# Patient Record
Sex: Female | Born: 1955 | Race: Black or African American | Hispanic: No | Marital: Married | Smoking: Never smoker
Health system: Southern US, Community
[De-identification: ages and names within clinical notes are randomized; demographics above are authoritative.]

## PROBLEM LIST (undated history)

## (undated) DIAGNOSIS — R51 Headache: Secondary | ICD-10-CM

## (undated) DIAGNOSIS — R011 Cardiac murmur, unspecified: Secondary | ICD-10-CM

## (undated) DIAGNOSIS — M549 Dorsalgia, unspecified: Secondary | ICD-10-CM

## (undated) DIAGNOSIS — M62838 Other muscle spasm: Secondary | ICD-10-CM

## (undated) DIAGNOSIS — R519 Headache, unspecified: Secondary | ICD-10-CM

## (undated) DIAGNOSIS — G8929 Other chronic pain: Secondary | ICD-10-CM

## (undated) HISTORY — PX: ABDOMINAL HYSTERECTOMY: SHX81

## (undated) HISTORY — DX: Dorsalgia, unspecified: M54.9

## (undated) HISTORY — DX: Other chronic pain: G89.29

## (undated) HISTORY — DX: Other muscle spasm: M62.838

## (undated) HISTORY — PX: BREAST CYST ASPIRATION: SHX578

## (undated) HISTORY — DX: Headache: R51

## (undated) HISTORY — DX: Headache, unspecified: R51.9

---

## 1998-07-27 ENCOUNTER — Other Ambulatory Visit: Admission: RE | Admit: 1998-07-27 | Discharge: 1998-07-27 | Payer: Self-pay | Admitting: Obstetrics and Gynecology

## 1998-12-09 ENCOUNTER — Encounter: Payer: Self-pay | Admitting: Internal Medicine

## 1998-12-09 ENCOUNTER — Ambulatory Visit (HOSPITAL_COMMUNITY): Admission: RE | Admit: 1998-12-09 | Discharge: 1998-12-09 | Payer: Self-pay | Admitting: Internal Medicine

## 1999-06-24 ENCOUNTER — Emergency Department (HOSPITAL_COMMUNITY): Admission: EM | Admit: 1999-06-24 | Discharge: 1999-06-24 | Payer: Self-pay | Admitting: Emergency Medicine

## 1999-06-25 ENCOUNTER — Encounter: Payer: Self-pay | Admitting: Emergency Medicine

## 1999-08-23 ENCOUNTER — Ambulatory Visit (HOSPITAL_COMMUNITY): Admission: RE | Admit: 1999-08-23 | Discharge: 1999-08-23 | Payer: Self-pay | Admitting: Obstetrics and Gynecology

## 1999-08-23 ENCOUNTER — Encounter: Payer: Self-pay | Admitting: Obstetrics and Gynecology

## 1999-08-30 ENCOUNTER — Other Ambulatory Visit: Admission: RE | Admit: 1999-08-30 | Discharge: 1999-08-30 | Payer: Self-pay | Admitting: Obstetrics and Gynecology

## 2000-09-11 ENCOUNTER — Other Ambulatory Visit: Admission: RE | Admit: 2000-09-11 | Discharge: 2000-09-11 | Payer: Self-pay | Admitting: Obstetrics and Gynecology

## 2000-10-21 ENCOUNTER — Emergency Department (HOSPITAL_COMMUNITY): Admission: EM | Admit: 2000-10-21 | Discharge: 2000-10-21 | Payer: Self-pay | Admitting: Emergency Medicine

## 2000-10-23 ENCOUNTER — Ambulatory Visit (HOSPITAL_COMMUNITY): Admission: RE | Admit: 2000-10-23 | Discharge: 2000-10-23 | Payer: Self-pay | Admitting: Obstetrics and Gynecology

## 2000-10-23 ENCOUNTER — Encounter: Payer: Self-pay | Admitting: Obstetrics and Gynecology

## 2000-10-25 ENCOUNTER — Encounter: Payer: Self-pay | Admitting: Obstetrics and Gynecology

## 2000-10-25 ENCOUNTER — Encounter: Admission: RE | Admit: 2000-10-25 | Discharge: 2000-10-25 | Payer: Self-pay | Admitting: Obstetrics and Gynecology

## 2001-01-22 ENCOUNTER — Encounter (INDEPENDENT_AMBULATORY_CARE_PROVIDER_SITE_OTHER): Payer: Self-pay | Admitting: *Deleted

## 2001-01-22 ENCOUNTER — Encounter: Payer: Self-pay | Admitting: Obstetrics and Gynecology

## 2001-01-22 ENCOUNTER — Other Ambulatory Visit: Admission: RE | Admit: 2001-01-22 | Discharge: 2001-01-22 | Payer: Self-pay | Admitting: Obstetrics and Gynecology

## 2001-01-22 ENCOUNTER — Encounter: Admission: RE | Admit: 2001-01-22 | Discharge: 2001-01-22 | Payer: Self-pay | Admitting: Obstetrics and Gynecology

## 2001-10-01 ENCOUNTER — Other Ambulatory Visit: Admission: RE | Admit: 2001-10-01 | Discharge: 2001-10-01 | Payer: Self-pay | Admitting: Obstetrics and Gynecology

## 2001-10-01 ENCOUNTER — Encounter: Payer: Self-pay | Admitting: Obstetrics and Gynecology

## 2001-10-01 ENCOUNTER — Encounter: Admission: RE | Admit: 2001-10-01 | Discharge: 2001-10-01 | Payer: Self-pay | Admitting: Obstetrics and Gynecology

## 2003-01-06 ENCOUNTER — Ambulatory Visit (HOSPITAL_COMMUNITY): Admission: RE | Admit: 2003-01-06 | Discharge: 2003-01-06 | Payer: Self-pay | Admitting: Family Medicine

## 2004-11-29 ENCOUNTER — Encounter: Admission: RE | Admit: 2004-11-29 | Discharge: 2004-11-29 | Payer: Self-pay | Admitting: Obstetrics and Gynecology

## 2005-10-19 ENCOUNTER — Ambulatory Visit (HOSPITAL_COMMUNITY): Admission: RE | Admit: 2005-10-19 | Discharge: 2005-10-19 | Payer: Self-pay | Admitting: Family Medicine

## 2007-01-31 ENCOUNTER — Ambulatory Visit (HOSPITAL_COMMUNITY): Admission: RE | Admit: 2007-01-31 | Discharge: 2007-01-31 | Payer: Self-pay | Admitting: Family Medicine

## 2008-02-12 ENCOUNTER — Ambulatory Visit (HOSPITAL_COMMUNITY): Admission: RE | Admit: 2008-02-12 | Discharge: 2008-02-12 | Payer: Self-pay | Admitting: Family Medicine

## 2009-01-28 ENCOUNTER — Ambulatory Visit (HOSPITAL_COMMUNITY): Admission: RE | Admit: 2009-01-28 | Discharge: 2009-01-28 | Payer: Self-pay | Admitting: Family Medicine

## 2010-01-06 ENCOUNTER — Emergency Department (HOSPITAL_COMMUNITY): Admission: EM | Admit: 2010-01-06 | Discharge: 2010-01-06 | Payer: Self-pay | Admitting: Emergency Medicine

## 2010-02-12 ENCOUNTER — Ambulatory Visit (HOSPITAL_COMMUNITY): Admission: RE | Admit: 2010-02-12 | Discharge: 2010-02-12 | Payer: Self-pay | Admitting: Family Medicine

## 2010-11-14 ENCOUNTER — Encounter: Payer: Self-pay | Admitting: Family Medicine

## 2011-05-12 ENCOUNTER — Emergency Department (HOSPITAL_COMMUNITY): Payer: 59

## 2011-05-12 ENCOUNTER — Emergency Department (HOSPITAL_COMMUNITY)
Admission: EM | Admit: 2011-05-12 | Discharge: 2011-05-12 | Disposition: A | Discharge status: Home / Self Care | Payer: 59 | Attending: Emergency Medicine | Admitting: Emergency Medicine

## 2011-05-12 DIAGNOSIS — R079 Chest pain, unspecified: Secondary | ICD-10-CM | POA: Insufficient documentation

## 2011-05-12 LAB — DIFFERENTIAL
Band Neutrophils: 0 % (ref 0–10)
Basophils Absolute: 0 10*3/uL (ref 0.0–0.1)
Basophils Relative: 0 % (ref 0–1)
Blasts: 0 %
Eosinophils Absolute: 0.1 10*3/uL (ref 0.0–0.7)
Eosinophils Relative: 1 % (ref 0–5)
Lymphocytes Relative: 38 % (ref 12–46)
Lymphs Abs: 2.4 10*3/uL (ref 0.7–4.0)
Metamyelocytes Relative: 0 %
Monocytes Absolute: 0.5 10*3/uL (ref 0.1–1.0)
Monocytes Relative: 8 % (ref 3–12)
Myelocytes: 0 %
Neutro Abs: 3.4 10*3/uL (ref 1.7–7.7)
Neutrophils Relative %: 53 % (ref 43–77)
Promyelocytes Absolute: 0 %
nRBC: 0 /100 WBC

## 2011-05-12 LAB — COMPREHENSIVE METABOLIC PANEL
ALT: 16 U/L (ref 0–35)
AST: 16 U/L (ref 0–37)
Albumin: 4.1 g/dL (ref 3.5–5.2)
Alkaline Phosphatase: 66 U/L (ref 39–117)
BUN: 23 mg/dL (ref 6–23)
CO2: 30 mEq/L (ref 19–32)
Calcium: 10 mg/dL (ref 8.4–10.5)
Chloride: 104 mEq/L (ref 96–112)
Creatinine, Ser: 0.77 mg/dL (ref 0.50–1.10)
GFR calc Af Amer: >60 mL/min (ref >60)
GFR calc non Af Amer: >60 mL/min (ref >60)
Glucose, Bld: 86 mg/dL (ref 70–99)
Potassium: 4.3 mEq/L (ref 3.5–5.1)
Sodium: 142 mEq/L (ref 135–145)
Total Bilirubin: 0.2 mg/dL — ABNORMAL LOW (ref 0.3–1.2)
Total Protein: 7.2 g/dL (ref 6.0–8.3)

## 2011-05-12 LAB — CBC
HCT: 41.3 % (ref 36.0–46.0)
Hemoglobin: 14.2 g/dL (ref 12.0–15.0)
MCH: 30.3 pg (ref 26.0–34.0)
MCHC: 34.4 g/dL (ref 30.0–36.0)
MCV: 88.2 fL (ref 78.0–100.0)
Platelets: 185 10*3/uL (ref 150–400)
RBC: 4.68 MIL/uL (ref 3.87–5.11)
RDW: 13.2 % (ref 11.5–15.5)
WBC: 6.4 10*3/uL (ref 4.0–10.5)

## 2011-05-12 LAB — TROPONIN I: Troponin I: <0.3 ng/mL (ref <0.30)

## 2011-05-12 LAB — CK TOTAL AND CKMB (NOT AT ARMC)
CK, MB: 2 ng/mL (ref 0.3–4.0)
Relative Index: 1.6 (ref 0.0–2.5)
Total CK: 124 U/L (ref 7–177)

## 2011-10-06 ENCOUNTER — Other Ambulatory Visit (HOSPITAL_COMMUNITY)
Admission: RE | Admit: 2011-10-06 | Discharge: 2011-10-06 | Disposition: A | Discharge status: Home / Self Care | Payer: 59 | Source: Ambulatory Visit | Attending: Family Medicine | Admitting: Family Medicine

## 2011-10-06 DIAGNOSIS — R87810 Cervical high risk human papillomavirus (HPV) DNA test positive: Secondary | ICD-10-CM | POA: Insufficient documentation

## 2011-10-06 DIAGNOSIS — Z01419 Encounter for gynecological examination (general) (routine) without abnormal findings: Secondary | ICD-10-CM | POA: Insufficient documentation

## 2012-02-18 ENCOUNTER — Emergency Department (HOSPITAL_COMMUNITY)
Admission: EM | Admit: 2012-02-18 | Discharge: 2012-02-18 | Disposition: A | Discharge status: Home / Self Care | Payer: 59 | Attending: Emergency Medicine | Admitting: Emergency Medicine

## 2012-02-18 ENCOUNTER — Encounter (HOSPITAL_COMMUNITY): Payer: Self-pay | Admitting: Physical Medicine and Rehabilitation

## 2012-02-18 ENCOUNTER — Emergency Department (HOSPITAL_COMMUNITY): Payer: 59

## 2012-02-18 DIAGNOSIS — R079 Chest pain, unspecified: Secondary | ICD-10-CM | POA: Insufficient documentation

## 2012-02-18 DIAGNOSIS — R011 Cardiac murmur, unspecified: Secondary | ICD-10-CM | POA: Insufficient documentation

## 2012-02-18 DIAGNOSIS — R0602 Shortness of breath: Secondary | ICD-10-CM | POA: Insufficient documentation

## 2012-02-18 HISTORY — DX: Cardiac murmur, unspecified: R01.1

## 2012-02-18 LAB — CBC
HCT: 38.3 % (ref 36.0–46.0)
Hemoglobin: 13.2 g/dL (ref 12.0–15.0)
MCH: 30.6 pg (ref 26.0–34.0)
MCHC: 34.5 g/dL (ref 30.0–36.0)
MCV: 88.7 fL (ref 78.0–100.0)
Platelets: 184 10*3/uL (ref 150–400)
RBC: 4.32 MIL/uL (ref 3.87–5.11)
RDW: 13.5 % (ref 11.5–15.5)
WBC: 6.8 10*3/uL (ref 4.0–10.5)

## 2012-02-18 LAB — BASIC METABOLIC PANEL
BUN: 18 mg/dL (ref 6–23)
CO2: 28 mEq/L (ref 19–32)
Calcium: 9.8 mg/dL (ref 8.4–10.5)
Chloride: 103 mEq/L (ref 96–112)
Creatinine, Ser: 0.84 mg/dL (ref 0.50–1.10)
GFR calc Af Amer: 89 mL/min — ABNORMAL LOW (ref >90)
GFR calc non Af Amer: 77 mL/min — ABNORMAL LOW (ref >90)
Glucose, Bld: 96 mg/dL (ref 70–99)
Potassium: 4.3 mEq/L (ref 3.5–5.1)
Sodium: 139 mEq/L (ref 135–145)

## 2012-02-18 LAB — POCT I-STAT TROPONIN I
Troponin i, poc: 0 ng/mL (ref 0.00–0.08)
Troponin i, poc: 0 ng/mL (ref 0.00–0.08)

## 2012-02-18 MED ORDER — GI COCKTAIL ~~LOC~~
30.0000 mL | Freq: Once | ORAL | Status: AC
  Administered 2012-02-18: 30 mL via ORAL
  Filled 2012-02-18: qty 30

## 2012-02-18 MED ORDER — ASPIRIN 81 MG PO CHEW
324.0000 mg | CHEWABLE_TABLET | Freq: Once | ORAL | Status: AC
  Administered 2012-02-18: 243 mg via ORAL
  Filled 2012-02-18: qty 4

## 2012-02-18 NOTE — ED Notes (Signed)
Patient transported to X-ray 

## 2012-02-18 NOTE — ED Provider Notes (Signed)
History     CSN: [id number]  Arrival date & time 02/18/12  1657   First MD Initiated Contact with Patient 02/18/12 1807      Chief Complaint  Patient presents with  . Chest Pain  . Shortness of Breath    (Consider location/radiation/quality/duration/timing/severity/associated sxs/prior treatment) Patient is a 56 y.o. female presenting with chest pain. The history is provided by the patient and a significant other.  Chest Pain The chest pain began 6 - 12 hours ago. Chest pain occurs frequently. The chest pain is resolved (no pain currently, but numerous episodes while in ED). Associated with: no aggravating factors. The pain is currently at 0/10. The quality of the pain is described as brief, sharp and dull. The pain does not radiate. Exacerbated by: no aggravating factors, also no alleviating factors. Primary symptoms include shortness of breath and nausea. Pertinent negatives for primary symptoms include no fever, no fatigue, no syncope, no cough, no palpitations, no abdominal pain, no vomiting and no dizziness.  Pertinent negatives for associated symptoms include no lower extremity edema and no orthopnea. She tried nothing for the symptoms. Risk factors include no known risk factors.  Pertinent negatives for past medical history include no CAD, no diabetes, no DVT, no hypertension and no MI.  Pertinent negatives for family medical history include: no early MI in family.     Past Medical History  Diagnosis Date  . Murmur     No past surgical history on file.  No family history on file.  History  Substance Use Topics  . Smoking status: Never Smoker   . Smokeless tobacco: Not on file  . Alcohol Use: No    OB History    Grav Para Term Preterm Abortions TAB SAB Ect Mult Living                  Review of Systems  Constitutional: Negative for fever and fatigue.  HENT: Negative for neck pain.   Respiratory: Positive for shortness of breath. Negative for cough.     Cardiovascular: Positive for chest pain. Negative for palpitations, orthopnea and syncope.  Gastrointestinal: Positive for nausea. Negative for vomiting and abdominal pain.  Neurological: Negative for dizziness.  All other systems reviewed and are negative.    Allergies  Review of patient's allergies indicates no known allergies.  Home Medications   Current Outpatient Rx  Name Route Sig Dispense Refill  . ASPIRIN EC 81 MG PO TBEC Oral Take 81 mg by mouth daily.    Marland Kitchen VITAMIN D-3 5000 UNITS PO TABS Oral Take 2 tablets by mouth daily.    Marland Kitchen CO-ENZYME Q-10 50 MG PO CAPS Oral Take 50 mg by mouth daily.    Marland Kitchen ECHINACEA PO Oral Take 1 tablet by mouth daily.    . OMEGA-3 FATTY ACIDS 1000 MG PO CAPS Oral Take 2 g by mouth daily.    . ADULT MULTIVITAMIN W/MINERALS CH Oral Take 1 tablet by mouth daily.      BP 123/77  Pulse 69  Temp(Src) 98.4 F (36.9 C) (Oral)  Resp 18  SpO2 99%  Physical Exam  Nursing note and vitals reviewed. Constitutional: She is oriented to person, place, and time. She appears well-developed and well-nourished.  HENT:  Head: Normocephalic and atraumatic.  Eyes: EOM are normal.  Neck: Normal range of motion.  Cardiovascular: Normal rate, regular rhythm, normal heart sounds and intact distal pulses.   Pulmonary/Chest: Effort normal and breath sounds normal. No respiratory distress.  Abdominal: Soft.  There is no tenderness.  Musculoskeletal: Normal range of motion. She exhibits no edema and no tenderness.  Neurological: She is alert and oriented to person, place, and time.  Skin: Skin is warm and dry.  Psychiatric: She has a normal mood and affect.    ED Course  Procedures (including critical care time)  Date: 02/18/2012  Rate: 70  Rhythm: normal sinus rhythm  QRS Axis: normal  Intervals: normal  ST/T Wave abnormalities: normal  Conduction Disutrbances:none  Narrative Interpretation:   Old EKG Reviewed: unchanged   Date: 02/18/2012, 2136  Rate:64   Rhythm: normal sinus rhythm  QRS Axis: normal  Intervals: normal  ST/T Wave abnormalities: normal  Conduction Disutrbances:none  Narrative Interpretation:   Old EKG Reviewed: unchanged    Results for orders placed during the hospital encounter of 02/18/12  CBC      Component Value Range   WBC 6.8  4.0 - 10.5 (K/uL)   RBC 4.32  3.87 - 5.11 (MIL/uL)   Hemoglobin 13.2  12.0 - 15.0 (g/dL)   HCT 16.1  09.6 - 04.5 (%)   MCV 88.7  78.0 - 100.0 (fL)   MCH 30.6  26.0 - 34.0 (pg)   MCHC 34.5  30.0 - 36.0 (g/dL)   RDW 40.9  81.1 - 91.4 (%)   Platelets 184  150 - 400 (K/uL)  BASIC METABOLIC PANEL      Component Value Range   Sodium 139  135 - 145 (mEq/L)   Potassium 4.3  3.5 - 5.1 (mEq/L)   Chloride 103  96 - 112 (mEq/L)   CO2 28  19 - 32 (mEq/L)   Glucose, Bld 96  70 - 99 (mg/dL)   BUN 18  6 - 23 (mg/dL)   Creatinine, Ser 7.82  0.50 - 1.10 (mg/dL)   Calcium 9.8  8.4 - 95.6 (mg/dL)   GFR calc non Af Amer 77 (*) >90 (mL/min)   GFR calc Af Amer 89 (*) >90 (mL/min)  POCT I-STAT TROPONIN I      Component Value Range   Troponin i, poc 0.00  0.00 - 0.08 (ng/mL)   Comment 3           POCT I-STAT TROPONIN I      Component Value Range   Troponin i, poc 0.00  0.00 - 0.08 (ng/mL)   Comment 3             Labs Reviewed  BASIC METABOLIC PANEL - Abnormal; Notable for the following:    GFR calc non Af Amer 77 (*)    GFR calc Af Amer 89 (*)    All other components within normal limits  CBC  POCT I-STAT TROPONIN I  POCT I-STAT TROPONIN I  .Dg Chest 2 View  02/18/2012  *RADIOLOGY REPORT*  Clinical Data: Chest pain since the morning  CHEST - 2 VIEW  Comparison: 05/12/2011  Findings: Normal heart size.  Clear lungs.  No pneumothorax.  No pleural effusion.  Intact thoracic spine.  IMPRESSION: No active cardiopulmonary disease.  Original Report Authenticated By: Donavan Burnet, M.D.     1. Chest pain       MDM  Patient is a female in her 73s who presents with atypical chest pain. She  states she woke up this morning with an intermittent dull and sharp (alternating) chest pain. It lasts several seconds and then goes away. She has multiple episodes every hour. Do not occur specifically with exertion. She states she's been resting all day  and they have still been happening. She does have intermittent shortness of breath with the episodes. She denies any pleuritic component to pain. Denies any lower extremity swelling or pain. She does report several episodes of burping and feeling of indigestion. My evaluation here the patient has a normal exam. She has no cardiac risk factors, though is TIMI 1 based off aspirin use as directed by her PCP.  Initial Trop normal. EKG benign. Will check 2h delta trop.    Repeat troponin also negative. Repeat EKG remained benign. Feel comfortable ruling out infarction. Have low suspicion for acute coronary syndrome/unstable angina based off history. Also low suspicion for pulmonary embolus, aortic dissection, esophageal rupture, pneumothorax, pneumonia. Recommend close followup with PCP and cardiology for consideration of outpatient stress.      Donnamarie Poag, MD 02/18/12 [phone]

## 2012-02-18 NOTE — ED Notes (Signed)
Patient is AOx4 and comfortable with her discharge instructions. 

## 2012-02-18 NOTE — ED Provider Notes (Signed)
I saw and evaluated the patient, reviewed the resident's note and I agree with the findings and plan.  I personally evaluated the ECG and agree with the interpretation of the resident  Atypical chest pain.  My suspicion for ACS is very low.  EKG is within normal limits.  Cardiology and PCP followup.  She understands to return to the ER for new or worsening symptoms  Lyanne Co, MD 02/18/12 [phone]

## 2012-02-18 NOTE — ED Notes (Signed)
Pt presents to department for evaluation of midsternal chest pain radiating to bilateral arms. Onset this morning after waking up. Also states SOB. 3/10 intermittent pain at the time. Respirations unlabored. Pt conscious alert and oriented x4. Nothing makes pain worse.

## 2012-08-23 ENCOUNTER — Ambulatory Visit (HOSPITAL_COMMUNITY)
Admission: RE | Admit: 2012-08-23 | Discharge: 2012-08-23 | Disposition: A | Discharge status: Home / Self Care | Payer: 59 | Source: Ambulatory Visit | Attending: *Deleted | Admitting: *Deleted

## 2012-08-23 ENCOUNTER — Other Ambulatory Visit (HOSPITAL_COMMUNITY): Payer: Self-pay | Admitting: *Deleted

## 2012-08-23 ENCOUNTER — Ambulatory Visit (HOSPITAL_COMMUNITY)
Admission: AD | Admit: 2012-08-23 | Discharge: 2012-08-23 | Disposition: A | Discharge status: Home / Self Care | Payer: 59 | Source: Ambulatory Visit | Attending: *Deleted | Admitting: *Deleted

## 2012-08-23 DIAGNOSIS — M25559 Pain in unspecified hip: Secondary | ICD-10-CM

## 2012-08-23 DIAGNOSIS — M545 Low back pain, unspecified: Secondary | ICD-10-CM

## 2014-05-12 ENCOUNTER — Ambulatory Visit: Payer: BC Managed Care – PPO

## 2014-05-28 ENCOUNTER — Encounter: Payer: Self-pay | Admitting: Family Medicine

## 2015-11-26 ENCOUNTER — Other Ambulatory Visit (HOSPITAL_COMMUNITY): Payer: Self-pay | Admitting: *Deleted

## 2015-11-26 DIAGNOSIS — Z1231 Encounter for screening mammogram for malignant neoplasm of breast: Secondary | ICD-10-CM

## 2015-12-07 ENCOUNTER — Ambulatory Visit (HOSPITAL_COMMUNITY): Payer: Self-pay

## 2015-12-09 ENCOUNTER — Ambulatory Visit (HOSPITAL_COMMUNITY)
Admission: RE | Admit: 2015-12-09 | Discharge: 2015-12-09 | Disposition: A | Discharge status: Home / Self Care | Payer: PRIVATE HEALTH INSURANCE | Source: Ambulatory Visit | Attending: *Deleted | Admitting: *Deleted

## 2015-12-09 DIAGNOSIS — Z1231 Encounter for screening mammogram for malignant neoplasm of breast: Secondary | ICD-10-CM

## 2015-12-17 ENCOUNTER — Ambulatory Visit (HOSPITAL_COMMUNITY): Payer: Self-pay

## 2015-12-21 ENCOUNTER — Ambulatory Visit (HOSPITAL_COMMUNITY): Payer: Self-pay

## 2016-09-13 ENCOUNTER — Telehealth: Payer: Self-pay | Admitting: Family Medicine

## 2016-09-13 ENCOUNTER — Encounter: Payer: Self-pay | Admitting: Family Medicine

## 2016-09-13 ENCOUNTER — Ambulatory Visit: Payer: Self-pay | Attending: Family Medicine | Admitting: Family Medicine

## 2016-09-13 VITALS — BP 120/77 | HR 74 | Temp 98.1°F | Ht 66.0 in | Wt 214.6 lb

## 2016-09-13 DIAGNOSIS — M25519 Pain in unspecified shoulder: Secondary | ICD-10-CM | POA: Insufficient documentation

## 2016-09-13 DIAGNOSIS — Z1389 Encounter for screening for other disorder: Secondary | ICD-10-CM | POA: Insufficient documentation

## 2016-09-13 DIAGNOSIS — Z7982 Long term (current) use of aspirin: Secondary | ICD-10-CM | POA: Insufficient documentation

## 2016-09-13 DIAGNOSIS — Z13228 Encounter for screening for other metabolic disorders: Secondary | ICD-10-CM

## 2016-09-13 DIAGNOSIS — M25571 Pain in right ankle and joints of right foot: Secondary | ICD-10-CM | POA: Insufficient documentation

## 2016-09-13 DIAGNOSIS — M25511 Pain in right shoulder: Secondary | ICD-10-CM | POA: Insufficient documentation

## 2016-09-13 DIAGNOSIS — H9201 Otalgia, right ear: Secondary | ICD-10-CM | POA: Insufficient documentation

## 2016-09-13 DIAGNOSIS — M25579 Pain in unspecified ankle and joints of unspecified foot: Secondary | ICD-10-CM | POA: Insufficient documentation

## 2016-09-13 LAB — COMPLETE METABOLIC PANEL WITH GFR
ALT: 15 U/L (ref 6–29)
AST: 16 U/L (ref 10–35)
Albumin: 4.1 g/dL (ref 3.6–5.1)
Alkaline Phosphatase: 61 U/L (ref 33–130)
BUN: 11 mg/dL (ref 7–25)
CO2: 27 mmol/L (ref 20–31)
Calcium: 9.2 mg/dL (ref 8.6–10.4)
Chloride: 105 mmol/L (ref 98–110)
Creat: 0.82 mg/dL (ref 0.50–0.99)
GFR, Est African American: >89 mL/min (ref >=60)
GFR, Est Non African American: 78 mL/min (ref >=60)
Glucose, Bld: 88 mg/dL (ref 65–99)
Potassium: 3.9 mmol/L (ref 3.5–5.3)
Sodium: 140 mmol/L (ref 135–146)
Total Bilirubin: 0.5 mg/dL (ref 0.2–1.2)
Total Protein: 6.4 g/dL (ref 6.1–8.1)

## 2016-09-13 LAB — LIPID PANEL
Cholesterol: 197 mg/dL (ref <200)
HDL: 56 mg/dL (ref >50)
LDL Cholesterol: 129 mg/dL — ABNORMAL HIGH (ref <100)
Total CHOL/HDL Ratio: 3.5 Ratio (ref <5.0)
Triglycerides: 62 mg/dL (ref <150)
VLDL: 12 mg/dL (ref <30)

## 2016-09-13 LAB — CBC WITH DIFFERENTIAL/PLATELET
Basophils Absolute: 0 cells/uL (ref 0–200)
Basophils Relative: 0 %
Eosinophils Absolute: 56 cells/uL (ref 15–500)
Eosinophils Relative: 1 %
HCT: 40.4 % (ref 35.0–45.0)
Hemoglobin: 13.7 g/dL (ref 11.7–15.5)
Lymphocytes Relative: 35 %
Lymphs Abs: 1960 cells/uL (ref 850–3900)
MCH: 30 pg (ref 27.0–33.0)
MCHC: 33.9 g/dL (ref 32.0–36.0)
MCV: 88.6 fL (ref 80.0–100.0)
MPV: 10.6 fL (ref 7.5–12.5)
Monocytes Absolute: 448 cells/uL (ref 200–950)
Monocytes Relative: 8 %
Neutro Abs: 3136 cells/uL (ref 1500–7800)
Neutrophils Relative %: 56 %
Platelets: 199 10*3/uL (ref 140–400)
RBC: 4.56 MIL/uL (ref 3.80–5.10)
RDW: 14.3 % (ref 11.0–15.0)
WBC: 5.6 10*3/uL (ref 3.8–10.8)

## 2016-09-13 MED ORDER — CIPROFLOXACIN-DEXAMETHASONE 0.3-0.1 % OT SUSP: 4.0000 [drp] | Freq: Two times a day (BID) | OTIC | 0 refills | Status: DC

## 2016-09-13 MED ORDER — MELOXICAM 7.5 MG PO TABS: 7.5000 mg | ORAL_TABLET | Freq: Every day | ORAL | 1 refills | Status: DC

## 2016-09-13 MED ORDER — ANTIPYRINE-BENZOCAINE 5.4-1.4 % OT SOLN: 3.0000 [drp] | OTIC | 0 refills | Status: DC | PRN

## 2016-09-13 MED FILL — CIPRODEX OTIC SUSPENSION: 0.3-0.1 | 10 days supply | Qty: 8 | Fill #0

## 2016-09-13 MED FILL — ?MELOXICAM 7.5 MG TABLET: 7.5 | 30 days supply | Qty: 30 | Fill #0

## 2016-09-13 NOTE — Telephone Encounter (Signed)
Patient came in stated pharmacy did not give fill Rx   ciprofloxacin-dexamethasone (CIPRODEX) otic suspension   Patient was informed sent to [facility]  Per patient wanted RX sent to [facility]   Please follow up with patient

## 2016-09-13 NOTE — Progress Notes (Signed)
Subjective:  Patient ID: Megan Allen, female    DOB: 1956-04-26  Age: 60 y.o. MRN: [id number]  CC: Shoulder Pain and Ear Pain   HPI Megan Allen presents To establish care. She complains of right shoulder pain and right ankle pain emphysema and she took a fall 2 years ago and fell on her right side. Right ankle pain shoots from the ankle to the leg and she describes both pain as throbbing. Uses Tylenol OTC with minimal relief in symptoms.  Also complains of right ear "bothering her". She has also noticed minimal hearing loss. Denies fever, discharge from the ear. Endorses some sinus fullness.  Past Medical History:  Diagnosis Date  . Murmur     Past Surgical History:  Procedure Laterality Date  . ABDOMINAL HYSTERECTOMY     partial    No Known Allergies   Outpatient Medications Prior to Visit  Medication Sig Dispense Refill  . aspirin EC 81 MG tablet Take 81 mg by mouth daily.    . orphenadrine (NORFLEX) 100 MG tablet Take 100 mg by mouth as needed for muscle spasms.     No facility-administered medications prior to visit.     ROS Review of Systems  Constitutional: Negative for activity change, appetite change and fatigue.  HENT: Positive for ear pain. Negative for congestion, sinus pressure and sore throat.   Eyes: Negative for visual disturbance.  Respiratory: Negative for cough, chest tightness, shortness of breath and wheezing.   Cardiovascular: Negative for chest pain and palpitations.  Gastrointestinal: Negative for abdominal distention, abdominal pain and constipation.  Endocrine: Negative for polydipsia.  Genitourinary: Negative for dysuria and frequency.  Musculoskeletal: Negative for arthralgias and back pain.       R shoulder and R ankle pain  Skin: Negative for rash.  Neurological: Negative for tremors, light-headedness and numbness.  Hematological: Does not bruise/bleed easily.  Psychiatric/Behavioral: Negative for agitation and behavioral problems.      Objective:  BP 120/77 (BP Location: Right Arm, Patient Position: Sitting, Cuff Size: Normal)   Pulse 74   Temp 98.1 F (36.7 C) (Oral)   Ht 5\' 6"  (1.676 m)   Wt 214 lb 9.6 oz (97.3 kg)   SpO2 98%   BMI 34.64 kg/m   BP/Weight 09/13/2016 0000000  Systolic BP 123456 123456  Diastolic BP 77 72  Wt. (Lbs) 214.6 -  BMI 34.64 -      Physical Exam  Constitutional: She is oriented to person, place, and time. She appears well-developed and well-nourished.  HENT:  Left Ear: External ear normal.  Minimal sclerosis of the right eardrum  Cardiovascular: Normal rate, normal heart sounds and intact distal pulses.   No murmur heard. Pulmonary/Chest: Effort normal and breath sounds normal. She has no wheezes. She has no rales. She exhibits no tenderness.  Abdominal: Soft. Bowel sounds are normal. She exhibits no distension and no mass. There is no tenderness.  Musculoskeletal: Normal range of motion.  Full range of motion in right shoulder and right ankle joints as well as other joints. Minimal tenderness ellicited on range of motion of the shoulder; no tenderness on range of motion of the ankle  Neurological: She is alert and oriented to person, place, and time.     Assessment & Plan:   1. Screening for metabolic disorder - COMPLETE METABOLIC PANEL WITH GFR - Lipid panel - CBC with Differential/Platelet - VITAMIN D 25 Hydroxy (Vit-D Deficiency, Fractures)  2. Pain in joint of right shoulder Cannot exclude underlying osteoarthritis  even though she dates this back to a fall. - meloxicam (MOBIC) 7.5 MG tablet; Take 1 tablet (7.5 mg total) by mouth daily.  Dispense: 30 tablet; Refill: 1  3. Pain in joint involving right ankle and foot Cannot exclude underlying osteoarthritis - meloxicam (MOBIC) 7.5 MG tablet; Take 1 tablet (7.5 mg total) by mouth daily.  Dispense: 30 tablet; Refill: 1  4. Right ear pain Advised to use OTC antihistamines as well No evidence of infection -  antipyrine-benzocaine (AURALGAN) otic solution; Place 3-4 drops into the right ear every 2 (two) hours as needed for ear pain.  Dispense: 10 mL; Refill: 0   Meds ordered this encounter  Medications  . meloxicam (MOBIC) 7.5 MG tablet    Sig: Take 1 tablet (7.5 mg total) by mouth daily.    Dispense:  30 tablet    Refill:  1  . antipyrine-benzocaine (AURALGAN) otic solution    Sig: Place 3-4 drops into the right ear every 2 (two) hours as needed for ear pain.    Dispense:  10 mL    Refill:  0    Follow-up: Return in about 6 weeks (around 10/25/2016) for Follow-up on shoulder and ankle pain.   Arnoldo Morale MD

## 2016-09-13 NOTE — Addendum Note (Signed)
Addended by: Arnoldo Morale on: 09/13/2016 12:41 PM   Modules accepted: Orders

## 2016-09-13 NOTE — Progress Notes (Signed)
Complains of nagging shoulder pain that radiates to back as well as pain in ear Taking multivitamin, calcium, and vitamin D

## 2016-09-13 NOTE — Telephone Encounter (Signed)
Sent otic prescription to [facility] pharmacy.

## 2016-09-14 LAB — VITAMIN D 25 HYDROXY (VIT D DEFICIENCY, FRACTURES): Vit D, 25-Hydroxy: 29 ng/mL — ABNORMAL LOW (ref 30–100)

## 2016-09-19 ENCOUNTER — Telehealth: Payer: Self-pay

## 2016-09-19 NOTE — Telephone Encounter (Signed)
-----   Message from Arnoldo Morale, MD sent at 09/14/2016  3:52 PM EST ----- Normal cholesterol, slightly decreased vitamin D level. Advised to take OTC vitamin D capsules

## 2016-09-19 NOTE — Telephone Encounter (Signed)
Writer called patient per Dr. Jarold Song regarding her labs.  Patient stated the need to start on Vit. D

## 2016-09-28 ENCOUNTER — Telehealth: Payer: Self-pay | Admitting: Family Medicine

## 2016-09-28 NOTE — Telephone Encounter (Signed)
Patient called the office to speak with PCP regarding her medication. Please follow up.   Thank you.

## 2016-09-30 NOTE — Telephone Encounter (Signed)
Writer returned patient's call to try and get more infromation about what medications she was referring to.  Writer encouraged patient to call back.

## 2016-10-04 ENCOUNTER — Telehealth: Payer: Self-pay | Admitting: Family Medicine

## 2016-10-04 NOTE — Telephone Encounter (Signed)
Msg routed to Dr. Jarold Song.

## 2016-10-04 NOTE — Telephone Encounter (Signed)
Patient called the office to speak with PCP regarding the medication reaction that she is currently experiencing when taking meloxicam (MOBIC) 7.5 MG tablet.  Pt stated that she is experiencing a burning sensation in her stomach as well as new back pain. Please follow up.   Thank you.

## 2016-10-05 NOTE — Telephone Encounter (Signed)
She could discontinue the meloxicam if she has abdominal symptoms. She will need to be evaluated for her back pain.

## 2016-10-11 ENCOUNTER — Telehealth: Payer: Self-pay

## 2016-10-11 NOTE — Telephone Encounter (Signed)
Writer called patient and LVM for patient to call and schedule an appt with MD to evaluate the pain in her back if it has continued.

## 2016-10-13 ENCOUNTER — Ambulatory Visit: Payer: Self-pay | Attending: Family Medicine | Admitting: Family Medicine

## 2016-10-13 ENCOUNTER — Encounter: Payer: Self-pay | Admitting: Family Medicine

## 2016-10-13 VITALS — BP 106/70 | HR 73 | Temp 97.6°F | Ht 66.5 in | Wt 215.6 lb

## 2016-10-13 DIAGNOSIS — Z79899 Other long term (current) drug therapy: Secondary | ICD-10-CM | POA: Insufficient documentation

## 2016-10-13 DIAGNOSIS — H6121 Impacted cerumen, right ear: Secondary | ICD-10-CM

## 2016-10-13 DIAGNOSIS — G8929 Other chronic pain: Secondary | ICD-10-CM

## 2016-10-13 DIAGNOSIS — H9201 Otalgia, right ear: Secondary | ICD-10-CM | POA: Insufficient documentation

## 2016-10-13 DIAGNOSIS — M545 Low back pain, unspecified: Secondary | ICD-10-CM

## 2016-10-13 DIAGNOSIS — M25511 Pain in right shoulder: Secondary | ICD-10-CM | POA: Insufficient documentation

## 2016-10-13 DIAGNOSIS — M25571 Pain in right ankle and joints of right foot: Secondary | ICD-10-CM | POA: Insufficient documentation

## 2016-10-13 DIAGNOSIS — Z7982 Long term (current) use of aspirin: Secondary | ICD-10-CM | POA: Insufficient documentation

## 2016-10-13 DIAGNOSIS — M549 Dorsalgia, unspecified: Secondary | ICD-10-CM | POA: Insufficient documentation

## 2016-10-13 MED ORDER — TRAMADOL HCL 50 MG PO TABS: 50.0000 mg | ORAL_TABLET | Freq: Two times a day (BID) | ORAL | 1 refills | Status: DC | PRN

## 2016-10-13 MED ORDER — METHOCARBAMOL 750 MG PO TABS: 750.0000 mg | ORAL_TABLET | Freq: Two times a day (BID) | ORAL | 1 refills | Status: DC | PRN

## 2016-10-13 MED FILL — traMADol HCL 50 MG TABS: 50 | 30 days supply | Qty: 60 | Fill #0

## 2016-10-13 MED FILL — METHOCARBAMOL 750 MG TABLET: 750 | 30 days supply | Qty: 60 | Fill #0

## 2016-10-13 NOTE — Progress Notes (Signed)
Subjective:  Patient ID: Megan Allen, female    DOB: February 02, 1956  Age: 60 y.o. MRN: [id number]  CC: Back Pain; Shoulder Pain (right side); Ankle Pain; and Otalgia (check- right side)   HPI Megan Allen presents for follow-up of right shoulder and right ankle pain for which I have placed her on meloxicam which she complains caused epigastric pain and so she stopped it. She complains of low back pain which does not radiate and denies heavy lifting. Pain is rated at a 6-7/10. Pain is worse in her right ankle especially when she performs the flexion and extension motion associated with driving.  She was also treated for right otalgia at her last visit and reports improvement.  Past Medical History:  Diagnosis Date  . Murmur     Past Surgical History:  Procedure Laterality Date  . ABDOMINAL HYSTERECTOMY     partial      Outpatient Medications Prior to Visit  Medication Sig Dispense Refill  . aspirin EC 81 MG tablet Take 81 mg by mouth daily.    . ciprofloxacin-dexamethasone (CIPRODEX) otic suspension Place 4 drops into the right ear 2 (two) times daily. 7.5 mL 0  . meloxicam (MOBIC) 7.5 MG tablet Take 1 tablet (7.5 mg total) by mouth daily. (Patient not taking: Reported on 10/13/2016) 30 tablet 1  . orphenadrine (NORFLEX) 100 MG tablet Take 100 mg by mouth as needed for muscle spasms.     No facility-administered medications prior to visit.     ROS Review of Systems  Constitutional: Negative for activity change and appetite change.  HENT: Negative for sinus pressure and sore throat.   Respiratory: Negative for chest tightness, shortness of breath and wheezing.   Cardiovascular: Negative for chest pain and palpitations.  Gastrointestinal: Negative for abdominal distention, abdominal pain and constipation.  Genitourinary: Negative.   Musculoskeletal:       See hpi  Psychiatric/Behavioral: Negative for behavioral problems and dysphoric mood.    Objective:  BP 106/70 (BP  Location: Right Arm, Patient Position: Sitting, Cuff Size: Large)   Pulse 73   Temp 97.6 F (36.4 C) (Oral)   Ht 5' 6.5" (1.689 m)   Wt 215 lb 9.6 oz (97.8 kg)   SpO2 99%   BMI 34.28 kg/m   BP/Weight 10/13/2016 09/13/2016 0000000  Systolic BP [id number] 123456 123456  Diastolic BP 70 77 72  Wt. (Lbs) 215.6 214.6 -  BMI 34.28 34.64 -      Physical Exam  Constitutional: She is oriented to person, place, and time. She appears well-developed and well-nourished.  Cardiovascular: Normal rate, normal heart sounds and intact distal pulses.   No murmur heard. Pulmonary/Chest: Effort normal and breath sounds normal. She has no wheezes. She has no rales. She exhibits no tenderness.  Abdominal: Soft. Bowel sounds are normal. She exhibits no distension and no mass. There is no tenderness.  Musculoskeletal: Normal range of motion. She exhibits tenderness (Slight tenderness on range of motion of right shoulder; left shoulder is normal).  Right ankle is normal on range of motion, no tenderness elicited. Left ankle is normal  Neurological: She is alert and oriented to person, place, and time.     Assessment & Plan:   1. Pain in joint of right shoulder Advised to apply ice Continue range of motion exercises at home  2. Pain in joint involving right ankle and foot Unable to tolerate Mobic due to ?gastritis - traMADol (ULTRAM) 50 MG tablet; Take 1 tablet (50 mg total) by  mouth every 12 (twelve) hours as needed.  Dispense: 60 tablet; Refill: 1  3. Cerumen debris on tympanic membrane of right ear Patient advised to use OTC ceruminolytics  4. Chronic midline low back pain without sciatica Advised of sedating side effects of muscle relaxants - methocarbamol (ROBAXIN-750) 750 MG tablet; Take 1 tablet (750 mg total) by mouth 2 (two) times daily as needed for muscle spasms.  Dispense: 60 tablet; Refill: 1   Meds ordered this encounter  Medications  . traMADol (ULTRAM) 50 MG tablet    Sig: Take 1  tablet (50 mg total) by mouth every 12 (twelve) hours as needed.    Dispense:  60 tablet    Refill:  1  . methocarbamol (ROBAXIN-750) 750 MG tablet    Sig: Take 1 tablet (750 mg total) by mouth 2 (two) times daily as needed for muscle spasms.    Dispense:  60 tablet    Refill:  1    Follow-up: Return in about 3 months (around 01/11/2017) for Follow-up on joint pains.   Arnoldo Morale MD

## 2016-10-13 NOTE — Progress Notes (Signed)
meloxicam "gives me pain in my side and makes my stomach burn"

## 2016-12-14 ENCOUNTER — Ambulatory Visit: Payer: BLUE CROSS/BLUE SHIELD | Attending: Family Medicine | Admitting: Family Medicine

## 2016-12-14 ENCOUNTER — Ambulatory Visit (HOSPITAL_COMMUNITY)
Admission: RE | Admit: 2016-12-14 | Discharge: 2016-12-14 | Disposition: A | Discharge status: Home / Self Care | Payer: BLUE CROSS/BLUE SHIELD | Source: Ambulatory Visit | Attending: Family Medicine | Admitting: Family Medicine

## 2016-12-14 VITALS — BP 110/74 | HR 76 | Temp 98.3°F | Ht 66.5 in | Wt 211.2 lb

## 2016-12-14 DIAGNOSIS — M5137 Other intervertebral disc degeneration, lumbosacral region: Secondary | ICD-10-CM | POA: Diagnosis not present

## 2016-12-14 DIAGNOSIS — Z7982 Long term (current) use of aspirin: Secondary | ICD-10-CM | POA: Diagnosis not present

## 2016-12-14 DIAGNOSIS — G8929 Other chronic pain: Secondary | ICD-10-CM

## 2016-12-14 DIAGNOSIS — H6123 Impacted cerumen, bilateral: Secondary | ICD-10-CM | POA: Diagnosis not present

## 2016-12-14 DIAGNOSIS — R200 Anesthesia of skin: Secondary | ICD-10-CM | POA: Insufficient documentation

## 2016-12-14 DIAGNOSIS — M545 Low back pain, unspecified: Secondary | ICD-10-CM

## 2016-12-14 DIAGNOSIS — Z79899 Other long term (current) drug therapy: Secondary | ICD-10-CM | POA: Diagnosis not present

## 2016-12-14 MED ORDER — CARBAMIDE PEROXIDE 6.5 % OT SOLN: 5.0000 [drp] | Freq: Two times a day (BID) | OTIC | 0 refills | Status: DC

## 2016-12-14 MED ORDER — GABAPENTIN 300 MG PO CAPS: 300.0000 mg | ORAL_CAPSULE | Freq: Two times a day (BID) | ORAL | 1 refills | Status: DC

## 2016-12-14 MED ORDER — METHOCARBAMOL 750 MG PO TABS: 750.0000 mg | ORAL_TABLET | Freq: Two times a day (BID) | ORAL | 1 refills | Status: DC | PRN

## 2016-12-14 MED ORDER — TRAMADOL HCL 50 MG PO TABS: 50.0000 mg | ORAL_TABLET | Freq: Two times a day (BID) | ORAL | 1 refills | Status: DC | PRN

## 2016-12-14 MED FILL — METHOCARBAMOL 750 MG TABLET: 750 | 30 days supply | Qty: 60 | Fill #0

## 2016-12-14 MED FILL — traMADol HCL 50 MG TABS: 50 | 30 days supply | Qty: 60 | Fill #0

## 2016-12-14 MED FILL — EARWAX TREATMENT 6.5% DROPS: 6.5 | 15 days supply | Qty: 15 | Fill #0

## 2016-12-14 MED FILL — GABAPENTIN 300 MG CAPSULE: 300 | 30 days supply | Qty: 60 | Fill #0

## 2016-12-14 NOTE — Progress Notes (Signed)
   Subjective:    Patient ID: Megan Allen, female    DOB: 1956/07/13, 61 y.o.   MRN: [id number]  HPI Patient with a history of low back pain rated as 6-7/10 previously on meloxicam but unable to tolerate it due to GI side effects and has been on methocarbamol as well as tramadol. She comes in today for follow-up on back pain and reports symptoms are not controlled. She states symptoms did back to 2 years ago when she took a fall and hit her right side. Pain is exacerbated recently when she had to bend to pull up her sock. Denies radiation of pain but does occasionally have numbness in her right and leg; denies loss of sphincteric function.   Past Medical History:  Diagnosis Date  . Murmur     Past Surgical History:  Procedure Laterality Date  . ABDOMINAL HYSTERECTOMY     partial     No Known Allergies  Current Outpatient Prescriptions on File Prior to Visit  Medication Sig Dispense Refill  . aspirin EC 81 MG tablet Take 81 mg by mouth daily.    . ciprofloxacin-dexamethasone (CIPRODEX) otic suspension Place 4 drops into the right ear 2 (two) times daily. 7.5 mL 0  . meloxicam (MOBIC) 7.5 MG tablet Take 1 tablet (7.5 mg total) by mouth daily. (Patient not taking: Reported on 10/13/2016) 30 tablet 1  . methocarbamol (ROBAXIN-750) 750 MG tablet Take 1 tablet (750 mg total) by mouth 2 (two) times daily as needed for muscle spasms. 60 tablet 1  . orphenadrine (NORFLEX) 100 MG tablet Take 100 mg by mouth as needed for muscle spasms.    . traMADol (ULTRAM) 50 MG tablet Take 1 tablet (50 mg total) by mouth every 12 (twelve) hours as needed. 60 tablet 1   No current facility-administered medications on file prior to visit.       Review of Systems Constitutional: Negative for activity change and appetite change.  HENT: Negative for sinus pressure and sore throat.   Respiratory: Negative for chest tightness, shortness of breath and wheezing.   Cardiovascular: Negative for chest pain  and palpitations.  Gastrointestinal: Negative for abdominal distention, abdominal pain and constipation.  Genitourinary: Negative.   Musculoskeletal:       See hpi  Neurologic: Positive for intermittent numbness and right leg Psychiatric/Behavioral: Negative for behavioral problems and dysphoric mood.     Objective:   Physical Exam Constitutional: She is oriented to person, place, and time. She appears well-developed and well-nourished.  HEENT: Bilateral cerumen impaction Cardiovascular: Normal rate, normal heart sounds and intact distal pulses.   murmur heard. Pulmonary/Chest: Effort normal and breath sounds normal. She has no wheezes. She has no rales. She exhibits no tenderness.  Abdominal: Soft. Bowel sounds are normal. She exhibits no distension and no mass. There is no tenderness.  Musculoskeletal: Normal range of motion. She exhibits tenderness on palpation of the midline of lumbar spine; negative straight leg raise bilaterally  Neurological: She is alert and oriented to person, place, and time.        Assessment & Plan:  Chronic low back pain No red flags, no radiculopathy Gabapentin added to her regimen We'll send off for a lumbar spine x-ray to further evaluate Return to the clinic if symptoms worse Continue using heating pad

## 2016-12-15 ENCOUNTER — Encounter: Payer: Self-pay | Admitting: Family Medicine

## 2016-12-16 ENCOUNTER — Telehealth: Payer: Self-pay | Admitting: *Deleted

## 2016-12-16 NOTE — Telephone Encounter (Signed)
MA informed patient of x-ray showing some degenerative disc disease being secondary to the arthritis in L5-S1. Medical Assistant left message on patient's home and cell voicemail. Voicemail states to give a call back to [place] with [facility] at [phone].

## 2016-12-16 NOTE — Telephone Encounter (Signed)
-----   Message from Arnoldo Morale, MD sent at 12/14/2016 12:45 PM EST ----- X-ray revealed degenerative disc disease at L5-S1 which could be secondary to arthritis. This could explain her pain.

## 2016-12-16 NOTE — Telephone Encounter (Signed)
error 

## 2017-01-03 ENCOUNTER — Telehealth: Payer: Self-pay | Admitting: Family Medicine

## 2017-01-03 NOTE — Telephone Encounter (Signed)
Pt calling to get results of X-rays that she was told to get. Please f/u with pt.

## 2017-01-03 NOTE — Telephone Encounter (Signed)
On 12/16/16, the medical assistant gave her call with x-ray report which revealed degenerative changes (arthritis) in L5, S1 which could explain her low back pain.

## 2017-01-04 ENCOUNTER — Telehealth: Payer: Self-pay | Admitting: Family Medicine

## 2017-01-04 NOTE — Telephone Encounter (Signed)
Writer called patient back with results.  Patient denies getting a VM regarding results.  Patient is requesting a copy of xray report.  Writer copied and mailed out today.

## 2017-01-04 NOTE — Telephone Encounter (Signed)
Pt requesting an x-ray on her R shoulder and R leg due to still experiencing pain

## 2017-01-05 NOTE — Telephone Encounter (Signed)
What joint in the right leg would she needed x-rayed? The knee or the ankle?

## 2017-01-11 ENCOUNTER — Ambulatory Visit: Payer: BLUE CROSS/BLUE SHIELD | Admitting: Family Medicine

## 2017-01-19 ENCOUNTER — Encounter: Payer: Self-pay | Admitting: Family Medicine

## 2017-01-19 ENCOUNTER — Ambulatory Visit (HOSPITAL_COMMUNITY)
Admission: RE | Admit: 2017-01-19 | Discharge: 2017-01-19 | Disposition: A | Discharge status: Home / Self Care | Payer: BLUE CROSS/BLUE SHIELD | Source: Ambulatory Visit | Attending: Family Medicine | Admitting: Family Medicine

## 2017-01-19 ENCOUNTER — Ambulatory Visit: Payer: BLUE CROSS/BLUE SHIELD | Attending: Family Medicine | Admitting: Family Medicine

## 2017-01-19 VITALS — BP 102/70 | HR 87 | Temp 98.3°F | Ht 66.5 in | Wt 212.4 lb

## 2017-01-19 DIAGNOSIS — M79604 Pain in right leg: Secondary | ICD-10-CM

## 2017-01-19 DIAGNOSIS — M79661 Pain in right lower leg: Secondary | ICD-10-CM | POA: Diagnosis not present

## 2017-01-19 DIAGNOSIS — Z79899 Other long term (current) drug therapy: Secondary | ICD-10-CM | POA: Insufficient documentation

## 2017-01-19 DIAGNOSIS — Z7982 Long term (current) use of aspirin: Secondary | ICD-10-CM | POA: Diagnosis not present

## 2017-01-19 DIAGNOSIS — M25511 Pain in right shoulder: Secondary | ICD-10-CM | POA: Insufficient documentation

## 2017-01-19 DIAGNOSIS — B9789 Other viral agents as the cause of diseases classified elsewhere: Secondary | ICD-10-CM | POA: Diagnosis not present

## 2017-01-19 DIAGNOSIS — M19011 Primary osteoarthritis, right shoulder: Secondary | ICD-10-CM | POA: Insufficient documentation

## 2017-01-19 DIAGNOSIS — J069 Acute upper respiratory infection, unspecified: Secondary | ICD-10-CM | POA: Diagnosis not present

## 2017-01-19 MED ORDER — CETIRIZINE HCL 10 MG PO TABS: 10.0000 mg | ORAL_TABLET | Freq: Every day | ORAL | 1 refills | Status: DC

## 2017-01-19 NOTE — Telephone Encounter (Signed)
Patient was seen in clinic today.  Dr. Jarold Song ordered imaging and patient was going to [facility] to complete the imaging.

## 2017-01-19 NOTE — Progress Notes (Signed)
Subjective:  Patient ID: Megan Allen, female    DOB: 1956/09/07  Age: 61 y.o. MRN: [id number]  CC: right sided pain (shoulder, hip and entire leg)   HPI Megan Allen presents with complaints of right shoulder pain and right lower leg pain ever since she took a fall one year ago and fell on her right side. Pain is worse with range of motion and she has been taking meloxicam, tramadol and muscle relaxants with no relief in symptoms. She had previously complained of pain in her right ankle worse when she flexes and extends her ankle while driving.  At a previous visit she complained of low back pain and x-ray revealed degenerative changes and L5-S1.  She also complains of four-day history of cough which is sometimes productive of white sputum, weakness and lightheadedness; she has also had some myalgias and has been using over-the-counter cough remedies.  Denies sinus tenderness, fever, postnasal drip.  Past Medical History:  Diagnosis Date  . Murmur     Past Surgical History:  Procedure Laterality Date  . ABDOMINAL HYSTERECTOMY     partial     Outpatient Medications Prior to Visit  Medication Sig Dispense Refill  . aspirin EC 81 MG tablet Take 81 mg by mouth daily.    . carbamide peroxide (DEBROX) 6.5 % otic solution Place 5 drops into both ears 2 (two) times daily. 15 mL 0  . gabapentin (NEURONTIN) 300 MG capsule Take 1 capsule (300 mg total) by mouth 2 (two) times daily. 60 capsule 1  . meloxicam (MOBIC) 7.5 MG tablet Take 1 tablet (7.5 mg total) by mouth daily. 30 tablet 1  . traMADol (ULTRAM) 50 MG tablet Take 1 tablet (50 mg total) by mouth every 12 (twelve) hours as needed. 60 tablet 1  . methocarbamol (ROBAXIN-750) 750 MG tablet Take 1 tablet (750 mg total) by mouth 2 (two) times daily as needed for muscle spasms. 60 tablet 1  . orphenadrine (NORFLEX) 100 MG tablet Take 100 mg by mouth as needed for muscle spasms.    . ciprofloxacin-dexamethasone (CIPRODEX) otic  suspension Place 4 drops into the right ear 2 (two) times daily. 7.5 mL 0   No facility-administered medications prior to visit.     ROS Review of Systems Constitutional: Negative for activity change and appetite change.  HENT: see hpi Respiratory: Negative for chest tightness, shortness of breath and wheezing.   Cardiovascular: Negative for chest pain and palpitations.  Gastrointestinal: Negative for abdominal distention, abdominal pain and constipation.  Genitourinary: Negative.   Musculoskeletal:       See hpi  Neurologic: Positive for intermittent numbness in right leg Psychiatric/Behavioral: Negative for behavioral problems and dysphoric mood.     Objective:  BP 102/70 (BP Location: Right Arm, Patient Position: Sitting, Cuff Size: Large)   Pulse 87   Temp 98.3 F (36.8 C) (Oral)   Ht 5' 6.5" (1.689 m)   Wt 212 lb 6.4 oz (96.3 kg)   SpO2 96%   BMI 33.77 kg/m   BP/Weight 01/19/2017 12/14/2016 38/75/6433  Systolic BP 295 188 416  Diastolic BP 70 74 70  Wt. (Lbs) 212.4 211.2 215.6  BMI 33.77 33.58 34.28      Physical Exam Constitutional: She is oriented to person, place, and time. She appears well-developed and well-nourished.  HEENT: Normal tympanic membrane bilaterally, normal oropharynx, no sinus tenderness. Cardiovascular: Normal rate, normal heart sounds and intact distal pulses.   murmur heard. Pulmonary/Chest: Effort normal and breath sounds normal. She has  no wheezes. She has no rales. She exhibits no tenderness.  Abdominal: Soft. Bowel sounds are normal. She exhibits no distension and no mass. There is no tenderness.  Musculoskeletal: Normal range of motion. She exhibits tenderness on palpation of the midline of lumbar spine; negative straight leg raise bilaterally. No tenderness to palpation of right shoulder Full range of motion of the right shoulder, mild tenderness on palpation of the lateral aspect of right leg Neurological: She is alert and oriented to  person, place, and time.   Assessment & Plan:   1. Pain in joint of right shoulder Continue tramadol and Robaxin - DG Shoulder Right; Future  2. Right leg pain - DG Tibia/Fibula Right; Future  3. Viral upper respiratory tract infection Advised to use OTC antitussives as well - cetirizine (ZYRTEC) 10 MG tablet; Take 1 tablet (10 mg total) by mouth daily.  Dispense: 30 tablet; Refill: 1   Meds ordered this encounter  Medications  . cetirizine (ZYRTEC) 10 MG tablet    Sig: Take 1 tablet (10 mg total) by mouth daily.    Dispense:  30 tablet    Refill:  1    Follow-up: Return in about 3 months (around 04/21/2017) for Follow-up on chronic medical conditions.   Arnoldo Morale MD

## 2017-01-24 ENCOUNTER — Telehealth: Payer: Self-pay

## 2017-01-24 NOTE — Telephone Encounter (Signed)
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

## 2017-01-24 NOTE — Telephone Encounter (Signed)
Writer called and LVM on patient's personal phone.  Writer encouraged patient to call back with questions.

## 2017-03-09 ENCOUNTER — Telehealth: Payer: Self-pay | Admitting: Family Medicine

## 2017-03-09 NOTE — Telephone Encounter (Signed)
Pt calling to request a referral for an MRI. States that she is still experiencing pain in her legs and feet and now into her shoulders. States that during her x-ray she was told that the pain was from a nerve in her back. Please f/u with pt. Thank you.

## 2017-03-13 NOTE — Telephone Encounter (Signed)
Patient states xray confirmed concerns with a nerve and patient is requesting a MRI of the area because pain is still present and is now in the shoulders.

## 2017-03-13 NOTE — Telephone Encounter (Signed)
She is requesting MRI of what body part as she is complaining of pain in multiple locations?

## 2017-03-14 NOTE — Telephone Encounter (Signed)
Please schedule the patient an appointment of pain evaluation and need of MRI

## 2017-03-14 NOTE — Telephone Encounter (Signed)
Could you please schedule this patient for an office visit as we will need more clarity to order the MRI for the right body part. Thank you.

## 2017-03-29 ENCOUNTER — Telehealth: Payer: Self-pay | Admitting: Family Medicine

## 2017-03-29 NOTE — Telephone Encounter (Signed)
PT called to request a referral for MRI or an Orthopedic provider since she still have problem with her leg, please if the referral is auth follow with pt

## 2017-04-07 ENCOUNTER — Telehealth: Payer: Self-pay | Admitting: Family Medicine

## 2017-04-07 NOTE — Telephone Encounter (Signed)
PT called again to request a MRI for her back and orthopedic please call her, no one call her back yet, please follow up with PT

## 2017-04-07 NOTE — Telephone Encounter (Signed)
Is she requesting MRI for low or upper back pain? I will need to know the indication for orthopedic referral so that I can place it.

## 2017-04-12 DIAGNOSIS — M19011 Primary osteoarthritis, right shoulder: Secondary | ICD-10-CM | POA: Diagnosis not present

## 2017-04-12 DIAGNOSIS — M545 Low back pain: Secondary | ICD-10-CM | POA: Diagnosis not present

## 2017-04-16 DIAGNOSIS — M545 Low back pain: Secondary | ICD-10-CM | POA: Diagnosis not present

## 2017-04-19 DIAGNOSIS — M5136 Other intervertebral disc degeneration, lumbar region: Secondary | ICD-10-CM | POA: Diagnosis not present

## 2017-05-09 DIAGNOSIS — M545 Low back pain: Secondary | ICD-10-CM | POA: Diagnosis not present

## 2017-05-09 DIAGNOSIS — M5136 Other intervertebral disc degeneration, lumbar region: Secondary | ICD-10-CM | POA: Diagnosis not present

## 2017-05-09 DIAGNOSIS — M5416 Radiculopathy, lumbar region: Secondary | ICD-10-CM | POA: Diagnosis not present

## 2017-05-10 DIAGNOSIS — M545 Low back pain: Secondary | ICD-10-CM | POA: Diagnosis not present

## 2017-05-10 DIAGNOSIS — R531 Weakness: Secondary | ICD-10-CM | POA: Diagnosis not present

## 2017-05-10 DIAGNOSIS — M5126 Other intervertebral disc displacement, lumbar region: Secondary | ICD-10-CM | POA: Diagnosis not present

## 2017-05-15 DIAGNOSIS — M5126 Other intervertebral disc displacement, lumbar region: Secondary | ICD-10-CM | POA: Diagnosis not present

## 2017-05-15 DIAGNOSIS — R531 Weakness: Secondary | ICD-10-CM | POA: Diagnosis not present

## 2017-05-15 DIAGNOSIS — M545 Low back pain: Secondary | ICD-10-CM | POA: Diagnosis not present

## 2017-05-22 DIAGNOSIS — M545 Low back pain: Secondary | ICD-10-CM | POA: Diagnosis not present

## 2017-05-22 DIAGNOSIS — M5126 Other intervertebral disc displacement, lumbar region: Secondary | ICD-10-CM | POA: Diagnosis not present

## 2017-05-22 DIAGNOSIS — R531 Weakness: Secondary | ICD-10-CM | POA: Diagnosis not present

## 2017-05-31 DIAGNOSIS — M5126 Other intervertebral disc displacement, lumbar region: Secondary | ICD-10-CM | POA: Diagnosis not present

## 2017-05-31 DIAGNOSIS — M545 Low back pain: Secondary | ICD-10-CM | POA: Diagnosis not present

## 2017-05-31 DIAGNOSIS — R531 Weakness: Secondary | ICD-10-CM | POA: Diagnosis not present

## 2017-06-05 DIAGNOSIS — R531 Weakness: Secondary | ICD-10-CM | POA: Diagnosis not present

## 2017-06-05 DIAGNOSIS — M5126 Other intervertebral disc displacement, lumbar region: Secondary | ICD-10-CM | POA: Diagnosis not present

## 2017-06-05 DIAGNOSIS — M545 Low back pain: Secondary | ICD-10-CM | POA: Diagnosis not present

## 2017-06-09 NOTE — Telephone Encounter (Signed)
Patient is requesting an MRI or orthopedic referral due to persistent leg pain. Please advise.

## 2017-06-12 DIAGNOSIS — M545 Low back pain: Secondary | ICD-10-CM | POA: Diagnosis not present

## 2017-06-12 DIAGNOSIS — R531 Weakness: Secondary | ICD-10-CM | POA: Diagnosis not present

## 2017-06-12 DIAGNOSIS — M5126 Other intervertebral disc displacement, lumbar region: Secondary | ICD-10-CM | POA: Diagnosis not present

## 2017-06-12 NOTE — Telephone Encounter (Signed)
Called this patient to clarify her symptoms on what imaging she was requesting due to confusion from different staff messages but unable to leave a voicemail as her mailbox is full. Could you please schedule this patient for an appointment so she can explain her symptoms and what referral she is needing?Thank you.

## 2017-06-12 NOTE — Telephone Encounter (Signed)
Pt has been seen for leg pain.

## 2017-06-20 DIAGNOSIS — M5126 Other intervertebral disc displacement, lumbar region: Secondary | ICD-10-CM | POA: Diagnosis not present

## 2017-06-20 DIAGNOSIS — M545 Low back pain: Secondary | ICD-10-CM | POA: Diagnosis not present

## 2017-06-20 DIAGNOSIS — M5416 Radiculopathy, lumbar region: Secondary | ICD-10-CM | POA: Diagnosis not present

## 2017-11-04 DIAGNOSIS — H5319 Other subjective visual disturbances: Secondary | ICD-10-CM | POA: Diagnosis not present

## 2017-11-28 ENCOUNTER — Other Ambulatory Visit: Payer: Self-pay

## 2017-11-28 ENCOUNTER — Encounter: Payer: Self-pay | Admitting: Family Medicine

## 2017-11-28 ENCOUNTER — Ambulatory Visit: Payer: BLUE CROSS/BLUE SHIELD | Attending: Family Medicine | Admitting: Family Medicine

## 2017-11-28 VITALS — BP 109/71 | HR 75 | Temp 97.7°F | Ht 66.0 in | Wt 220.2 lb

## 2017-11-28 DIAGNOSIS — M545 Low back pain, unspecified: Secondary | ICD-10-CM

## 2017-11-28 DIAGNOSIS — G44029 Chronic cluster headache, not intractable: Secondary | ICD-10-CM | POA: Diagnosis not present

## 2017-11-28 DIAGNOSIS — G8929 Other chronic pain: Secondary | ICD-10-CM | POA: Diagnosis not present

## 2017-11-28 DIAGNOSIS — M940 Chondrocostal junction syndrome [Tietze]: Secondary | ICD-10-CM | POA: Diagnosis not present

## 2017-11-28 DIAGNOSIS — H539 Unspecified visual disturbance: Secondary | ICD-10-CM

## 2017-11-28 DIAGNOSIS — Z13228 Encounter for screening for other metabolic disorders: Secondary | ICD-10-CM

## 2017-11-28 DIAGNOSIS — Z79899 Other long term (current) drug therapy: Secondary | ICD-10-CM | POA: Insufficient documentation

## 2017-11-28 DIAGNOSIS — Z7982 Long term (current) use of aspirin: Secondary | ICD-10-CM | POA: Insufficient documentation

## 2017-11-28 MED ORDER — METHOCARBAMOL 750 MG PO TABS: 750.0000 mg | ORAL_TABLET | Freq: Two times a day (BID) | ORAL | 1 refills | Status: DC | PRN

## 2017-11-28 MED FILL — METHOCARBAMOL 750 MG TABS: 750 | 30 days supply | Qty: 60 | Fill #0

## 2017-11-28 NOTE — Progress Notes (Signed)
Patient has been seeing whit spots and flashing white lights.

## 2017-11-28 NOTE — Progress Notes (Signed)
Subjective:  Patient ID: Megan Allen, female    DOB: Dec 19, 1955  Age: 62 y.o. MRN: [id number]  CC: Eye Problem   HPI Megan Allen is a 62 year old female with chronic low back pain who presents today complaining of seeing flashes of light light across her left eye.  They were initially seen throughout the day but now  Occur majorly at night and she describes this as "a row off Christmas tree lights" with associated blurry vision. Denies eye pain. Seen by ophthalmology and as per patient no abnormalities detected but  neurology referral recommended. She endorses some headache superior to her left eye which is intermittent.  Denies nausea, vomiting or tearing of her eye.  She endorses chest pain which occurred at rest today while she was at [employer] study and this lasted for 20 minutes and resolve spontaneously.  She did not experience diaphoresis, shortness of breath.  Past Medical History:  Diagnosis Date  . Murmur     Past Surgical History:  Procedure Laterality Date  . ABDOMINAL HYSTERECTOMY     partial    No Known Allergies   Outpatient Medications Prior to Visit  Medication Sig Dispense Refill  . aspirin EC 81 MG tablet Take 81 mg by mouth daily.    . meloxicam (MOBIC) 7.5 MG tablet Take 1 tablet (7.5 mg total) by mouth daily. 30 tablet 1  . traMADol (ULTRAM) 50 MG tablet Take 1 tablet (50 mg total) by mouth every 12 (twelve) hours as needed. 60 tablet 1  . methocarbamol (ROBAXIN-750) 750 MG tablet Take 1 tablet (750 mg total) by mouth 2 (two) times daily as needed for muscle spasms. 60 tablet 1  . carbamide peroxide (DEBROX) 6.5 % otic solution Place 5 drops into both ears 2 (two) times daily. (Patient not taking: Reported on 11/28/2017) 15 mL 0  . cetirizine (ZYRTEC) 10 MG tablet Take 1 tablet (10 mg total) by mouth daily. (Patient not taking: Reported on 11/28/2017) 30 tablet 1  . gabapentin (NEURONTIN) 300 MG capsule Take 1 capsule (300 mg total) by mouth 2 (two) times  daily. (Patient not taking: Reported on 11/28/2017) 60 capsule 1  . orphenadrine (NORFLEX) 100 MG tablet Take 100 mg by mouth as needed for muscle spasms.     No facility-administered medications prior to visit.     ROS Review of Systems  Constitutional: Negative for activity change, appetite change and fatigue.  HENT: Negative for congestion, sinus pressure and sore throat.   Eyes: Positive for visual disturbance.  Respiratory: Negative for cough, chest tightness, shortness of breath and wheezing.   Cardiovascular: Negative for chest pain and palpitations.  Gastrointestinal: Negative for abdominal distention, abdominal pain and constipation.  Endocrine: Negative for polydipsia.  Genitourinary: Negative for dysuria and frequency.  Musculoskeletal: Negative for arthralgias and back pain.  Skin: Negative for rash.  Neurological: Positive for headaches. Negative for tremors, light-headedness and numbness.  Hematological: Does not bruise/bleed easily.  Psychiatric/Behavioral: Negative for agitation and behavioral problems.    Objective:  BP 109/71   Pulse 75   Temp 97.7 F (36.5 C) (Oral)   Ht 5' 6"  (1.676 m)   Wt 220 lb 3.2 oz (99.9 kg)   SpO2 98%   BMI 35.54 kg/m   BP/Weight 11/28/2017 01/19/2017 4/50/3888  Systolic BP 280 034 917  Diastolic BP 71 70 74  Wt. (Lbs) 220.2 212.4 211.2  BMI 35.54 33.77 33.58      Physical Exam  Constitutional: She is oriented to person, place,  and time. She appears well-developed and well-nourished.  Cardiovascular: Normal rate, normal heart sounds and intact distal pulses.  No murmur heard. Pulmonary/Chest: Effort normal and breath sounds normal. She has no wheezes. She has no rales. She exhibits tenderness (Reproducible TTP of left chestwall).  Abdominal: Soft. Bowel sounds are normal. She exhibits no distension and no mass. There is no tenderness.  Musculoskeletal: Normal range of motion.  Neurological: She is alert and oriented to person,  place, and time.  Skin: Skin is warm and dry.  Psychiatric: She has a normal mood and affect.     Assessment & Plan:   1. Chronic cluster headache, not intractable Declines medications at this time  Will refer to neurology as per patient request - Ambulatory referral to Neurology  2. Chronic midline low back pain without sciatica Stable - methocarbamol (ROBAXIN-750) 750 MG tablet; Take 1 tablet (750 mg total) by mouth 2 (two) times daily as needed for muscle spasms.  Dispense: 60 tablet; Refill: 1  3. Costochondritis EKG revealed normal sinus rhythm, no ST changes Advised to use NSAIDs  4. Screening for metabolic disorder - [id number]+QUIQ - Lipid panel  5. Abnormal vision Seen by ophthalmology, findings unrevealing Neurology referral recommended   Meds ordered this encounter  Medications  . methocarbamol (ROBAXIN-750) 750 MG tablet    Sig: Take 1 tablet (750 mg total) by mouth 2 (two) times daily as needed for muscle spasms.    Dispense:  60 tablet    Refill:  1    Follow-up: Return if symptoms worsen or fail to improve.   [name] MD

## 2017-11-28 NOTE — Patient Instructions (Signed)
Costochondritis Costochondritis is swelling and irritation (inflammation) of the tissue (cartilage) that connects your ribs to your breastbone (sternum). This causes pain in the front of your chest. Usually, the pain:  Starts gradually.  Is in more than one rib.  This condition usually goes away on its own over time. Follow these instructions at home:  Do not do anything that makes your pain worse.  If directed, put ice on the painful area: ? Put ice in a plastic bag. ? Place a towel between your skin and the bag. ? Leave the ice on for 20 minutes, 2-3 times a day.  If directed, put heat on the affected area as often as told by your doctor. Use the heat source that your doctor tells you to use, such as a moist heat pack or a heating pad. ? Place a towel between your skin and the heat source. ? Leave the heat on for 20-30 minutes. ? Take off the heat if your skin turns bright red. This is very important if you cannot feel pain, heat, or cold. You may have a greater risk of getting burned.  Take over-the-counter and prescription medicines only as told by your doctor.  Return to your normal activities as told by your doctor. Ask your doctor what activities are safe for you.  Keep all follow-up visits as told by your doctor. This is important. Contact a doctor if:  You have chills or a fever.  Your pain does not go away or it gets worse.  You have a cough that does not go away. Get help right away if:  You are short of breath. This information is not intended to replace advice given to you by your health care provider. Make sure you discuss any questions you have with your health care provider. Document Released: 03/28/2008 Document Revised: 04/29/2016 Document Reviewed: 02/03/2016 Elsevier Interactive Patient Education  2018 Elsevier Inc.  

## 2017-11-29 LAB — CMP14+EGFR
ALT: 18 IU/L (ref 0–32)
AST: 16 IU/L (ref 0–40)
Albumin/Globulin Ratio: 1.9 (ref 1.2–2.2)
Albumin: 4.5 g/dL (ref 3.6–4.8)
Alkaline Phosphatase: 72 IU/L (ref 39–117)
BUN/Creatinine Ratio: 17 (ref 12–28)
BUN: 13 mg/dL (ref 8–27)
Bilirubin Total: 0.4 mg/dL (ref 0.0–1.2)
CO2: 26 mmol/L (ref 20–29)
Calcium: 9.9 mg/dL (ref 8.7–10.3)
Chloride: 100 mmol/L (ref 96–106)
Creatinine, Ser: 0.75 mg/dL (ref 0.57–1.00)
GFR calc Af Amer: 99 mL/min/{1.73_m2} (ref >59)
GFR calc non Af Amer: 86 mL/min/{1.73_m2} (ref >59)
Globulin, Total: 2.4 g/dL (ref 1.5–4.5)
Glucose: 77 mg/dL (ref 65–99)
Potassium: 4 mmol/L (ref 3.5–5.2)
Sodium: 142 mmol/L (ref 134–144)
Total Protein: 6.9 g/dL (ref 6.0–8.5)

## 2017-11-29 LAB — LIPID PANEL
Chol/HDL Ratio: 3 ratio (ref 0.0–4.4)
Cholesterol, Total: 191 mg/dL (ref 100–199)
HDL: 64 mg/dL (ref >39)
LDL Calculated: 115 mg/dL — ABNORMAL HIGH (ref 0–99)
Triglycerides: 62 mg/dL (ref 0–149)
VLDL Cholesterol Cal: 12 mg/dL (ref 5–40)

## 2018-01-23 ENCOUNTER — Telehealth: Payer: Self-pay | Admitting: Family Medicine

## 2018-01-23 NOTE — Telephone Encounter (Signed)
Patient called and requested to speak with pcp, registrar asked if she could ask what its regarding patient stated she just needed to talk to her, please fu at your earliest convenience.

## 2018-01-24 NOTE — Telephone Encounter (Signed)
Could you please contact this patient to see what this is about?  Thank you

## 2018-01-24 NOTE — Telephone Encounter (Signed)
Patient was called and wanted information on how to apply for disability. Patient was informed about legal aid service at the clinic. Patient's information will be passed on to legal aid.

## 2018-01-30 ENCOUNTER — Ambulatory Visit (INDEPENDENT_AMBULATORY_CARE_PROVIDER_SITE_OTHER): Payer: BLUE CROSS/BLUE SHIELD | Admitting: Neurology

## 2018-01-30 ENCOUNTER — Other Ambulatory Visit: Payer: Self-pay | Admitting: *Deleted

## 2018-01-30 ENCOUNTER — Telehealth: Payer: Self-pay | Admitting: Neurology

## 2018-01-30 ENCOUNTER — Encounter: Payer: Self-pay | Admitting: Neurology

## 2018-01-30 ENCOUNTER — Telehealth: Payer: Self-pay | Admitting: Radiology

## 2018-01-30 VITALS — BP 109/81 | HR 76 | Ht 66.0 in | Wt 221.0 lb

## 2018-01-30 DIAGNOSIS — G8929 Other chronic pain: Secondary | ICD-10-CM | POA: Insufficient documentation

## 2018-01-30 DIAGNOSIS — R51 Headache: Secondary | ICD-10-CM

## 2018-01-30 DIAGNOSIS — R519 Headache, unspecified: Secondary | ICD-10-CM | POA: Insufficient documentation

## 2018-01-30 DIAGNOSIS — H538 Other visual disturbances: Secondary | ICD-10-CM

## 2018-01-30 MED ORDER — NORTRIPTYLINE HCL 10 MG PO CAPS: 20.0000 mg | ORAL_CAPSULE | Freq: Every day | ORAL | 11 refills | Status: DC

## 2018-01-30 MED ORDER — SUMATRIPTAN SUCCINATE 50 MG PO TABS: 50.0000 mg | ORAL_TABLET | ORAL | 6 refills | Status: DC | PRN

## 2018-01-30 MED ORDER — SUMATRIPTAN SUCCINATE 50 MG PO TABS: ORAL_TABLET | ORAL | 6 refills | Status: DC

## 2018-01-30 MED ORDER — NORTRIPTYLINE HCL 10 MG PO CAPS
20.0000 mg | ORAL_CAPSULE | Freq: Every day | ORAL | 11 refills | Status: DC
Start: 2018-01-30 — End: 2018-05-29

## 2018-01-30 MED FILL — NORTRIPTYLINE HCL 10 MG CAP: 10 | 30 days supply | Qty: 60 | Fill #0

## 2018-01-30 MED FILL — SUMATRIPTAN SUCC 50 MG TAB: 50 | 30 days supply | Qty: 12 | Fill #0

## 2018-01-30 NOTE — Progress Notes (Signed)
PATIENT: Megan Allen DOB: Apr 13, 1956  Chief Complaint  Patient presents with  . Headache    She is here with her husband, Kyung Rudd and her neice, Tye [place]. Reports daily left-sided headaches that are unrelieved by OTC NSAIDS. Headaches started at the end of December 2018.  She denies nausea or dizziness.  She has some light and noise sensitivity.  She often sees flashes of white lights in her left eye.  Marland Kitchen PCP    [name], MD     Megan Allen is a 62 year old female, seen in refer by her primary care doctor [name] for evaluation of headaches, she is accompanied by her husband and niece at today's clinical visit, initial evaluation was on January 30, 2018.  She denies a previous history of headache, she was driving long distances on an unfamiliar route, her GBS went out, and it is getting dark, she felt very stressful, then begin to see white flashing light at left peripheral visual field, lasting for a few seconds, followed by left-sided headaches, it has been persistent since that day, on almost daily basis, she had intermittent flashing light in her left visual field, especially at nighttime, she kept a low-grade left temporal frontal area pressure headaches, couple times each week, it would be exacerbated to a more severe headache, with light noise sensitivity, she tends to use retrieved into a dark quiet room, Tylenol as needed provide partial relief,  She also complains some blurry vision,  REVIEW OF SYSTEMS: Full 14 system review of systems performed and notable only for  As above  ALLERGIES: Allergies  Allergen Reactions  . Caffeine     Chest pain, heart racing    HOME MEDICATIONS: Current Outpatient Medications  Medication Sig Dispense Refill  . Acetaminophen (TYLENOL PO) Take 650 mg by mouth as needed.    . Ascorbic Acid (VITAMIN C) 1000 MG tablet Take 1,000 mg by mouth daily.    Marland Kitchen aspirin EC 81 MG tablet Take 81 mg by mouth daily.    . bisacodyl (DULCOLAX) 5  MG EC tablet Take 5 mg by mouth daily as needed for moderate constipation.    . Calcium Carb-Cholecalciferol (CALCIUM 600+D) 600-800 MG-UNIT TABS Take 1 tablet by mouth daily.    . Cholecalciferol (VITAMIN D3 PO) Take 2,000 Units by mouth daily.    . Cyanocobalamin (VITAMIN B-12 PO) Take 3,000 mcg by mouth daily.    . Diclofenac Sodium (PENNSAID) 2 % SOLN Place onto the skin daily.    . methocarbamol (ROBAXIN) 750 MG tablet Take 750 mg by mouth as needed for muscle spasms. Take two tablets daily as needed.    . Multiple Vitamin (MULTIVITAMIN) capsule Take 1 capsule by mouth daily.    Marland Kitchen UNABLE TO FIND Take 1 tablet by mouth as needed. OTC Calms for simple nervous tension and occasional sleeplessness.     No current facility-administered medications for this visit.     PAST MEDICAL HISTORY: Past Medical History:  Diagnosis Date  . Chronic back pain   . Headache   . Murmur   . Muscle spasm     PAST SURGICAL HISTORY: Past Surgical History:  Procedure Laterality Date  . ABDOMINAL HYSTERECTOMY     partial    FAMILY HISTORY: Family History  Problem Relation Age of Onset  . Hyperlipidemia Mother   . Diabetes Father   . Diabetes Brother   . Heart disease Daughter   . Stroke Maternal Grandfather     SOCIAL HISTORY:  Social  History   Socioeconomic History  . Marital status: Married    Spouse name: Not on file  . Number of children: 3  . Years of education: some college  . Highest education level: Not on file  Occupational History  . Occupation: Hair Dresser  Social Needs  . Financial resource strain: Not on file  . Food insecurity:    Worry: Not on file    Inability: Not on file  . Transportation needs:    Medical: Not on file    Non-medical: Not on file  Tobacco Use  . Smoking status: Never Smoker  . Smokeless tobacco: Never Used  Substance and Sexual Activity  . Alcohol use: No  . Drug use: No  . Sexual activity: Yes    Birth control/protection: Surgical    Lifestyle  . Physical activity:    Days per week: Not on file    Minutes per session: Not on file  . Stress: Not on file  Relationships  . Social connections:    Talks on phone: Not on file    Gets together: Not on file    Attends religious service: Not on file    Active member of club or organization: Not on file    Attends meetings of clubs or organizations: Not on file    Relationship status: Not on file  . Intimate partner violence:    Fear of current or ex partner: Not on file    Emotionally abused: Not on file    Physically abused: Not on file    Forced sexual activity: Not on file  Other Topics Concern  . Not on file  Social History Narrative   Lives at home with her husband.   Right-handed.   No caffeine per day.     PHYSICAL EXAM   Vitals:   01/30/18 0825  BP: 109/81  Pulse: 76  Weight: 221 lb (100.2 kg)  Height: 5' 6"  (1.676 m)    Not recorded      Body mass index is 35.67 kg/m.  PHYSICAL EXAMNIATION:  Gen: NAD, conversant, well nourised, obese, well groomed                     Cardiovascular: Regular rate rhythm, no peripheral edema, warm, nontender. Eyes: Conjunctivae clear without exudates or hemorrhage Neck: Supple, no carotid bruits. Pulmonary: Clear to auscultation bilaterally   NEUROLOGICAL EXAM:  MENTAL STATUS: Speech:    Speech is normal; fluent and spontaneous with normal comprehension.  Cognition:     Orientation to time, place and person     Normal recent and remote memory     Normal Attention span and concentration     Normal Language, naming, repeating,spontaneous speech     Fund of knowledge   CRANIAL NERVES: CN II: Visual fields are full to confrontation. Fundoscopic exam is normal with sharp discs and no vascular changes. Pupils are round equal and briskly reactive to light. CN III, IV, VI: extraocular movement are normal. No ptosis. CN V: Facial sensation is intact to pinprick in all 3 divisions bilaterally. Corneal  responses are intact.  CN VII: Face is symmetric with normal eye closure and smile. CN VIII: Hearing is normal to rubbing fingers CN IX, X: Palate elevates symmetrically. Phonation is normal. CN XI: Head turning and shoulder shrug are intact CN XII: Tongue is midline with normal movements and no atrophy.  MOTOR: There is no pronator drift of out-stretched arms. Muscle bulk and tone are normal. Muscle  strength is normal.  REFLEXES: Reflexes are 2+ and symmetric at the biceps, triceps, knees, and ankles. Plantar responses are flexor.  SENSORY: Intact to light touch, pinprick, positional sensation and vibratory sensation are intact in fingers and toes.  COORDINATION: Rapid alternating movements and fine finger movements are intact. There is no dysmetria on finger-to-nose and heel-knee-shin.    GAIT/STANCE: Posture is normal. Gait is steady with normal steps, base, arm swing, and turning. Heel and toe walking are normal. Tandem gait is normal.  Romberg is absent.   DIAGNOSTIC DATA (LABS, IMAGING, TESTING) - I reviewed patient records, labs, notes, testing and imaging myself where available.   ASSESSMENT AND PLAN  Megan Allen is a 62 y.o. female   New onset left-sided headaches, with left peripheral visual field flashing light  Most consistent with migraine headaches,  But need to rule out right occipital lesion, temporal arteritis,  ESR C-reactive protein,  MRI brain.   Imitrex 71m as needed, Nortriptyline 146mtitrating to 205mhs as preventive medications   YijMarcial Pacas.D. Ph.D.  [facility] [address] [id number]: [phone]: [phone]: NewCharlott RakesD

## 2018-01-30 NOTE — Telephone Encounter (Signed)
Both prescriptions sent to requested pharmacy below.  [facility] has also been called and previously sent prescriptions have been voided.

## 2018-01-30 NOTE — Telephone Encounter (Signed)
I spoke to the patient to schedule her MRI. I informed her it was going to cost her about $ 1,021.29 because she has a very high deductiable that is $6,162.01. I offer the monthly payment plan and I also offer her to just bring at least $75 when she comes to have the MRI.Marland Kitchen She stated she doesn't know what she is going to do right now so she is going to think about it and then get back to me. [employer] Auth: [id number] (exp. 01/30/18 to 02/28/18).

## 2018-01-30 NOTE — Telephone Encounter (Signed)
Pt returned Emily's call °

## 2018-01-30 NOTE — Telephone Encounter (Signed)
Patient said she gave wrong pharmacy earlier today at her visit with Dr. Krista Blue. She needs nortriptyline (PAMELOR) 10 MG capsule and SUMAtriptan (IMITREX) 50 MG tablet called to [facility].

## 2018-01-31 ENCOUNTER — Telehealth: Payer: Self-pay | Admitting: *Deleted

## 2018-01-31 LAB — SEDIMENTATION RATE: Sed Rate: 2 mm/hr (ref 0–40)

## 2018-01-31 LAB — C-REACTIVE PROTEIN: CRP: 3.2 mg/L (ref 0.0–4.9)

## 2018-01-31 LAB — TSH: TSH: 2.15 u[IU]/mL (ref 0.450–4.500)

## 2018-01-31 NOTE — Telephone Encounter (Signed)
Patient is scheduled for 02/07/18 on the [facility] mobile unit.

## 2018-01-31 NOTE — Telephone Encounter (Signed)
Spoke to patient she is aware of results

## 2018-01-31 NOTE — Telephone Encounter (Signed)
-----   Message from Marcial Pacas, MD sent at 01/31/2018  8:31 AM EDT ----- Please call patient for normal laboratory result

## 2018-02-07 ENCOUNTER — Telehealth: Payer: Self-pay | Admitting: Neurology

## 2018-02-07 ENCOUNTER — Ambulatory Visit: Payer: BLUE CROSS/BLUE SHIELD

## 2018-02-07 DIAGNOSIS — R519 Headache, unspecified: Secondary | ICD-10-CM

## 2018-02-07 DIAGNOSIS — R51 Headache: Secondary | ICD-10-CM

## 2018-02-07 NOTE — Telephone Encounter (Signed)
Please call patient, MRI of the brain showed nonspecific small vessel disease, no acute abnormality,  I will review MRIs with her at next follow-up visit.  IMPRESSION: This MRI of the brain without contrast shows the following: 1.    Few scattered T2/FLAIR hyperintense foci in the periventricular deep white matter.  This is a nonspecific finding and could be due to chronic age-related microvascular ischemic change or, less likely, to chronic demyelination 2.    The pituitary height is reduced and a normal size sella turcica consistent with a "partially empty sella".  This is usually an incidental finding but could also be seen with elevated intracranial pressures. 3.    There are no acute findings

## 2018-02-08 NOTE — Telephone Encounter (Signed)
Spoke with Malaysha and reviewed below MRI report.  She verbalized understanding of same/fim

## 2018-05-08 ENCOUNTER — Ambulatory Visit: Payer: BLUE CROSS/BLUE SHIELD | Admitting: Neurology

## 2018-05-29 ENCOUNTER — Ambulatory Visit (INDEPENDENT_AMBULATORY_CARE_PROVIDER_SITE_OTHER): Payer: BLUE CROSS/BLUE SHIELD | Admitting: Neurology

## 2018-05-29 ENCOUNTER — Encounter: Payer: Self-pay | Admitting: Neurology

## 2018-05-29 VITALS — BP 126/80 | HR 76 | Ht 66.0 in | Wt 229.5 lb

## 2018-05-29 DIAGNOSIS — IMO0002 Reserved for concepts with insufficient information to code with codable children: Secondary | ICD-10-CM | POA: Insufficient documentation

## 2018-05-29 DIAGNOSIS — G43709 Chronic migraine without aura, not intractable, without status migrainosus: Secondary | ICD-10-CM | POA: Diagnosis not present

## 2018-05-29 NOTE — Progress Notes (Signed)
PATIENT: Megan Allen DOB: 1956-06-02  Chief Complaint  Patient presents with  . Migraine    She is here with her husband, Megan Allen.  They would like to review her MRI.  She stopped the nortiptyline 62m at bedtime because she felt her headaches became worse.  She estimates having 1-2 headaches per week that she is using OTC NSAIDS to treat.  She has never tried sumatriptan.     AKendy Hastonis Megan 62year old female, seen in refer by her primary care doctor NCharlott Rakesfor evaluation of headaches, she is accompanied by her husband and niece at today's clinical visit, initial evaluation was on January 30, 2018.  She denies Megan previous history of headache, she was driving long distances on an unfamiliar route, her GBS went out, and it is getting dark, she felt very stressful, then begin to see white flashing light at left peripheral visual field, lasting for Megan few seconds, followed by left-sided headaches, it has been persistent since that day, on almost daily basis, she had intermittent flashing light in her left visual field, especially at nighttime, she kept Megan low-grade left temporal frontal area pressure headaches, couple times each week, it would be exacerbated to Megan more severe headache, with light noise sensitivity, she tends to use retrieved into Megan dark quiet room, Tylenol as needed provide partial relief,  She also complains some blurry vision,  UPDATE August 6th 2019: Laboratory evaluation showed normal ESR, crp, TSH.  We personally reviewed MRI of the brain without contrast on February 07, 2018, mild  small vessel disease there is no acute abnormality  She has tried nortriptyline, did not notice significant benefit no longer taking it  She is now taking motrin as needed, she never tried imitrex  REVIEW OF SYSTEMS: Full 14 system review of systems performed and notable only for  As above  ALLERGIES: Allergies  Allergen Reactions  . Caffeine     Chest pain, heart racing     HOME MEDICATIONS: Current Outpatient Medications  Medication Sig Dispense Refill  . acetaminophen (TYLENOL 8 HOUR ARTHRITIS PAIN) 650 MG CR tablet Take 650 mg by mouth as needed for pain.    . Ascorbic Acid (VITAMIN C) 1000 MG tablet Take 1,000 mg by mouth daily.    .Marland Kitchenaspirin EC 81 MG tablet Take 81 mg by mouth daily.    . bisacodyl (DULCOLAX) 5 MG EC tablet Take 5 mg by mouth daily as needed for moderate constipation.    . Calcium Carb-Cholecalciferol (CALCIUM 600+D) 600-800 MG-UNIT TABS Take 1 tablet by mouth daily.    . Cholecalciferol (VITAMIN D3 PO) Take 2,000 Units by mouth daily.    . Cyanocobalamin (VITAMIN B-12 PO) Take 3,000 mcg by mouth daily.    . Diclofenac Sodium (PENNSAID) 2 % SOLN Place onto the skin daily.    . Diclofenac Sodium (PENNSAID) 2 % SOLN Place onto the skin.    .Marland KitchenECHINACEA PO Take 400 mg by mouth as needed.    . methocarbamol (ROBAXIN) 750 MG tablet Take 750 mg by mouth as needed for muscle spasms. Take two tablets daily as needed.    . Multiple Vitamin (MULTIVITAMIN) capsule Take 1 capsule by mouth daily.    . naproxen sodium (ALEVE) 220 MG tablet Take 220 mg by mouth as needed.    . SUMAtriptan (IMITREX) 50 MG tablet Take 1 tab at onset of migraine.  May repeat in 2 hrs, if needed.  Max dose: 2 tabs/day. This is Megan  30 day prescription. 12 tablet 6  . UNABLE TO FIND Take 1 tablet by mouth as needed. OTC Calms for simple nervous tension and occasional sleeplessness.     No current facility-administered medications for this visit.     PAST MEDICAL HISTORY: Past Medical History:  Diagnosis Date  . Chronic back pain   . Headache   . Murmur   . Muscle spasm     PAST SURGICAL HISTORY: Past Surgical History:  Procedure Laterality Date  . ABDOMINAL HYSTERECTOMY     partial    FAMILY HISTORY: Family History  Problem Relation Age of Onset  . Hyperlipidemia Mother   . Diabetes Father   . Diabetes Brother   . Heart disease Daughter   . Stroke  Maternal Grandfather     SOCIAL HISTORY:  Social History   Socioeconomic History  . Marital status: Married    Spouse name: Not on file  . Number of children: 3  . Years of education: some college  . Highest education level: Not on file  Occupational History  . Occupation: Hair Dresser  Social Needs  . Financial resource strain: Not on file  . Food insecurity:    Worry: Not on file    Inability: Not on file  . Transportation needs:    Medical: Not on file    Non-medical: Not on file  Tobacco Use  . Smoking status: Never Smoker  . Smokeless tobacco: Never Used  Substance and Sexual Activity  . Alcohol use: No  . Drug use: No  . Sexual activity: Yes    Birth control/protection: Surgical  Lifestyle  . Physical activity:    Days per week: Not on file    Minutes per session: Not on file  . Stress: Not on file  Relationships  . Social connections:    Talks on phone: Not on file    Gets together: Not on file    Attends religious service: Not on file    Active member of club or organization: Not on file    Attends meetings of clubs or organizations: Not on file    Relationship status: Not on file  . Intimate partner violence:    Fear of current or ex partner: Not on file    Emotionally abused: Not on file    Physically abused: Not on file    Forced sexual activity: Not on file  Other Topics Concern  . Not on file  Social History Narrative   Lives at home with her husband.   Right-handed.   No caffeine per day.     PHYSICAL EXAM   Vitals:   05/29/18 0917  BP: 126/80  Pulse: 76  Weight: 229 lb 8 oz (104.1 kg)  Height: 5' 6"  (1.676 m)    Not recorded      Body mass index is 37.04 kg/m.  PHYSICAL EXAMNIATION:  Gen: NAD, conversant, well nourised, obese, well groomed                     Cardiovascular: Regular rate rhythm, no peripheral edema, warm, nontender. Eyes: Conjunctivae clear without exudates or hemorrhage Neck: Supple, no carotid  bruits. Pulmonary: Clear to auscultation bilaterally   NEUROLOGICAL EXAM:  MENTAL STATUS: Speech:    Speech is normal; fluent and spontaneous with normal comprehension.  Cognition:     Orientation to time, place and person     Normal recent and remote memory     Normal Attention span and concentration  Normal Language, naming, repeating,spontaneous speech     Fund of knowledge   CRANIAL NERVES: CN II: Visual fields are full to confrontation. Fundoscopic exam is normal with sharp discs and no vascular changes. Pupils are round equal and briskly reactive to light. CN III, IV, VI: extraocular movement are normal. No ptosis. CN V: Facial sensation is intact to pinprick in all 3 divisions bilaterally. Corneal responses are intact.  CN VII: Face is symmetric with normal eye closure and smile. CN VIII: Hearing is normal to rubbing fingers CN IX, X: Palate elevates symmetrically. Phonation is normal. CN XI: Head turning and shoulder shrug are intact CN XII: Tongue is midline with normal movements and no atrophy.  MOTOR: There is no pronator drift of out-stretched arms. Muscle bulk and tone are normal. Muscle strength is normal.  REFLEXES: Reflexes are 2+ and symmetric at the biceps, triceps, knees, and ankles. Plantar responses are flexor.  SENSORY: Intact to light touch, pinprick, positional sensation and vibratory sensation are intact in fingers and toes.  COORDINATION: Rapid alternating movements and fine finger movements are intact. There is no dysmetria on finger-to-nose and heel-knee-shin.    GAIT/STANCE: Posture is normal. Gait is steady with normal steps, base, arm swing, and turning. Heel and toe walking are normal. Tandem gait is normal.  Romberg is absent.   DIAGNOSTIC DATA (LABS, IMAGING, TESTING) - I reviewed patient records, labs, notes, testing and imaging myself where available.   ASSESSMENT AND PLAN  Megan Allen is Megan 62 y.o. female   New onset left-sided  headaches, with left peripheral visual field flashing light  Most consistent with migraine headaches  ESR C-reactive protein was normal,  MRI brain was essentially normal   Aleve as needed  Marcial Pacas, M.D. Ph.D.  [facility] [address] [place],  [address] Ph: [phone] Fax: [phone]  CC: [name], MD

## 2018-05-29 NOTE — Patient Instructions (Signed)
Magnesium oxide 400 mg twice a day Riboflavin 100 mg twice a day=Vit b2

## 2018-07-10 ENCOUNTER — Ambulatory Visit: Payer: BLUE CROSS/BLUE SHIELD | Admitting: Family Medicine

## 2018-09-04 ENCOUNTER — Other Ambulatory Visit: Payer: Self-pay | Admitting: Pharmacist

## 2018-09-04 ENCOUNTER — Ambulatory Visit: Payer: BLUE CROSS/BLUE SHIELD | Attending: Family Medicine | Admitting: Family Medicine

## 2018-09-04 ENCOUNTER — Ambulatory Visit: Payer: BLUE CROSS/BLUE SHIELD | Admitting: Licensed Clinical Social Worker

## 2018-09-04 ENCOUNTER — Encounter: Payer: Self-pay | Admitting: Family Medicine

## 2018-09-04 VITALS — BP 132/80 | HR 79 | Temp 97.5°F | Ht 66.0 in | Wt 231.2 lb

## 2018-09-04 DIAGNOSIS — Z90710 Acquired absence of both cervix and uterus: Secondary | ICD-10-CM | POA: Insufficient documentation

## 2018-09-04 DIAGNOSIS — M5441 Lumbago with sciatica, right side: Secondary | ICD-10-CM | POA: Insufficient documentation

## 2018-09-04 DIAGNOSIS — Z79899 Other long term (current) drug therapy: Secondary | ICD-10-CM | POA: Insufficient documentation

## 2018-09-04 DIAGNOSIS — R140 Abdominal distension (gaseous): Secondary | ICD-10-CM | POA: Diagnosis not present

## 2018-09-04 DIAGNOSIS — Z1239 Encounter for other screening for malignant neoplasm of breast: Secondary | ICD-10-CM | POA: Diagnosis not present

## 2018-09-04 DIAGNOSIS — R030 Elevated blood-pressure reading, without diagnosis of hypertension: Secondary | ICD-10-CM | POA: Diagnosis not present

## 2018-09-04 DIAGNOSIS — G8929 Other chronic pain: Secondary | ICD-10-CM

## 2018-09-04 DIAGNOSIS — Z6836 Body mass index (BMI) 36.0-36.9, adult: Secondary | ICD-10-CM | POA: Diagnosis not present

## 2018-09-04 DIAGNOSIS — Z7982 Long term (current) use of aspirin: Secondary | ICD-10-CM | POA: Insufficient documentation

## 2018-09-04 DIAGNOSIS — Z598 Other problems related to housing and economic circumstances: Secondary | ICD-10-CM

## 2018-09-04 DIAGNOSIS — M5137 Other intervertebral disc degeneration, lumbosacral region: Secondary | ICD-10-CM | POA: Insufficient documentation

## 2018-09-04 DIAGNOSIS — M5416 Radiculopathy, lumbar region: Secondary | ICD-10-CM | POA: Diagnosis not present

## 2018-09-04 DIAGNOSIS — Z599 Problem related to housing and economic circumstances, unspecified: Secondary | ICD-10-CM

## 2018-09-04 MED ORDER — GABAPENTIN 300 MG PO CAPS: 300.0000 mg | ORAL_CAPSULE | Freq: Every day | ORAL | 3 refills | Status: DC

## 2018-09-04 MED ORDER — METHOCARBAMOL 750 MG PO TABS: ORAL_TABLET | ORAL | 3 refills | Status: DC

## 2018-09-04 MED ORDER — METHOCARBAMOL 750 MG PO TABS: 750.0000 mg | ORAL_TABLET | Freq: Three times a day (TID) | ORAL | 3 refills | Status: DC | PRN

## 2018-09-04 MED FILL — GABAPENTIN 300 MG CAPSULE: 300 | 30 days supply | Qty: 30 | Fill #0

## 2018-09-04 NOTE — Patient Instructions (Signed)

## 2018-09-04 NOTE — Progress Notes (Signed)
Subjective:  Patient ID: Megan Allen, female    DOB: Mar 29, 1956  Age: 62 y.o. MRN: [id number]  CC: Abdominal Pain and Back Pain   HPI Megan Allen is a 62 year old female with history of chronic low back pain who presents today complaining of worsening of her pain and this radiates down her right lower extremity.  Pain has been present for the last 4 years ever since she fell off a table at the hospital while trying to undergo an x-ray.  She also has some numbness in her right lower extremity and currently sees [facility] orthopedics and had declined an epidural spinal injection because she wants to have this fixed. She has an upcoming appointment with them and is needing a refill on her muscle relaxant but does not want to take any medication that would be addictive.  Lumbar spine x-ray 11/2016: IMPRESSION: No evidence of acute fracture malalignment of the lumbar spine. Degenerative disc disease most advanced at L5-S1, with associated facet disease.  She has had to care for her sick mother and also her daughter with a stroke at this hospital a lot of stress on her and she is considering applying for disability as she needs help. She has noticed abdominal bloating for the last month with excessive gassiness.  She took some ginger ale and gas pills OTC with some relief.  Denies nausea, vomiting and has no pain.  Past Medical History:  Diagnosis Date  . Chronic back pain   . Headache   . Murmur   . Muscle spasm     Past Surgical History:  Procedure Laterality Date  . ABDOMINAL HYSTERECTOMY     partial    Allergies  Allergen Reactions  . Caffeine     Chest pain, heart racing     Outpatient Medications Prior to Visit  Medication Sig Dispense Refill  . acetaminophen (TYLENOL 8 HOUR ARTHRITIS PAIN) 650 MG CR tablet Take 650 mg by mouth as needed for pain.    . Ascorbic Acid (VITAMIN C) 1000 MG tablet Take 1,000 mg by mouth daily.    Marland Kitchen aspirin EC 81 MG tablet Take 81 mg by  mouth daily.    . bisacodyl (DULCOLAX) 5 MG EC tablet Take 5 mg by mouth daily as needed for moderate constipation.    . Calcium Carb-Cholecalciferol (CALCIUM 600+D) 600-800 MG-UNIT TABS Take 1 tablet by mouth daily.    . Cholecalciferol (VITAMIN D3 PO) Take 2,000 Units by mouth daily.    . Cyanocobalamin (VITAMIN B-12 PO) Take 3,000 mcg by mouth daily.    . Diclofenac Sodium (PENNSAID) 2 % SOLN Place onto the skin daily.    . Diclofenac Sodium (PENNSAID) 2 % SOLN Place onto the skin.    Marland Kitchen ECHINACEA PO Take 400 mg by mouth as needed.    . Multiple Vitamin (MULTIVITAMIN) capsule Take 1 capsule by mouth daily.    . naproxen sodium (ALEVE) 220 MG tablet Take 220 mg by mouth as needed.    . SUMAtriptan (IMITREX) 50 MG tablet Take 1 tab at onset of migraine.  May repeat in 2 hrs, if needed.  Max dose: 2 tabs/day. This is a 30 day prescription. 12 tablet 6  . UNABLE TO FIND Take 1 tablet by mouth as needed. OTC Calms for simple nervous tension and occasional sleeplessness.    . methocarbamol (ROBAXIN) 750 MG tablet Take 750 mg by mouth as needed for muscle spasms. Take two tablets daily as needed.     No facility-administered  medications prior to visit.     ROS Review of Systems  Constitutional: Negative for activity change, appetite change and fatigue.  HENT: Negative for congestion, sinus pressure and sore throat.   Eyes: Negative for visual disturbance.  Respiratory: Negative for cough, chest tightness, shortness of breath and wheezing.   Cardiovascular: Negative for chest pain and palpitations.  Gastrointestinal: Negative for abdominal distention, abdominal pain and constipation.  Endocrine: Negative for polydipsia.  Genitourinary: Negative for dysuria and frequency.  Musculoskeletal:       See hpi  Skin: Negative for rash.  Neurological: Negative for tremors, light-headedness and numbness.  Hematological: Does not bruise/bleed easily.  Psychiatric/Behavioral: Negative for agitation and  behavioral problems.    Objective:  BP 132/80   Pulse 79   Temp (!) 97.5 F (36.4 C) (Oral)   Ht 5\' 6"  (1.676 m)   Wt 231 lb 3.2 oz (104.9 kg)   SpO2 100%   BMI 37.32 kg/m   BP/Weight 09/04/2018 12/30/7562 12/24/2949  Systolic BP 884 166 063  Diastolic BP 80 80 81  Wt. (Lbs) 231.2 229.5 221  BMI 37.32 37.04 35.67      Physical Exam  Constitutional: She is oriented to person, place, and time. She appears well-developed and well-nourished.  Cardiovascular: Normal rate, normal heart sounds and intact distal pulses.  No murmur heard. Pulmonary/Chest: Effort normal and breath sounds normal. She has no wheezes. She has no rales. She exhibits no tenderness.  Abdominal: Soft. Bowel sounds are normal. She exhibits no distension and no mass. There is no tenderness.  Musculoskeletal: Normal range of motion.  Slight tenderness to palpation of right paraspinal lumbar muscles. Positive straight leg raise on the right  Neurological: She is alert and oriented to person, place, and time.     Assessment & Plan:   1. Chronic right-sided low back pain with right-sided sciatica Uncontrolled Declines epidural spinal injection Currently followed by orthopedics and would like to have a permanent fix She is requesting information with regards to application for disability and LCSW has been called in to educate her on this - methocarbamol (ROBAXIN) 750 MG tablet; Take 1 tablet (750 mg total) by mouth every 8 (eight) hours as needed for muscle spasms. Take two tablets daily as needed.  Dispense: 60 tablet; Refill: 3 - gabapentin (NEURONTIN) 300 MG capsule; Take 1 capsule (300 mg total) by mouth at bedtime.  Dispense: 60 capsule; Refill: 3  2. Abdominal bloating Advised to continue with simethicone but we will evaluate for H. pylori - H. pylori breath test  3. Screening for breast cancer - MM Digital Screening; Future   Meds ordered this encounter  Medications  . methocarbamol (ROBAXIN) 750  MG tablet    Sig: Take 1 tablet (750 mg total) by mouth every 8 (eight) hours as needed for muscle spasms. Take two tablets daily as needed.    Dispense:  60 tablet    Refill:  3  . gabapentin (NEURONTIN) 300 MG capsule    Sig: Take 1 capsule (300 mg total) by mouth at bedtime.    Dispense:  60 capsule    Refill:  3    Follow-up: Return in about 3 months (around 12/05/2018) for Follow-up of back pain.   [name] MD

## 2018-09-05 ENCOUNTER — Telehealth: Payer: Self-pay | Admitting: Family Medicine

## 2018-09-05 MED FILL — METHOCARBAMOL 750 MG TABS: 750 | 30 days supply | Qty: 60 | Fill #0

## 2018-09-05 NOTE — BH Specialist Note (Signed)
Integrated Behavioral Health Initial Visit  MRN: [id number] Name: Megan Allen  Number of [place] Clinician visits:: 1/6 Session Start time: 4:45 PM  Session End time: 5:00 PM Total time: 15 minutes  Type of Service: [place] Interpretor:No. Interpretor Name and Language: N/A   Warm Hand Off Completed.       SUBJECTIVE: Megan Allen is a 62 y.o. female accompanied by self Patient was referred by Dr. Margarita Rana for community resources. Patient reports the following symptoms/concerns: Pt reports that she suffers from chronic pain. She experiences financial strain due to her inability to work. Pt is interested in applying for disability Duration of problem: ; Severity of problem: na  OBJECTIVE: Mood: Appropriate and Affect: Appropriate Risk of harm to self or others: No plan to harm self or others  LIFE CONTEXT: Family and Social: Pt receives strong support from family School/Work: Pt is unemployed and is interested in applying for disability Self-Care: Pt prays and talks with family Life Changes: Pt reports that she suffers from chronic pain. She experiences financial strain due to her inability to work and cares for sick family members (daughter has hx of stroke and mother with dementia) Pt is interested in applying for disability   GOALS ADDRESSED: Patient will: 1. Reduce symptoms of: stress 2. Increase knowledge and/or ability of: coping skills  3. Demonstrate ability to: Increase adequate support systems for patient/family  INTERVENTIONS: Interventions utilized: Solution-Focused Strategies and Psychoeducation and/or Health Education  Standardized Assessments completed: GAD-7 and PHQ 2&9  ASSESSMENT: Patient currently experiencing stress triggered by chronic pain. She experiences financial strain due to her inability to work and cares for sick family members (daughter has hx of stroke and mother with dementia) Pt  is interested in applying for disability.   [facility] educated pt on the process of disability and provided supportive resources.  PLAN: 1. Follow up with behavioral health clinician on : Pt was encouraged to contact LCSWA if symptoms worsen or fail to improve to schedule behavioral appointments at [facility]. 2. Behavioral recommendations: LCSWA recommends that pt utilize provided resources. 3. Referral(s): Community Resources:  Disability Information 4. "From scale of 1-10, how likely are you to follow plan?":   Rebekah Chesterfield, LCSW 09/07/18 4:23 PM

## 2018-09-05 NOTE — Telephone Encounter (Signed)
Patient came in person to get her lab results. Please follow up with patient.

## 2018-09-06 LAB — H. PYLORI BREATH TEST: H pylori Breath Test: POSITIVE — AB

## 2018-09-07 ENCOUNTER — Telehealth: Payer: Self-pay

## 2018-09-07 ENCOUNTER — Other Ambulatory Visit: Payer: Self-pay | Admitting: Family Medicine

## 2018-09-07 MED ORDER — AMOXICILLIN 500 MG PO CAPS: 1000.0000 mg | ORAL_CAPSULE | Freq: Two times a day (BID) | ORAL | 0 refills | Status: DC

## 2018-09-07 MED ORDER — CLARITHROMYCIN 500 MG PO TABS: 500.0000 mg | ORAL_TABLET | Freq: Two times a day (BID) | ORAL | 0 refills | Status: DC

## 2018-09-07 MED ORDER — OMEPRAZOLE 20 MG PO CPDR: 20.0000 mg | DELAYED_RELEASE_CAPSULE | Freq: Two times a day (BID) | ORAL | 0 refills | Status: DC

## 2018-09-07 MED FILL — AMOXICILLIN 500 MG CAPSULE: 500 | 14 days supply | Qty: 56 | Fill #0

## 2018-09-07 MED FILL — CLARITHROMYCIN 500 MG TAB: 500 | 14 days supply | Qty: 28 | Fill #0

## 2018-09-07 MED FILL — OMEPRAZOLE 20 MG CAP: 20 | 14 days supply | Qty: 28 | Fill #0

## 2018-09-07 NOTE — Telephone Encounter (Signed)
The handicap form can be completed for her and I and sign off on it.

## 2018-09-07 NOTE — Telephone Encounter (Signed)
Form has been filled out and placed on PCP desk for signature.

## 2018-09-07 NOTE — Telephone Encounter (Signed)
-----   Message from [name], MD sent at 09/07/2018 11:13 AM EST ----- She tested positive for Helicobacter pylori bacteria which could explain her abdominal symptoms and bloating.  I have sent a combination of three medications to her pharmacy for treatment-amoxicillin, Biaxin and omeprazole which she is to take for the next 14 days.

## 2018-09-07 NOTE — Telephone Encounter (Signed)
Patient was called and informed of lab results, and medication being sent to pharmacy. Patient would like to get a handicap sticker.

## 2018-10-23 ENCOUNTER — Ambulatory Visit
Admission: RE | Admit: 2018-10-23 | Discharge: 2018-10-23 | Disposition: A | Discharge status: Home / Self Care | Payer: BLUE CROSS/BLUE SHIELD | Source: Ambulatory Visit | Attending: Family Medicine | Admitting: Family Medicine

## 2018-10-23 DIAGNOSIS — Z1239 Encounter for other screening for malignant neoplasm of breast: Secondary | ICD-10-CM

## 2018-10-23 DIAGNOSIS — Z1231 Encounter for screening mammogram for malignant neoplasm of breast: Secondary | ICD-10-CM | POA: Diagnosis not present

## 2018-10-25 ENCOUNTER — Telehealth: Payer: Self-pay

## 2018-10-25 NOTE — Telephone Encounter (Signed)
-----   Message from [name], MD sent at 10/25/2018  9:50 AM EST ----- Mammogram is negative for malignancy

## 2018-10-25 NOTE — Telephone Encounter (Signed)
Patient was called and informed of mammogram results 

## 2018-12-08 ENCOUNTER — Encounter (HOSPITAL_COMMUNITY): Payer: Self-pay | Admitting: Emergency Medicine

## 2018-12-08 ENCOUNTER — Ambulatory Visit (HOSPITAL_COMMUNITY)
Admission: EM | Admit: 2018-12-08 | Discharge: 2018-12-08 | Disposition: A | Discharge status: Home / Self Care | Payer: BLUE CROSS/BLUE SHIELD | Attending: Family Medicine | Admitting: Family Medicine

## 2018-12-08 DIAGNOSIS — M5441 Lumbago with sciatica, right side: Secondary | ICD-10-CM | POA: Diagnosis not present

## 2018-12-08 MED ORDER — KETOROLAC TROMETHAMINE 30 MG/ML IJ SOLN
30.0000 mg | Freq: Once | INTRAMUSCULAR | Status: AC
  Administered 2018-12-08: 30 mg via INTRAMUSCULAR

## 2018-12-08 MED ORDER — KETOROLAC TROMETHAMINE 30 MG/ML IJ SOLN
INTRAMUSCULAR | Status: AC
  Filled 2018-12-08: qty 1

## 2018-12-08 MED ORDER — PREDNISONE 50 MG PO TABS: 50.0000 mg | ORAL_TABLET | Freq: Every day | ORAL | 0 refills | Status: DC

## 2018-12-08 NOTE — ED Provider Notes (Signed)
[facility]    CSN: [id number] Arrival date & time: 12/08/18  [place]     History   Chief Complaint Chief Complaint  Patient presents with  . Back Pain    HPI Megan Allen is a 63 y.o. female.   63 year old female with history of chronic back pain comes in for acute exacerbation for the past 3 days after bending over to pick up something. States pain is to bilateral lower back, radiation to her right leg. She has numbness/tingling of the lateral thigh. Denies saddle anesthesia. She has had a few episodes of incontinence, but states having trouble ambulating to the restroom as fast and could be contributing. She has been able to urinate and move her bowels normally since incontinence. She is able to walk, though with a limp. She has been taking muscle relaxants and topical medication with mild relief.     Past Medical History:  Diagnosis Date  . Chronic back pain   . Headache   . Murmur   . Muscle spasm     Patient Active Problem List   Diagnosis Date Noted  . Chronic migraine 05/29/2018  . Chronic nonintractable headache 01/30/2018  . Blurry vision 01/30/2018  . Back pain 10/13/2016  . Pain in joint, shoulder region 09/13/2016  . Pain in joint, ankle and foot 09/13/2016    Past Surgical History:  Procedure Laterality Date  . ABDOMINAL HYSTERECTOMY     partial    OB History   No obstetric history on file.      Home Medications    Prior to Admission medications   Medication Sig Start Date End Date Taking? Authorizing Provider  acetaminophen (TYLENOL 8 HOUR ARTHRITIS PAIN) 650 MG CR tablet Take 650 mg by mouth as needed for pain.    [provider]  amoxicillin (AMOXIL) 500 MG capsule Take 2 capsules (1,000 mg total) by mouth 2 (two) times daily. Take with omeprazole and clarithromycin Patient not taking: Reported on 12/08/2018 09/07/18   [name], MD  Ascorbic Acid (VITAMIN C) 1000 MG tablet Take 1,000 mg by mouth daily.     [provider]  aspirin EC 81 MG tablet Take 81 mg by mouth daily.    [provider]  bisacodyl (DULCOLAX) 5 MG EC tablet Take 5 mg by mouth daily as needed for moderate constipation.    [provider]  Calcium Carb-Cholecalciferol (CALCIUM 600+D) 600-800 MG-UNIT TABS Take 1 tablet by mouth daily.    [provider]  Cholecalciferol (VITAMIN D3 PO) Take 2,000 Units by mouth daily.    [provider]  clarithromycin (BIAXIN) 500 MG tablet Take 1 tablet (500 mg total) by mouth 2 (two) times daily. Take with omeprazole and amoxicillin 09/07/18   [name], MD  Cyanocobalamin (VITAMIN B-12 PO) Take 3,000 mcg by mouth daily.    [provider]  Diclofenac Sodium (PENNSAID) 2 % SOLN Place onto the skin daily.    [provider]  Diclofenac Sodium (PENNSAID) 2 % SOLN Place onto the skin.    [provider]  ECHINACEA PO Take 400 mg by mouth as needed.    [provider]  gabapentin (NEURONTIN) 300 MG capsule Take 1 capsule (300 mg total) by mouth at bedtime. 09/04/18   [name], MD  methocarbamol (ROBAXIN) 750 MG tablet Take two tablets daily as needed. 09/04/18   [name], MD  Multiple Vitamin (MULTIVITAMIN) capsule Take 1 capsule by mouth daily.  [provider]  naproxen sodium (ALEVE) 220 MG tablet Take 220 mg by mouth as needed.    [provider]  omeprazole (PRILOSEC) 20 MG capsule Take 1 capsule (20 mg total) by mouth 2 (two) times daily before a meal. 09/07/18   [name], MD  predniSONE (DELTASONE) 50 MG tablet Take 1 tablet (50 mg total) by mouth daily. 12/08/18   Tasia Catchings, Prim Morace V, PA-C  SUMAtriptan (IMITREX) 50 MG tablet Take 1 tab at onset of migraine.  May repeat in 2 hrs, if needed.  Max dose: 2 tabs/day. This is a 30 day prescription. 01/30/18   Marcial Pacas, MD  UNABLE TO FIND Take 1 tablet by mouth as needed. OTC Calms for simple nervous tension and occasional  sleeplessness.    [provider]    Family History Family History  Problem Relation Age of Onset  . Hyperlipidemia Mother   . Diabetes Father   . Diabetes Brother   . Heart disease Daughter   . Stroke Maternal Grandfather     Social History Social History   Tobacco Use  . Smoking status: Never Smoker  . Smokeless tobacco: Never Used  Substance Use Topics  . Alcohol use: No  . Drug use: No     Allergies   Caffeine   Review of Systems Review of Systems  Reason unable to perform ROS: See HPI as above.     Physical Exam Triage Vital Signs ED Triage Vitals [12/08/18 1648]  Enc Vitals Group     BP 129/83     Pulse Rate 77     Resp 18     Temp 98.6 F (37 C)     Temp Source Temporal     SpO2 100 %     Weight      Height      Head Circumference      Peak Flow      Pain Score 7     Pain Loc      Pain Edu?      Excl. in [place]?    No data found.  Updated Vital Signs BP 129/83 (BP Location: Right Arm)   Pulse 77   Temp 98.6 F (37 C) (Temporal)   Resp 18   SpO2 100%   Physical Exam Constitutional:      General: She is not in acute distress.    Appearance: She is well-developed. She is not diaphoretic.  HENT:     Head: Normocephalic and atraumatic.  Eyes:     Conjunctiva/sclera: Conjunctivae normal.     Pupils: Pupils are equal, round, and reactive to light.  Cardiovascular:     Rate and Rhythm: Normal rate and regular rhythm.     Heart sounds: Normal heart sounds. No murmur. No friction rub. No gallop.   Pulmonary:     Effort: Pulmonary effort is normal. No accessory muscle usage or respiratory distress.     Breath sounds: Normal breath sounds. No stridor. No decreased breath sounds, wheezing, rhonchi or rales.  Musculoskeletal:     Comments: No tenderness on palpation of the spinous processes. Tenderness to palpation of bilateral lumbar region, particularly along the paraspinal area. Decreased flexion of the back. Difficult active ROM of  hip due to back pain. Full passive ROM of hips. Strength deferred. Sensation 4/5 of right lateral thigh, 5/5 for the rest of thigh, and lower extremity. Negative straight leg raise.   Skin:    General: Skin is warm and dry.  Neurological:  Mental Status: She is alert and oriented to person, place, and time.      UC Treatments / Results  Labs (all labs ordered are listed, but only abnormal results are displayed) Labs Reviewed - No data to display  EKG None  Radiology No results found.  Procedures Procedures (including critical care time)  Medications Ordered in UC Medications  ketorolac (TORADOL) 30 MG/ML injection 30 mg (30 mg Intramuscular Given 12/08/18 1725)    Initial Impression / Assessment and Plan / UC Course  I have reviewed the triage vital signs and the nursing notes.  Pertinent labs & imaging results that were available during my care of the patient were reviewed by me and considered in my medical decision making (see chart for details).    Toradol injection in office today.  Prednisone as directed.  Patient can continue muscle relaxant as needed.  Return precautions given.  Patient expresses understanding and agrees to plan.  Final Clinical Impressions(s) / UC Diagnoses   Final diagnoses:  Acute bilateral low back pain with right-sided sciatica    ED Prescriptions    Medication Sig Dispense Auth. Provider   predniSONE (DELTASONE) 50 MG tablet Take 1 tablet (50 mg total) by mouth daily. 5 tablet Tobin Chad, [place] 12/08/18 1727

## 2018-12-08 NOTE — Discharge Instructions (Signed)
Toradol injection in office today.  Start prednisone as directed.  You can continue Robaxin as needed.  Warm compress.  Follow-up with PCP for further evaluation if symptoms not improving.  If experiencing numbness/tingling of the inner thighs, loss of bladder or bowel control, go to the emergency department for further evaluation.

## 2018-12-08 NOTE — ED Triage Notes (Signed)
Pt sts lower back pain with radiation to right leg x 3 days since bending over

## 2019-11-07 ENCOUNTER — Ambulatory Visit: Payer: BC Managed Care – PPO | Attending: Internal Medicine

## 2019-11-07 DIAGNOSIS — Z20822 Contact with and (suspected) exposure to covid-19: Secondary | ICD-10-CM

## 2019-11-08 LAB — NOVEL CORONAVIRUS, NAA: SARS-CoV-2, NAA: NOT DETECTED

## 2019-12-26 ENCOUNTER — Ambulatory Visit: Payer: Self-pay | Attending: Internal Medicine

## 2019-12-26 DIAGNOSIS — Z23 Encounter for immunization: Secondary | ICD-10-CM | POA: Insufficient documentation

## 2019-12-26 NOTE — Progress Notes (Signed)
   Covid-19 Vaccination Clinic  Name:  Megan Allen    MRN: [id number] DOB: 1956-03-16  12/26/2019  [name] was observed post Covid-19 immunization for 15 minutes without incident. She was provided with Vaccine Information Sheet and instruction to access the V-Safe system.   [name] was instructed to call 911 with any severe reactions post vaccine: Marland Kitchen Difficulty breathing  . Swelling of face and throat  . A fast heartbeat  . A bad rash all over body  . Dizziness and weakness   Immunizations Administered    Name Date Dose VIS Date Route   Pfizer COVID-19 Vaccine 12/26/2019 10:28 AM 0.3 mL 10/04/2019 Intramuscular   Manufacturer: [place]   Lot: [id number]   [place]: [id number]

## 2020-01-21 ENCOUNTER — Ambulatory Visit: Payer: Self-pay | Attending: Internal Medicine

## 2020-01-21 DIAGNOSIS — Z23 Encounter for immunization: Secondary | ICD-10-CM

## 2020-01-21 NOTE — Progress Notes (Signed)
   Covid-19 Vaccination Clinic  Name:  Megan Allen    MRN: [id number] DOB: 01-11-1956  01/21/2020  Megan Allen was observed post Covid-19 immunization for 15 minutes without incident. She was provided with Vaccine Information Sheet and instruction to access the V-Safe system.   Megan Allen was instructed to call 911 with any severe reactions post vaccine: Marland Kitchen Difficulty breathing  . Swelling of face and throat  . A fast heartbeat  . A bad rash all over body  . Dizziness and weakness   Immunizations Administered    Name Date Dose VIS Date Route   Pfizer COVID-19 Vaccine 01/21/2020  2:52 PM 0.3 mL 10/04/2019 Intramuscular   Manufacturer: [place]   Lot: [id number]   [place]: [id number]

## 2020-04-07 ENCOUNTER — Other Ambulatory Visit: Payer: Self-pay | Admitting: Family Medicine

## 2020-05-03 ENCOUNTER — Emergency Department (HOSPITAL_BASED_OUTPATIENT_CLINIC_OR_DEPARTMENT_OTHER): Payer: 59

## 2020-05-03 ENCOUNTER — Other Ambulatory Visit: Payer: Self-pay

## 2020-05-03 ENCOUNTER — Emergency Department (HOSPITAL_BASED_OUTPATIENT_CLINIC_OR_DEPARTMENT_OTHER)
Admission: EM | Admit: 2020-05-03 | Discharge: 2020-05-03 | Disposition: A | Discharge status: Home / Self Care | Payer: 59 | Attending: Emergency Medicine | Admitting: Emergency Medicine

## 2020-05-03 ENCOUNTER — Encounter (HOSPITAL_BASED_OUTPATIENT_CLINIC_OR_DEPARTMENT_OTHER): Payer: Self-pay | Admitting: Emergency Medicine

## 2020-05-03 DIAGNOSIS — Z7982 Long term (current) use of aspirin: Secondary | ICD-10-CM | POA: Insufficient documentation

## 2020-05-03 DIAGNOSIS — R42 Dizziness and giddiness: Secondary | ICD-10-CM

## 2020-05-03 LAB — HEPATIC FUNCTION PANEL
ALT: 18 U/L (ref 0–44)
AST: 18 U/L (ref 15–41)
Albumin: 3.9 g/dL (ref 3.5–5.0)
Alkaline Phosphatase: 63 U/L (ref 38–126)
Bilirubin, Direct: 0.1 mg/dL (ref 0.0–0.2)
Indirect Bilirubin: 0.1 mg/dL — ABNORMAL LOW (ref 0.3–0.9)
Total Bilirubin: 0.2 mg/dL — ABNORMAL LOW (ref 0.3–1.2)
Total Protein: 6.7 g/dL (ref 6.5–8.1)

## 2020-05-03 LAB — URINALYSIS, ROUTINE W REFLEX MICROSCOPIC
Bilirubin Urine: NEGATIVE
Glucose, UA: NEGATIVE mg/dL
Hgb urine dipstick: NEGATIVE
Ketones, ur: NEGATIVE mg/dL
Leukocytes,Ua: NEGATIVE
Nitrite: NEGATIVE
Protein, ur: NEGATIVE mg/dL
Specific Gravity, Urine: 1.02 (ref 1.005–1.030)
pH: 6.5 (ref 5.0–8.0)

## 2020-05-03 LAB — TROPONIN I (HIGH SENSITIVITY): Troponin I (High Sensitivity): 3 ng/L (ref <18)

## 2020-05-03 LAB — BASIC METABOLIC PANEL
Anion gap: 10 (ref 5–15)
BUN: 28 mg/dL — ABNORMAL HIGH (ref 8–23)
CO2: 25 mmol/L (ref 22–32)
Calcium: 8.9 mg/dL (ref 8.9–10.3)
Chloride: 102 mmol/L (ref 98–111)
Creatinine, Ser: 0.79 mg/dL (ref 0.44–1.00)
GFR calc Af Amer: >60 mL/min (ref >60)
GFR calc non Af Amer: >60 mL/min (ref >60)
Glucose, Bld: 128 mg/dL — ABNORMAL HIGH (ref 70–99)
Potassium: 4.2 mmol/L (ref 3.5–5.1)
Sodium: 137 mmol/L (ref 135–145)

## 2020-05-03 LAB — CBC
HCT: 40.7 % (ref 36.0–46.0)
Hemoglobin: 13.5 g/dL (ref 12.0–15.0)
MCH: 30.4 pg (ref 26.0–34.0)
MCHC: 33.2 g/dL (ref 30.0–36.0)
MCV: 91.7 fL (ref 80.0–100.0)
Platelets: 188 10*3/uL (ref 150–400)
RBC: 4.44 MIL/uL (ref 3.87–5.11)
RDW: 13 % (ref 11.5–15.5)
WBC: 6.7 10*3/uL (ref 4.0–10.5)
nRBC: 0 % (ref 0.0–0.2)

## 2020-05-03 MED ORDER — SODIUM CHLORIDE 0.9 % IV BOLUS
1000.0000 mL | Freq: Once | INTRAVENOUS | Status: AC
  Administered 2020-05-03: 1000 mL via INTRAVENOUS

## 2020-05-03 MED ORDER — MECLIZINE HCL 12.5 MG PO TABS
12.5000 mg | ORAL_TABLET | Freq: Three times a day (TID) | ORAL | 0 refills | Status: DC | PRN
Start: 2020-05-03 — End: 2022-05-10

## 2020-05-03 NOTE — Discharge Instructions (Signed)
Your work-up today was reassuring.  Make sure you drink plenty of fluids and get rest.  I would recommend discussing your medication regimen with your PCP to ensure that you are on all the appropriate supplements.  You can try taking meclizine which is a medication for dizziness up to 3 times daily as needed for dizziness.  Be aware this medication can cause drowsiness.  Follow-up with your primary care provider for reevaluation of your symptoms.  Return to the emergency department if any concerning signs or symptoms develop such as vision changes, loss of consciousness, fevers, chest pain or shortness of breath.

## 2020-05-03 NOTE — ED Notes (Signed)
Pt c/o "feeling funny in her chest". EDP made aware. Trop added to [employer]

## 2020-05-03 NOTE — ED Triage Notes (Signed)
Patient states that for the last week she has had intermittent dizziness and low BP as monitored at home. The patient states that when she stands she is dizzy, but also dizzy all the time

## 2020-05-03 NOTE — ED Provider Notes (Signed)
[facility] Provider Note   CSN: [id number] Arrival date & time: 05/03/20  1310     History Chief Complaint  Patient presents with  . Dizziness    Megan Allen is a 64 y.o. female with history of migraine headaches, back pain presents for evaluation of gradual onset, intermittent but worsening generalized weakness, dizziness for 1 week.  She reports that as she developed the symptoms she began to check her blood pressure (she reports no history of hypertension) and noted her pressures to be low.  She states her blood pressure is typically low normal but she has had several blood pressures with systolics below 80.  She has tried to stay hydrated and notes that she has been urinating more as a result.  She notes some intermittent crampy abdominal pains that occur with meals.  She states that when her blood pressures are low she will develop a mild frontal headache.  Daughter noted that she was a little confused, slower to answer questions and having some word forming difficulty as well.  Patient has felt unsteady with ambulation notes that her legs feel "heavy".  She states that she feels the feeling of weakness and unsteadiness even at rest at this point but difficult to tell exactly when this happened.  She takes a lot of homeopathic supplements.  She also fasts intermittently on a monthly basis typically for 3 days at a time.  The history is provided by the patient.       Past Medical History:  Diagnosis Date  . Chronic back pain   . Headache   . Murmur   . Muscle spasm     Patient Active Problem List   Diagnosis Date Noted  . Chronic migraine 05/29/2018  . Chronic nonintractable headache 01/30/2018  . Blurry vision 01/30/2018  . Back pain 10/13/2016  . Pain in joint, shoulder region 09/13/2016  . Pain in joint, ankle and foot 09/13/2016    Past Surgical History:  Procedure Laterality Date  . ABDOMINAL HYSTERECTOMY     partial      OB History   No obstetric history on file.     Family History  Problem Relation Age of Onset  . Hyperlipidemia Mother   . Diabetes Father   . Diabetes Brother   . Heart disease Daughter   . Stroke Maternal Grandfather     Social History   Tobacco Use  . Smoking status: Never Smoker  . Smokeless tobacco: Never Used  Vaping Use  . Vaping Use: Never used  Substance Use Topics  . Alcohol use: No  . Drug use: No    Home Medications Prior to Admission medications   Medication Sig Start Date End Date Taking? Authorizing Provider  acetaminophen (TYLENOL 8 HOUR ARTHRITIS PAIN) 650 MG CR tablet Take 650 mg by mouth as needed for pain.    [provider]  amoxicillin (AMOXIL) 500 MG capsule Take 2 capsules (1,000 mg total) by mouth 2 (two) times daily. Take with omeprazole and clarithromycin Patient not taking: Reported on 12/08/2018 09/07/18   [name], MD  Ascorbic Acid (VITAMIN C) 1000 MG tablet Take 1,000 mg by mouth daily.    [provider]  aspirin EC 81 MG tablet Take 81 mg by mouth daily.    [provider]  bisacodyl (DULCOLAX) 5 MG EC tablet Take 5 mg by mouth daily as needed for moderate constipation.    [provider]  Calcium Carb-Cholecalciferol (CALCIUM 600+D) 600-800  MG-UNIT TABS Take 1 tablet by mouth daily.    [provider]  Cholecalciferol (VITAMIN D3 PO) Take 2,000 Units by mouth daily.    [provider]  clarithromycin (BIAXIN) 500 MG tablet Take 1 tablet (500 mg total) by mouth 2 (two) times daily. Take with omeprazole and amoxicillin 09/07/18   [name], MD  Cyanocobalamin (VITAMIN B-12 PO) Take 3,000 mcg by mouth daily.    [provider]  Diclofenac Sodium (PENNSAID) 2 % SOLN Place onto the skin daily.    [provider]  Diclofenac Sodium (PENNSAID) 2 % SOLN Place onto the skin.    [provider]  ECHINACEA PO Take 400 mg by mouth as needed.    [provider]  gabapentin (NEURONTIN) 300 MG capsule Take 1 capsule (300 mg total) by mouth at bedtime. 09/04/18   [name], MD  meclizine (ANTIVERT) 12.5 MG tablet Take 1 tablet (12.5 mg total) by mouth 3 (three) times daily as needed for dizziness. 05/03/20   Tanikka Bresnan A, PA-C  methocarbamol (ROBAXIN) 750 MG tablet Take two tablets daily as needed. 09/04/18   [name], MD  Multiple Vitamin (MULTIVITAMIN) capsule Take 1 capsule by mouth daily.    [provider]  naproxen sodium (ALEVE) 220 MG tablet Take 220 mg by mouth as needed.    [provider]  omeprazole (PRILOSEC) 20 MG capsule Take 1 capsule (20 mg total) by mouth 2 (two) times daily before a meal. 09/07/18   [name], MD  predniSONE (DELTASONE) 50 MG tablet Take 1 tablet (50 mg total) by mouth daily. 12/08/18   Tasia Catchings, Amy V, PA-C  SUMAtriptan (IMITREX) 50 MG tablet Take 1 tab at onset of migraine.  May repeat in 2 hrs, if needed.  Max dose: 2 tabs/day. This is a 30 day prescription. 01/30/18   Marcial Pacas, MD  UNABLE TO FIND Take 1 tablet by mouth as needed. OTC Calms for simple nervous tension and occasional sleeplessness.    [provider]    Allergies    Caffeine  Review of Systems   Review of Systems  Constitutional: Negative for chills and fever.  Gastrointestinal: Positive for abdominal pain. Negative for nausea and vomiting.  Genitourinary: Positive for frequency.  Neurological: Positive for dizziness, weakness (Generalized) and headaches.  All other systems reviewed and are negative.   Physical Exam Updated Vital Signs BP 117/76 (BP Location: Left Arm)   Pulse 75   Temp 98 F (36.7 C) (Oral)   Resp 16   Ht 5' 6.5" (1.689 m)   Wt 104.9 kg   SpO2 98%   BMI 36.77 kg/m   Physical Exam Vitals and nursing note reviewed.  Constitutional:      General: She is not in acute distress.    Appearance: She is well-developed.  HENT:     Head: Normocephalic and atraumatic.    Eyes:     General:        Right eye: No discharge.        Left eye: No discharge.     Conjunctiva/sclera: Conjunctivae normal.  Neck:     Vascular: No JVD.     Trachea: No tracheal deviation.  Cardiovascular:     Rate and Rhythm: Normal rate and regular rhythm.  Pulmonary:     Effort: Pulmonary effort is normal.     Breath sounds: Normal breath sounds.  Abdominal:     General: Bowel sounds are normal. There is no distension.  Palpations: Abdomen is soft.     Comments: Mild suprapubic discomfort on palpation of the abdomen  Skin:    General: Skin is warm and dry.     Findings: No erythema.  Neurological:     Mental Status: She is alert.     Comments: Mental Status:  Alert, thought content appropriate, able to give a coherent history.  Speech is a little slow but no evidence of aphasia. Able to follow 2 step commands without difficulty.  Cranial Nerves:  II:  Peripheral visual fields grossly normal, pupils equal, round, reactive to light III,IV, VI: ptosis not present, extra-ocular motions intact bilaterally  V,VII: smile symmetric, facial light touch sensation equal VIII: hearing grossly normal to voice  X: uvula elevates symmetrically  XI: bilateral shoulder shrug symmetric and strong XII: midline tongue extension without fassiculations Motor:  Normal tone. 5/5 strength of BUE and BLE major muscle groups including strong and equal grip strength and dorsiflexion/plantar flexion, no pronator drift Sensory: light touch normal in all extremities. Cerebellar: normal finger-to-nose with bilateral upper extremities, Romberg sign absent Gait: Ambulatory with steady but slow gait, exhibits fair balance.   Psychiatric:        Behavior: Behavior normal.     ED Results / Procedures / Treatments   Labs (all labs ordered are listed, but only abnormal results are displayed) Labs Reviewed  BASIC METABOLIC PANEL - Abnormal; Notable for the following components:      Result Value    Glucose, Bld 128 (*)    BUN 28 (*)    All other components within normal limits  HEPATIC FUNCTION PANEL - Abnormal; Notable for the following components:   Total Bilirubin 0.2 (*)    Indirect Bilirubin 0.1 (*)    All other components within normal limits  CBC  URINALYSIS, ROUTINE W REFLEX MICROSCOPIC  TROPONIN I (HIGH SENSITIVITY)    EKG EKG Interpretation  Date/Time:  Sunday May 03 2020 13:50:44 EDT Ventricular Rate:  79 PR Interval:    QRS Duration: 83 QT Interval:  383 QTC Calculation: 439 R Axis:   65 Text Interpretation: Sinus rhythm Borderline prolonged PR interval RAE, consider biatrial enlargement Abnormal R-wave progression, early transition No significant change since last tracing Confirmed by Dorie Rank [phone]) on 05/03/2020 1:53:51 PM   Radiology DG Chest 2 View  Result Date: 05/03/2020 CLINICAL DATA:  Weakness, hypotension EXAM: CHEST - 2 VIEW COMPARISON:  02/18/2012 FINDINGS: The heart size and mediastinal contours are within normal limits. Both lungs are clear. The visualized skeletal structures are unremarkable. IMPRESSION: No active cardiopulmonary disease. Electronically Signed   By: Randa Ngo M.D.   On: 05/03/2020 15:11   CT Head Wo Contrast  Result Date: 05/03/2020 CLINICAL DATA:  Dizziness EXAM: CT HEAD WITHOUT CONTRAST TECHNIQUE: Contiguous axial images were obtained from the base of the skull through the vertex without intravenous contrast. COMPARISON:  None. FINDINGS: Brain: Ventricles are normal in size and configuration. There is slight frontal and parietal lobe atrophy. There is no intracranial mass, hemorrhage, extra-axial fluid collection, or midline shift. The brain parenchyma appears unremarkable. No evident acute infarct. Vascular: No hyperdense vessel. There is mild calcification in each carotid siphon region. Skull: The bony calvarium appears intact. Sinuses/Orbits: Visualized paranasal sinuses are clear. Visualized orbits appear symmetric  bilaterally. Other: Mastoid air cells are clear. IMPRESSION: Mild frontal and parietal lobe atrophy. Ventricles normal in size and configuration. Brain parenchyma appears unremarkable. No mass or hemorrhage. There is slight arterial vascular calcification. Electronically Signed  By: Lowella Grip III M.D.   On: 05/03/2020 15:13    Procedures Procedures (including critical care time)  Medications Ordered in ED Medications  sodium chloride 0.9 % bolus 1,000 mL (1,000 mLs Intravenous New Bag/Given 05/03/20 1440)    ED Course  I have reviewed the triage vital signs and the nursing notes.  Pertinent labs & imaging results that were available during my care of the patient were reviewed by me and considered in my medical decision making (see chart for details).    MDM Rules/Calculators/A&P                          Patient presenting for evaluation of generalized weakness, intermittent dizziness which she describes as feeling unsteady with ambulation and lightheaded.  She has checked her blood pressure at home and noted a few hypotensive readings.  While in the ED she is afebrile and vital signs are stable.  Orthostatic vital signs were obtained and she is not orthostatic.  She has a normal neurologic examination with no focal deficits.  She is ambulatory with steady gait and exhibits good balance, though walks slowly.  She does not need any assistance with ambulation.  Lab work reviewed and interpreted by myself shows no leukocytosis, no anemia, no metabolic derangements, no renal insufficiency.  UA does not suggest UTI or nephrolithiasis.  Chest x-ray does not suggest pneumonia or other acute cardiopulmonary abnormalities.  Head CT was obtained which shows mild frontal and parietal lobe atrophy but no acute intracranial hemorrhage, mass or other abnormalities.  EKG shows no significant changes from last tracing.  While in the ED while she was receiving IV fluids she began complaining of atypical  fluttery feeling in her chest so a troponin was obtained which was within normal limits.  Doubt ACS/MI at this time.  Several of her symptoms sound consistent with peripheral vertigo.  With reassuring work-up we will recommend trialing a course of meclizine.  Recommend follow-up with PCP for reevaluation of symptoms.  Patient was seen and evaluated by Dr. Sedonia Small who agrees with assessment and plan at this time.  Patient hemodynamically stable for discharge at this time.  Final Clinical Impression(s) / ED Diagnoses Final diagnoses:  Dizziness    Rx / DC Orders ED Discharge Orders         Ordered    meclizine (ANTIVERT) 12.5 MG tablet  3 times daily PRN     Discontinue  Reprint     05/03/20 1557           Renita Papa, PA-C 05/03/20 1603    Maudie Flakes, MD 05/03/20 2336

## 2020-05-12 ENCOUNTER — Ambulatory Visit: Payer: 59 | Attending: Family Medicine | Admitting: Family Medicine

## 2020-05-12 ENCOUNTER — Other Ambulatory Visit: Payer: Self-pay

## 2020-05-12 VITALS — BP 118/74 | HR 74 | Ht 66.0 in | Wt 219.4 lb

## 2020-05-12 DIAGNOSIS — M5441 Lumbago with sciatica, right side: Secondary | ICD-10-CM | POA: Diagnosis not present

## 2020-05-12 DIAGNOSIS — M5412 Radiculopathy, cervical region: Secondary | ICD-10-CM

## 2020-05-12 DIAGNOSIS — Z131 Encounter for screening for diabetes mellitus: Secondary | ICD-10-CM

## 2020-05-12 DIAGNOSIS — R140 Abdominal distension (gaseous): Secondary | ICD-10-CM | POA: Diagnosis not present

## 2020-05-12 DIAGNOSIS — G8929 Other chronic pain: Secondary | ICD-10-CM

## 2020-05-12 LAB — POCT GLYCOSYLATED HEMOGLOBIN (HGB A1C): HbA1c, POC (controlled diabetic range): 5.6 % (ref 0.0–7.0)

## 2020-05-12 MED ORDER — METHOCARBAMOL 750 MG PO TABS: 750.0000 mg | ORAL_TABLET | Freq: Three times a day (TID) | ORAL | 1 refills | Status: DC | PRN

## 2020-05-12 MED FILL — METHOCARBAMOL 750 MG TABS: 750 | 30 days supply | Qty: 90 | Fill #0

## 2020-05-12 NOTE — Progress Notes (Signed)
States that she has not been feeling well at all.  When she woke this morning her left arm was numb.  Having back pain.

## 2020-05-12 NOTE — Progress Notes (Signed)
Subjective:  Patient ID: Melford Aase, female    DOB: July 03, 1956  Age: 64 y.o. MRN: [id number]  CC:  Chief Complaint  Patient presents with  . Numbness     HPI Zela Seegars Shankman was scheduled for Medicare wellness exam but turned out she had acute concerns today.  She woke up with a L arm numbness and catching in her posterior neck so she took an ASA and a tylenol with some improvement. Lifting her arm felt heavy and she had difficulty with her grip. She is right handed. Denies weakness in other body parts or difficulty with her speech. Now she feels weak and drained and off and on since her last visit. She has felt lightheaded but states she is not dizzy and the room is not spinning and has no vertigo but has not taken her meclizine.  Lower back is still hurting with associated numbness in legs; she is being followed by a back specialist.  Was scheduled to receive epidural spinal injection which she declined and informs me the Lord told her not to take it. Also complains of abdominal bloating, discomfort. Past Medical History:  Diagnosis Date  . Chronic back pain   . Headache   . Murmur   . Muscle spasm     Past Surgical History:  Procedure Laterality Date  . ABDOMINAL HYSTERECTOMY     partial    Family History  Problem Relation Age of Onset  . Hyperlipidemia Mother   . Diabetes Father   . Diabetes Brother   . Heart disease Daughter   . Stroke Maternal Grandfather     Allergies  Allergen Reactions  . Caffeine     Chest pain, heart racing    Outpatient Medications Prior to Visit  Medication Sig Dispense Refill  . acetaminophen (TYLENOL 8 HOUR ARTHRITIS PAIN) 650 MG CR tablet Take 650 mg by mouth as needed for pain.    . Ascorbic Acid (VITAMIN C) 1000 MG tablet Take 1,000 mg by mouth daily.    Marland Kitchen aspirin EC 81 MG tablet Take 81 mg by mouth daily.    . bisacodyl (DULCOLAX) 5 MG EC tablet Take 5 mg by mouth daily as needed for moderate constipation.     . Calcium Carb-Cholecalciferol (CALCIUM 600+D) 600-800 MG-UNIT TABS Take 1 tablet by mouth daily.    . Cholecalciferol (VITAMIN D3 PO) Take 2,000 Units by mouth daily.    . Cyanocobalamin (VITAMIN B-12 PO) Take 3,000 mcg by mouth daily.    . Diclofenac Sodium (PENNSAID) 2 % SOLN Place onto the skin daily.    . Diclofenac Sodium (PENNSAID) 2 % SOLN Place onto the skin.    Marland Kitchen ECHINACEA PO Take 400 mg by mouth as needed.    . meclizine (ANTIVERT) 12.5 MG tablet Take 1 tablet (12.5 mg total) by mouth 3 (three) times daily as needed for dizziness. 10 tablet 0  . methocarbamol (ROBAXIN) 750 MG tablet Take two tablets daily as needed. 60 tablet 3  . Multiple Vitamin (MULTIVITAMIN) capsule Take 1 capsule by mouth daily.    . naproxen sodium (ALEVE) 220 MG tablet Take 220 mg by mouth as needed.    Marland Kitchen omeprazole (PRILOSEC) 20 MG capsule Take 1 capsule (20 mg total) by mouth 2 (two) times daily before a meal. 28 capsule 0  . SUMAtriptan (IMITREX) 50 MG tablet Take 1 tab at onset of migraine.  May repeat in 2 hrs, if needed.  Max dose: 2 tabs/day. This is a 98  day prescription. 12 tablet 6  . UNABLE TO FIND Take 1 tablet by mouth as needed. OTC Calms for simple nervous tension and occasional sleeplessness.    Marland Kitchen amoxicillin (AMOXIL) 500 MG capsule Take 2 capsules (1,000 mg total) by mouth 2 (two) times daily. Take with omeprazole and clarithromycin (Patient not taking: Reported on 12/08/2018) 56 capsule 0  . clarithromycin (BIAXIN) 500 MG tablet Take 1 tablet (500 mg total) by mouth 2 (two) times daily. Take with omeprazole and amoxicillin (Patient not taking: Reported on 05/12/2020) 28 tablet 0  . gabapentin (NEURONTIN) 300 MG capsule Take 1 capsule (300 mg total) by mouth at bedtime. (Patient not taking: Reported on 05/12/2020) 60 capsule 3  . predniSONE (DELTASONE) 50 MG tablet Take 1 tablet (50 mg total) by mouth daily. (Patient not taking: Reported on 05/12/2020) 5 tablet 0   No facility-administered  medications prior to visit.     ROS Review of Systems  Constitutional: Positive for fatigue. Negative for activity change and appetite change.  HENT: Negative for congestion, sinus pressure and sore throat.   Eyes: Negative for visual disturbance.  Respiratory: Negative for cough, chest tightness, shortness of breath and wheezing.   Cardiovascular: Negative for chest pain and palpitations.  Gastrointestinal: Negative for abdominal distention, abdominal pain and constipation.  Endocrine: Negative for polydipsia.  Genitourinary: Negative for dysuria and frequency.  Musculoskeletal: Negative for arthralgias and back pain.  Skin: Negative for rash.  Neurological: Positive for numbness. Negative for tremors and light-headedness.  Hematological: Does not bruise/bleed easily.  Psychiatric/Behavioral: Negative for agitation and behavioral problems.    Objective:  BP 118/74   Pulse 74   Ht 5\' 6"  (1.676 m)   Wt 219 lb 6.4 oz (99.5 kg)   SpO2 99%   BMI 35.41 kg/m   BP/Weight 05/12/2020 05/03/2020 8/41/6606  Systolic BP 301 601 093  Diastolic BP 74 77 83  Wt. (Lbs) 219.4 231.26 -  BMI 35.41 36.77 -      Physical Exam Constitutional:      Appearance: She is well-developed.  Neck:     Vascular: No JVD.  Cardiovascular:     Rate and Rhythm: Normal rate.     Heart sounds: Normal heart sounds. No murmur heard.   Pulmonary:     Effort: Pulmonary effort is normal.     Breath sounds: Normal breath sounds. No wheezing or rales.  Chest:     Chest wall: No tenderness.  Abdominal:     General: Bowel sounds are normal. There is no distension.     Palpations: Abdomen is soft. There is no mass.     Tenderness: There is no abdominal tenderness.  Musculoskeletal:        General: Normal range of motion.     Right lower leg: No edema.     Left lower leg: No edema.     Comments: Negative Hawkins and Neer signs  Neurological:     Mental Status: She is alert and oriented to person, place,  and time.     Cranial Nerves: No cranial nerve deficit.     Sensory: No sensory deficit.     Motor: No weakness.     Coordination: Coordination normal.     Gait: Gait normal.     Deep Tendon Reflexes: Reflexes normal.     Comments: No pronator drift, normal handgrip  Psychiatric:        Mood and Affect: Mood normal.     CMP Latest Ref Rng & Units  05/03/2020 11/28/2017 09/13/2016  Glucose 70 - 99 mg/dL 128(H) 77 88  BUN 8 - 23 mg/dL 28(H) 13 11  Creatinine 0.44 - 1.00 mg/dL 0.79 0.75 0.82  Sodium 135 - 145 mmol/L 137 142 140  Potassium 3.5 - 5.1 mmol/L 4.2 4.0 3.9  Chloride 98 - 111 mmol/L 102 100 105  CO2 22 - 32 mmol/L 25 26 27   Calcium 8.9 - 10.3 mg/dL 8.9 9.9 9.2  Total Protein 6.5 - 8.1 g/dL 6.7 6.9 6.4  Total Bilirubin 0.3 - 1.2 mg/dL 0.2(L) 0.4 0.5  Alkaline Phos 38 - 126 U/L 63 72 61  AST 15 - 41 U/L 18 16 16   ALT 0 - 44 U/L 18 18 15     Lipid Panel     Component Value Date/Time   CHOL 191 11/28/2017 1653   TRIG 62 11/28/2017 1653   HDL 64 11/28/2017 1653   CHOLHDL 3.0 11/28/2017 1653   CHOLHDL 3.5 09/13/2016 1202   VLDL 12 09/13/2016 1202   LDLCALC 115 (H) 11/28/2017 1653    CBC    Component Value Date/Time   WBC 6.7 05/03/2020 1344   RBC 4.44 05/03/2020 1344   HGB 13.5 05/03/2020 1344   HCT 40.7 05/03/2020 1344   PLT 188 05/03/2020 1344   MCV 91.7 05/03/2020 1344   MCH 30.4 05/03/2020 1344   MCHC 33.2 05/03/2020 1344   RDW 13.0 05/03/2020 1344   LYMPHSABS 1,960 09/13/2016 1202   MONOABS 448 09/13/2016 1202   EOSABS 56 09/13/2016 1202   BASOSABS 0 09/13/2016 1202    Lab Results  Component Value Date   HGBA1C 5.6 05/12/2020     Assessment & Plan:   1. Abdominal bloating Previously treated for H. pylori in the past We will check again - H. pylori breath test  2. Screening for diabetes mellitus Given numbness screen for diabetes performed which is negative - POCT glycosylated hemoglobin (Hb A1C)  3. Chronic right-sided low back pain with  right-sided sciatica No acute flares Followed by spine specialist - methocarbamol (ROBAXIN) 750 MG tablet; Take 1 tablet (750 mg total) by mouth every 8 (eight) hours as needed for muscle spasms.  Dispense: 90 tablet; Refill: 1  4. Cervical radiculopathy This could explain numbness in her left arm No signs concerning for CVA at this time Refill Robaxin Advised to apply heating pad to neck   Return in about 1 month (around 06/12/2020) for medicare wellness.  [name], MD, FAAFP. [facility] [place], [place]   05/12/2020, 9:38 AM

## 2020-05-12 NOTE — Patient Instructions (Signed)
Abdominal Bloating When you have abdominal bloating, your abdomen may feel full, tight, or painful. It may also look bigger than normal or swollen (distended). Common causes of abdominal bloating include:  Swallowing air.  Constipation.  Problems digesting food.  Eating too much.  Irritable bowel syndrome. This is a condition that affects the large intestine.  Lactose intolerance. This is an inability to digest lactose, a natural sugar in dairy products.  Celiac disease. This is a condition that affects the ability to digest gluten, a protein found in some grains.  Gastroparesis. This is a condition that slows down the movement of food in the stomach and small intestine. It is more common in people with diabetes mellitus.  Gastroesophageal reflux disease (GERD). This is a digestive condition that makes stomach acid flow back into the esophagus.  Urinary retention. This means that the body is holding onto urine, and the bladder cannot be emptied all the way. Follow these instructions at home: Eating and drinking  Avoid eating too much.  Try not to swallow air while talking or eating.  Avoid eating while lying down.  Avoid these foods and drinks: ? Foods that cause gas, such as broccoli, cabbage, cauliflower, and baked beans. ? Carbonated drinks. ? Hard candy. ? Chewing gum. Medicines  Take over-the-counter and prescription medicines only as told by your health care provider.  Take probiotic medicines. These medicines contain live bacteria or yeasts that can help digestion.  Take coated peppermint oil capsules. Activity  Try to exercise regularly. Exercise may help to relieve bloating that is caused by gas and relieve constipation. General instructions  Keep all follow-up visits as told by your health care provider. This is important. Contact a health care provider if:  You have nausea and vomiting.  You have diarrhea.  You have abdominal pain.  You have unusual  weight loss or weight gain.  You have severe pain, and medicines do not help. Get help right away if:  You have severe chest pain.  You have trouble breathing.  You have shortness of breath.  You have trouble urinating.  You have darker urine than normal.  You have blood in your stools or have dark, tarry stools. Summary  Abdominal bloating means that the abdomen is swollen.  Common causes of abdominal bloating are swallowing air, constipation, and problems digesting food.  Avoid eating too much and avoid swallowing air.  Avoid foods that cause gas, carbonated drinks, hard candy, and chewing gum. This information is not intended to replace advice given to you by your health care provider. Make sure you discuss any questions you have with your health care provider. Document Revised: 01/28/2019 Document Reviewed: 11/11/2016 Elsevier Patient Education  2020 [employer]  

## 2020-05-13 ENCOUNTER — Encounter: Payer: Self-pay | Admitting: Family Medicine

## 2020-05-14 LAB — H. PYLORI BREATH TEST: H pylori Breath Test: NEGATIVE

## 2020-05-21 ENCOUNTER — Telehealth: Payer: Self-pay

## 2020-05-21 NOTE — Telephone Encounter (Signed)
Patient was given negative lab results via voicemail.

## 2020-05-21 NOTE — Telephone Encounter (Signed)
-----   Message from [name], MD sent at 05/14/2020  8:43 PM EDT ----- H.Pylori is negative

## 2020-06-08 ENCOUNTER — Other Ambulatory Visit: Payer: Self-pay | Admitting: Family Medicine

## 2020-06-08 DIAGNOSIS — Z1231 Encounter for screening mammogram for malignant neoplasm of breast: Secondary | ICD-10-CM

## 2020-06-11 ENCOUNTER — Other Ambulatory Visit: Payer: Self-pay

## 2020-06-11 ENCOUNTER — Ambulatory Visit: Payer: 59 | Attending: Family Medicine | Admitting: Family Medicine

## 2020-06-11 ENCOUNTER — Encounter: Payer: Self-pay | Admitting: Family Medicine

## 2020-06-11 VITALS — BP 110/76 | HR 81 | Ht 66.0 in | Wt 219.0 lb

## 2020-06-11 DIAGNOSIS — Z1159 Encounter for screening for other viral diseases: Secondary | ICD-10-CM

## 2020-06-11 DIAGNOSIS — Z23 Encounter for immunization: Secondary | ICD-10-CM

## 2020-06-11 DIAGNOSIS — Z1211 Encounter for screening for malignant neoplasm of colon: Secondary | ICD-10-CM | POA: Diagnosis not present

## 2020-06-11 DIAGNOSIS — Z0000 Encounter for general adult medical examination without abnormal findings: Secondary | ICD-10-CM

## 2020-06-11 NOTE — Patient Instructions (Signed)
  Ms. Gearing , Thank you for taking time to come for your Medicare Wellness Visit. I appreciate your ongoing commitment to your health goals. Please review the following plan we discussed and let me know if I can assist you in the future.   These are the goals we discussed: Goals   None     This is a list of the screening recommended for you and due dates:  Health Maintenance  Topic Date Due  .  Hepatitis C: One time screening is recommended by Center for Disease Control  (CDC) for  adults born from [birth year] through 1965.   Never done  . HIV Screening  Never done  . Colon Cancer Screening  Never done  . Flu Shot  05/24/2020  . Mammogram  10/23/2020  . Tetanus Vaccine  07/28/2025  . COVID-19 Vaccine  Completed  . Pap Smear  Discontinued

## 2020-06-11 NOTE — Progress Notes (Signed)
Subjective:   Megan Allen is a 64 y.o. female who presents for Medicare Annual (Subsequent) preventive examination. Mammogram comes up in 2 weeks S/p hysterectomy  Review of Systems    General: negative for fever, weight loss, appetite change Eyes: no visual symptoms. ENT: no ear symptoms, no sinus tenderness, no nasal congestion or sore throat. Neck: no pain  Respiratory: no wheezing, shortness of breath, cough Cardiovascular: no chest pain, no dyspnea on exertion, no pedal edema, no orthopnea. Gastrointestinal: no abdominal pain, no diarrhea, no constipation Genito-Urinary: no urinary frequency, no dysuria, no polyuria. Hematologic: no bruising Endocrine: no cold or heat intolerance Neurological: no headaches, no seizures, no tremors Musculoskeletal: no joint pains, no joint swelling Skin: no pruritus, no rash. Psychological: no depression, no anxiety,          Objective:    Today's Vitals   06/11/20 0941  BP: 110/76  Pulse: 81  SpO2: 99%  Weight: 219 lb (99.3 kg)  Height: 5\' 6"  (1.676 m)   Body mass index is 35.35 kg/m.  Advanced Directives 06/11/2020 05/03/2020 09/04/2018 01/19/2017 12/14/2016 10/13/2016 09/13/2016  Does Patient Have a Medical Advance Directive? No No No No No No No  Would patient like information on creating a medical advance directive? - No - Patient declined No - Patient declined No - Patient declined No - Patient declined No - Patient declined No - Patient declined    Current Medications (verified) Outpatient Encounter Medications as of 06/11/2020  Medication Sig  . acetaminophen (TYLENOL 8 HOUR ARTHRITIS PAIN) 650 MG CR tablet Take 650 mg by mouth as needed for pain.  . Ascorbic Acid (VITAMIN C) 1000 MG tablet Take 1,000 mg by mouth daily.  Marland Kitchen aspirin EC 81 MG tablet Take 81 mg by mouth daily.  . bisacodyl (DULCOLAX) 5 MG EC tablet Take 5 mg by mouth daily as needed for moderate constipation.  . Calcium Carb-Cholecalciferol (CALCIUM  600+D) 600-800 MG-UNIT TABS Take 1 tablet by mouth daily.  . Cholecalciferol (VITAMIN D3 PO) Take 2,000 Units by mouth daily.  . Cyanocobalamin (VITAMIN B-12 PO) Take 3,000 mcg by mouth daily.  . Diclofenac Sodium (PENNSAID) 2 % SOLN Place onto the skin daily.  . Diclofenac Sodium (PENNSAID) 2 % SOLN Place onto the skin.  Marland Kitchen ECHINACEA PO Take 400 mg by mouth as needed.  . meclizine (ANTIVERT) 12.5 MG tablet Take 1 tablet (12.5 mg total) by mouth 3 (three) times daily as needed for dizziness.  . methocarbamol (ROBAXIN) 750 MG tablet Take 1 tablet (750 mg total) by mouth every 8 (eight) hours as needed for muscle spasms.  . Multiple Vitamin (MULTIVITAMIN) capsule Take 1 capsule by mouth daily.  . naproxen sodium (ALEVE) 220 MG tablet Take 220 mg by mouth as needed.  . SUMAtriptan (IMITREX) 50 MG tablet Take 1 tab at onset of migraine.  May repeat in 2 hrs, if needed.  Max dose: 2 tabs/day. This is a 30 day prescription.  Marland Kitchen UNABLE TO FIND Take 1 tablet by mouth as needed. OTC Calms for simple nervous tension and occasional sleeplessness.  Marland Kitchen amoxicillin (AMOXIL) 500 MG capsule Take 2 capsules (1,000 mg total) by mouth 2 (two) times daily. Take with omeprazole and clarithromycin (Patient not taking: Reported on 12/08/2018)  . clarithromycin (BIAXIN) 500 MG tablet Take 1 tablet (500 mg total) by mouth 2 (two) times daily. Take with omeprazole and amoxicillin (Patient not taking: Reported on 05/12/2020)  . gabapentin (NEURONTIN) 300 MG capsule Take 1 capsule (300  mg total) by mouth at bedtime. (Patient not taking: Reported on 05/12/2020)  . omeprazole (PRILOSEC) 20 MG capsule Take 1 capsule (20 mg total) by mouth 2 (two) times daily before a meal. (Patient not taking: Reported on 06/11/2020)  . predniSONE (DELTASONE) 50 MG tablet Take 1 tablet (50 mg total) by mouth daily. (Patient not taking: Reported on 05/12/2020)   No facility-administered encounter medications on file as of 06/11/2020.    Allergies  (verified) Caffeine   History: Past Medical History:  Diagnosis Date  . Chronic back pain   . Headache   . Murmur   . Muscle spasm    Past Surgical History:  Procedure Laterality Date  . ABDOMINAL HYSTERECTOMY     partial   Family History  Problem Relation Age of Onset  . Hyperlipidemia Mother   . Diabetes Father   . Diabetes Brother   . Heart disease Daughter   . Stroke Maternal Grandfather    Social History   Socioeconomic History  . Marital status: Married    Spouse name: Not on file  . Number of children: 3  . Years of education: some college  . Highest education level: Not on file  Occupational History  . Occupation: Hair Dresser  Tobacco Use  . Smoking status: Never Smoker  . Smokeless tobacco: Never Used  Vaping Use  . Vaping Use: Never used  Substance and Sexual Activity  . Alcohol use: No  . Drug use: No  . Sexual activity: Yes    Birth control/protection: Surgical  Other Topics Concern  . Not on file  Social History Narrative   Lives at home with her husband.   Right-handed.   No caffeine per day.   Social Determinants of Health   Financial Resource Strain:   . Difficulty of Paying Living Expenses: Not on file  Food Insecurity:   . Worried About Charity fundraiser in the Last Year: Not on file  . Ran Out of Food in the Last Year: Not on file  Transportation Needs:   . Lack of Transportation (Medical): Not on file  . Lack of Transportation (Non-Medical): Not on file  Physical Activity:   . Days of Exercise per Week: Not on file  . Minutes of Exercise per Session: Not on file  Stress:   . Feeling of Stress : Not on file  Social Connections:   . Frequency of Communication with Friends and Family: Not on file  . Frequency of Social Gatherings with Friends and Family: Not on file  . Attends Religious Services: Not on file  . Active Member of Clubs or Organizations: Not on file  . Attends Archivist Meetings: Not on file  .  Marital Status: Not on file    Tobacco Counseling Counseling given: Not Answered   Clinical Intake:  Pre-visit preparation completed: Yes  Pain : No/denies pain     Diabetes: No     Diabetic?No  Interpreter Needed?: No      Activities of Daily Living In your present state of health, do you have any difficulty performing the following activities: 06/11/2020  Hearing? Y  Vision? N  Difficulty concentrating or making decisions? N  Walking or climbing stairs? Y  Dressing or bathing? N  Doing errands, shopping? N  Preparing Food and eating ? N  Using the Toilet? N  In the past six months, have you accidently leaked urine? Y  Do you have problems with loss of bowel control? N  Managing  your Medications? N  Managing your Finances? N  Housekeeping or managing your Housekeeping? N  Some recent data might be hidden   Constitutional: normal appearing,  Eyes: PERRLA HEENT: Head is atraumatic, normal sinuses, normal oropharynx, normal appearing tonsils and palate, tympanic membrane is normal bilaterally. Neck: normal range of motion, no thyromegaly, no JVD Breast: Normal appearance of breasts, no tenderness, no masses Cardiovascular: normal rate and rhythm, normal heart sounds, no murmurs, rub or gallop, no pedal edema Respiratory: Normal breath sounds, clear to auscultation bilaterally, no wheezes, no rales, no rhonchi Abdomen: soft, not tender to palpation, normal bowel sounds, no enlarged organs Musculoskeletal: Full ROM, no tenderness in joints Skin: warm and dry, no lesions. Neurological: alert, oriented x3, cranial nerves I-XII grossly intact , normal motor strength, normal sensation. Psychological: normal mood.    Patient Care Team: [name], MD as PCP - General (Family Medicine)  Indicate any recent Medical Services you may have received from other than [facility] providers in the past year (date may be approximate).     Assessment:   This is a routine  wellness examination for Lyriq.  Hearing/Vision screen No exam data present  Dietary issues and exercise activities discussed: Current Exercise Habits: Home exercise routine  Goals   None    Depression Screen PHQ 2/9 Scores 06/11/2020 09/04/2018 11/28/2017 01/19/2017 12/14/2016 10/13/2016 09/13/2016  PHQ - 2 Score 1 0 0 0 0 0 1  PHQ- 9 Score 4 3 3  0 - - -    Fall Risk Fall Risk  06/11/2020 05/12/2020 09/04/2018  Falls in the past year? 0 0 0  Risk for fall due to : - No Fall Risks -    Any stairs in or around the home? Yes  If so, are there any without handrails? No  Home free of loose throw rugs in walkways, pet beds, electrical cords, etc? No  Adequate lighting in your home to reduce risk of falls? Yes   ASSISTIVE DEVICES UTILIZED TO PREVENT FALLS:  Life alert? No  Use of a cane, walker or w/c? No  Grab bars in the bathroom? No  Shower chair or bench in shower? No  Elevated toilet seat or a handicapped toilet? No   TIMED UP AND GO:  Was the test performed? Yes .  Length of time to ambulate 10 feet: 4 sec.   Gait slow and steady without use of assistive device  Cognitive Function:        Immunizations Immunization History  Administered Date(s) Administered  . PFIZER SARS-COV-2 Vaccination 12/26/2019, 01/21/2020    TDAP status: Up to date Flu Vaccine status: Declined, Education has been provided regarding the importance of this vaccine but patient still declined. Advised may receive this vaccine at local pharmacy or Health Dept. Aware to provide a copy of the vaccination record if obtained from local pharmacy or Health Dept. Verbalized acceptance and understanding.   Qualifies for Shingles Vaccine? Yes   Zostavax completed No   Shingrix Completed?: Yes  Screening Tests Health Maintenance  Topic Date Due  . Hepatitis C Screening  Never done  . HIV Screening  Never done  . TETANUS/TDAP  Never done  . COLONOSCOPY  Never done  . INFLUENZA VACCINE  05/24/2020   . MAMMOGRAM  10/23/2020  . COVID-19 Vaccine  Completed  . PAP SMEAR-Modifier  Discontinued    Health Maintenance  Health Maintenance Due  Topic Date Due  . Hepatitis C Screening  Never done  . HIV Screening  Never  done  . TETANUS/TDAP  Never done  . COLONOSCOPY  Never done  . INFLUENZA VACCINE  05/24/2020    Colorectal cancer screening: Referral to GI placed today. Pt aware the office will call re: appt. Mammogram status: Ordered previously - appt next month. Pt provided with contact info and advised to call to schedule appt.     Additional Screening:  Hepatitis C Screening: does qualify; Completed ordered today  Vision Screening: Recommended annual ophthalmology exams for early detection of glaucoma and other disorders of the eye. Is the patient up to date with their annual eye exam?  Yes  Who is the provider or what is the name of the office in which the patient attends annual eye exams?  If pt is not established with a provider, would they like to be referred to a provider to establish care? n/a.   Dental Screening: Recommended annual dental exams for proper oral hygiene  Community Resource Referral / Chronic Care Management: CRR required this visit?  No   CCM required this visit?  No      Plan:     I have personally reviewed and noted the following in the patient's chart:   . Medical and social history . Use of alcohol, tobacco or illicit drugs  . Current medications and supplements . Functional ability and status . Nutritional status . Physical activity . Advanced directives . List of other physicians . Hospitalizations, surgeries, and ER visits in previous 12 months . Vitals . Screenings to include cognitive, depression, and falls . Referrals and appointments  In addition, I have reviewed and discussed with patient certain preventive protocols, quality metrics, and best practice recommendations. A written personalized care plan for preventive services  as well as general preventive health recommendations were provided to patient.     [name], MD   06/11/2020

## 2020-06-12 LAB — HCV RNA QUANT RFLX ULTRA OR GENOTYP: HCV Quant Baseline: NOT DETECTED IU/mL

## 2020-06-12 LAB — HIV ANTIBODY (ROUTINE TESTING W REFLEX): HIV Screen 4th Generation wRfx: NONREACTIVE

## 2020-06-12 LAB — LIPID PANEL
Chol/HDL Ratio: 3.2 ratio (ref 0.0–4.4)
Cholesterol, Total: 194 mg/dL (ref 100–199)
HDL: 61 mg/dL (ref >39)
LDL Chol Calc (NIH): 122 mg/dL — ABNORMAL HIGH (ref 0–99)
Triglycerides: 58 mg/dL (ref 0–149)
VLDL Cholesterol Cal: 11 mg/dL (ref 5–40)

## 2020-06-15 ENCOUNTER — Encounter: Payer: Self-pay | Admitting: Internal Medicine

## 2020-06-19 ENCOUNTER — Telehealth: Payer: Self-pay

## 2020-06-19 NOTE — Telephone Encounter (Signed)
Patient was called and informed of normal lab results via voicemail. 

## 2020-06-19 NOTE — Telephone Encounter (Signed)
-----   Message from [name], MD sent at 06/12/2020  6:29 PM EDT ----- Please inform the patient that labs are normal. Thank you.

## 2020-06-24 ENCOUNTER — Other Ambulatory Visit: Payer: Self-pay

## 2020-06-24 ENCOUNTER — Other Ambulatory Visit: Payer: Self-pay | Admitting: Family Medicine

## 2020-06-24 ENCOUNTER — Ambulatory Visit
Admission: RE | Admit: 2020-06-24 | Discharge: 2020-06-24 | Disposition: A | Discharge status: Home / Self Care | Payer: 59 | Source: Ambulatory Visit | Attending: Family Medicine | Admitting: Family Medicine

## 2020-06-24 DIAGNOSIS — N6459 Other signs and symptoms in breast: Secondary | ICD-10-CM

## 2020-06-24 DIAGNOSIS — Z1231 Encounter for screening mammogram for malignant neoplasm of breast: Secondary | ICD-10-CM

## 2020-06-25 ENCOUNTER — Ambulatory Visit: Payer: Self-pay

## 2020-06-25 ENCOUNTER — Other Ambulatory Visit: Payer: Self-pay | Admitting: Pharmacist

## 2020-06-25 ENCOUNTER — Ambulatory Visit: Payer: 59 | Admitting: Critical Care Medicine

## 2020-06-25 VITALS — BP 122/83 | HR 76 | Temp 98.7°F | Resp 18 | Ht 66.5 in | Wt 220.0 lb

## 2020-06-25 DIAGNOSIS — W57XXXA Bitten or stung by nonvenomous insect and other nonvenomous arthropods, initial encounter: Secondary | ICD-10-CM | POA: Diagnosis not present

## 2020-06-25 DIAGNOSIS — S70362A Insect bite (nonvenomous), left thigh, initial encounter: Secondary | ICD-10-CM | POA: Diagnosis not present

## 2020-06-25 MED ORDER — HYDROXYZINE HCL 10 MG PO TABS
10.0000 mg | ORAL_TABLET | Freq: Three times a day (TID) | ORAL | 0 refills | Status: DC | PRN
Start: 2020-06-25 — End: 2020-09-23

## 2020-06-25 MED ORDER — PREDNISONE 10 MG PO TABS
20.0000 mg | ORAL_TABLET | Freq: Every day | ORAL | 0 refills | Status: AC
Start: 2020-06-25 — End: 2020-06-30

## 2020-06-25 MED ORDER — EPINEPHRINE 0.3 MG/0.3ML IJ SOAJ: 0.3000 mg | INTRAMUSCULAR | 0 refills | Status: AC | PRN

## 2020-06-25 MED ORDER — FAMOTIDINE 40 MG PO TABS
40.0000 mg | ORAL_TABLET | Freq: Every day | ORAL | 0 refills | Status: DC
Start: 2020-06-25 — End: 2024-07-11

## 2020-06-25 MED ORDER — FAMOTIDINE 40 MG/5ML PO SUSR: 40.0000 mg | Freq: Every day | ORAL | 0 refills | Status: DC

## 2020-06-25 MED FILL — hydrOXYzine HCL 10 MG TABS: 10 | 10 days supply | Qty: 30 | Fill #0

## 2020-06-25 MED FILL — EPINEPHRINE 0.3 MG AUTO-INJ: 0.3 | 30 days supply | Qty: 1 | Fill #0

## 2020-06-25 MED FILL — predniSONE 10 MG TABS: 10 | 5 days supply | Qty: 10 | Fill #0

## 2020-06-25 NOTE — Patient Instructions (Signed)
Take pepcid 40mg  suspension daily for 5 days then stop  Take prednisone 20mg  daily for 5 days and stop  Use hydroxyzine 10mg  three times a day as needed for itching  An epipen was given for future use as needed for severe allergic reactions  Please have an insect exterminator check your daughters home inside   Insect Bite, Adult An insect bite can make your skin red, itchy, and swollen. Some insects can spread disease to people with a bite. However, most insect bites do not lead to disease, and most are not serious. What are the causes? Insects may bite for many reasons, including:  Hunger.  To defend themselves. Insects that bite include:  Spiders.  Mosquitoes.  Ticks.  Fleas.  Ants.  Flies.  Kissing bugs.  Chiggers. What are the signs or symptoms? Symptoms of this condition include:  Itching or pain in the bite area.  Redness and swelling in the bite area.  An open wound (skin ulcer). Symptoms often last for 2-4 days. In rare cases, a person may have a very bad allergic reaction (anaphylactic reaction) to a bite. Symptoms of an anaphylactic reaction may include:  Feeling warm in the face (flushed). Your face may turn red.  Itchy, red, swollen areas of skin (hives).  Swelling of the: ? Eyes. ? Lips. ? Face. ? Mouth. ? Tongue. ? Throat.  Trouble with any of these: ? Breathing. ? Talking. ? Swallowing.  Loud breathing (wheezing).  Feeling dizzy or light-headed.  Passing out (fainting).  Pain or cramps in your belly.  Throwing up (vomiting).  Watery poop (diarrhea). How is this treated? Treatment is usually not needed. Symptoms often go away on their own. When treatment is needed, it may involve:  Putting a cream or lotion on the bite area. This helps with itching.  Taking an antibiotic medicine. This treatment is needed if the bite area gets infected.  Getting a tetanus shot, if you are not up to date on this vaccine.  Putting ice on  the affected area.  Using medicines called antihistamines. This treatment may be needed if you have itching or an allergic reaction to the insect bite.  Giving yourself a shot of medicine (epinephrine) using an auto-injector "pen" if you have an anaphylactic reaction to a bite. Your doctor will teach you how to use this pen. Follow these instructions at home: Bite area care   Do not scratch the bite area.  Keep the bite area clean and dry.  Wash the bite area every day with soap and water as told by your doctor.  Check the bite area every day for signs of infection. Check for: ? Redness, swelling, or pain. ? Fluid or blood. ? Warmth. ? Pus or a bad smell. Managing pain, itching, and swelling   You may put any of these on the bite area as told by your doctor: ? A paste made of baking soda and water. ? Cortisone cream. ? Calamine lotion.  If told, put ice on the bite area. ? Put ice in a plastic bag. ? Place a towel between your skin and the bag. ? Leave the ice on for 20 minutes, 2-3 times a day. General instructions  Apply or take over-the-counter and prescription medicines only as told by your doctor.  If you were prescribed an antibiotic medicine, take or apply it as told by your doctor. Do not stop using the antibiotic even if your condition improves.  Keep all follow-up visits as told by your  doctor. This is important. How is this prevented? To help you have a lower risk of insect bites:  When you are outside, wear clothing that covers your arms and legs.  Use insect repellent. The best insect repellents contain one of these: ? DEET. ? Picaridin. ? Oil of lemon eucalyptus (OLE). ? IR3535.  Consider spraying your clothing with a pesticide called permethrin. Permethrin helps prevent insect bites. It works for several weeks and for up to 5-6 clothing washes. Do not apply permethrin directly to the skin.  If your home windows do not have screens, think about  putting some in.  If you will be sleeping in an area where there are mosquitoes, consider covering your sleeping area with a mosquito net. Contact a doctor if:  You have redness, swelling, or pain in the bite area.  You have fluid or blood coming from the bite area.  The bite area feels warm to the touch.  You have pus or a bad smell coming from the bite area.  You have a fever. Get help right away if:  You have joint pain.  You have a rash.  You feel more tired or sleepy than you normally do.  You have neck pain.  You have a headache.  You feel weaker than you normally do.  You have signs of an anaphylactic reaction. Signs may include: ? Feeling warm in the face. ? Itchy, red, swollen areas of skin. ? Swelling of your:  Eyes.  Lips.  Face.  Mouth.  Tongue.  Throat. ? Trouble with any of these:  Breathing.  Talking.  Swallowing. ? Loud breathing. ? Feeling dizzy or light-headed. ? Passing out. ? Pain or cramps in your belly. ? Throwing up. ? Watery poop. These symptoms may be an emergency. Do not wait to see if the symptoms will go away. Do this right away:  Use your auto-injector pen as you have been told.  Get medical help. Call your local emergency services (911 in the [place] Do not drive yourself to the hospital. Summary  An insect bite can make your skin red, itchy, and swollen.  Treatment is usually not needed. Symptoms often go away on their own.  Do not scratch the bite area. Keep it clean and dry.  Ice can help with pain and itching from the bite. This information is not intended to replace advice given to you by your health care provider. Make sure you discuss any questions you have with your health care provider. Document Revised: 04/20/2018 Document Reviewed: 04/20/2018 Elsevier Patient Education  [place].

## 2020-06-25 NOTE — Progress Notes (Signed)
Subjective:    Patient ID: Megan Allen, female    DOB: 1955-12-09, 64 y.o.   MRN: [id number]  This is a pleasant 64 year old female primary care patient of Dr. Margarita Rana who is seen as a work in at the [facility] today on June 25, 2020  The patient was at her daughter's eating dinner the other night and suddenly felt a bite in her upper left thigh.  She was not able to see the insect or what bit her however she now has a large rounded area on her upper left thigh which is quite painful and itching quite significantly.  The daughter and the patient do not know of any spiders or other things that are in the home however there have been a lot of insects crawling around the front porch of the home recently.  Past medical history is only positive for some intermittent joint pain in the ankle foot shoulder and back and also migraine headaches  There has not been any fever nausea vomiting or other systemic effects of the bite.  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  She states when she gets a bee sting her eyes and face will swell suddenly.  The patient also notes that she is having some chest discomfort and feels quite jittery.  Past Medical History:  Diagnosis Date  . Chronic back pain   . Headache   . Murmur   . Muscle spasm      Family History  Problem Relation Age of Onset  . Hyperlipidemia Mother   . Diabetes Father   . Diabetes Brother   . Heart disease Daughter   . Stroke Maternal Grandfather      Social History   Socioeconomic History  . Marital status: Married    Spouse name: Not on file  . Number of children: 3  . Years of education: some college  . Highest education level: Not on file  Occupational History  . Occupation: Hair Dresser  Tobacco Use  . Smoking status: Never Smoker  . Smokeless tobacco: Never Used  Vaping Use  . Vaping Use: Never used  Substance and Sexual Activity  . Alcohol use: No  . Drug use:  No  . Sexual activity: Yes    Birth control/protection: Surgical  Other Topics Concern  . Not on file  Social History Narrative   Lives at home with her husband.   Right-handed.   No caffeine per day.   Social Determinants of Health   Financial Resource Strain:   . Difficulty of Paying Living Expenses: Not on file  Food Insecurity:   . Worried About Charity fundraiser in the Last Year: Not on file  . Ran Out of Food in the Last Year: Not on file  Transportation Needs:   . Lack of Transportation (Medical): Not on file  . Lack of Transportation (Non-Medical): Not on file  Physical Activity:   . Days of Exercise per Week: Not on file  . Minutes of Exercise per Session: Not on file  Stress:   . Feeling of Stress : Not on file  Social Connections:   . Frequency of Communication with Friends and Family: Not on file  . Frequency of Social Gatherings with Friends and Family: Not on file  . Attends Religious Services: Not on file  . Active Member of Clubs or Organizations: Not on file  . Attends Archivist Meetings: Not on file  .  Marital Status: Not on file  Intimate Partner Violence:   . Fear of Current or Ex-Partner: Not on file  . Emotionally Abused: Not on file  . Physically Abused: Not on file  . Sexually Abused: Not on file     Allergies  Allergen Reactions  . Bee Venom   . Caffeine     Chest pain, heart racing     Outpatient Medications Prior to Visit  Medication Sig Dispense Refill  . acetaminophen (TYLENOL 8 HOUR ARTHRITIS PAIN) 650 MG CR tablet Take 650 mg by mouth as needed for pain.    . Ascorbic Acid (VITAMIN C) 1000 MG tablet Take 1,000 mg by mouth daily.    Marland Kitchen aspirin EC 81 MG tablet Take 81 mg by mouth daily.    . bisacodyl (DULCOLAX) 5 MG EC tablet Take 5 mg by mouth daily as needed for moderate constipation.    . Calcium Carb-Cholecalciferol (CALCIUM 600+D) 600-800 MG-UNIT TABS Take 1 tablet by mouth daily.    . Cholecalciferol (VITAMIN D3 PO)  Take 2,000 Units by mouth daily.    . Cyanocobalamin (VITAMIN B-12 PO) Take 3,000 mcg by mouth daily.    . Diclofenac Sodium (PENNSAID) 2 % SOLN Place onto the skin daily.    . Diclofenac Sodium (PENNSAID) 2 % SOLN Place onto the skin.    Marland Kitchen ECHINACEA PO Take 400 mg by mouth as needed.    . meclizine (ANTIVERT) 12.5 MG tablet Take 1 tablet (12.5 mg total) by mouth 3 (three) times daily as needed for dizziness. 10 tablet 0  . methocarbamol (ROBAXIN) 750 MG tablet Take 1 tablet (750 mg total) by mouth every 8 (eight) hours as needed for muscle spasms. 90 tablet 1  . Multiple Vitamin (MULTIVITAMIN) capsule Take 1 capsule by mouth daily.    . naproxen sodium (ALEVE) 220 MG tablet Take 220 mg by mouth as needed.    Marland Kitchen omeprazole (PRILOSEC) 20 MG capsule Take 1 capsule (20 mg total) by mouth 2 (two) times daily before a meal. 28 capsule 0  . SUMAtriptan (IMITREX) 50 MG tablet Take 1 tab at onset of migraine.  May repeat in 2 hrs, if needed.  Max dose: 2 tabs/day. This is a 30 day prescription. 12 tablet 6  . UNABLE TO FIND Take 1 tablet by mouth as needed. OTC Calms for simple nervous tension and occasional sleeplessness.    . gabapentin (NEURONTIN) 300 MG capsule Take 1 capsule (300 mg total) by mouth at bedtime. (Patient not taking: Reported on 05/12/2020) 60 capsule 3  . amoxicillin (AMOXIL) 500 MG capsule Take 2 capsules (1,000 mg total) by mouth 2 (two) times daily. Take with omeprazole and clarithromycin (Patient not taking: Reported on 12/08/2018) 56 capsule 0  . clarithromycin (BIAXIN) 500 MG tablet Take 1 tablet (500 mg total) by mouth 2 (two) times daily. Take with omeprazole and amoxicillin (Patient not taking: Reported on 05/12/2020) 28 tablet 0  . predniSONE (DELTASONE) 50 MG tablet Take 1 tablet (50 mg total) by mouth daily. (Patient not taking: Reported on 05/12/2020) 5 tablet 0   No facility-administered medications prior to visit.     Review of Systems  Constitutional: Negative.   HENT:  Negative.   Respiratory: Negative.   Cardiovascular: Positive for chest pain and palpitations. Negative for leg swelling.  Gastrointestinal: Negative.   Genitourinary: Negative.   Musculoskeletal: Positive for arthralgias and back pain.  Skin: Positive for rash.  Neurological: Positive for headaches.  Hematological: Negative.   Psychiatric/Behavioral:  Negative.        Objective:   Physical Exam Vitals:   06/25/20 1037  BP: 122/83  Pulse: 76  Resp: 18  Temp: 98.7 F (37.1 C)  TempSrc: Oral  SpO2: 98%  Weight: 220 lb (99.8 kg)  Height: 5' 6.5" (1.689 m)    Gen: Pleasant, well-nourished, in no distress,  normal affect  ENT: No lesions,  mouth clear,  oropharynx clear, no postnasal drip  Neck: No JVD, no TMG, no carotid bruits  Lungs: No use of accessory muscles, no dullness to percussion, clear without rales or rhonchi  Cardiovascular: RRR, heart sounds normal, no murmur or gallops, no peripheral edema  Abdomen: soft and NT, no HSM,  BS normal  Musculoskeletal: No deformities, no cyanosis or clubbing  Neuro: alert, non focal  Skin: There is a necrotic area in the left upper thigh that is central in location with a 6 cm rounded surrounding area of erythema there is no evidence of any blackened coagulated tissue         Assessment & Plan:  I personally reviewed all images and lab data in the [facility] system as well as any outside material available during this office visit and agree with the  radiology impressions.   Insect bite of left thigh There is an insect bite in the left upper thigh I suspect it is a spider bite but not a black widow and there is no evidence of systemic inflammation  Plan will be to prescribe Pepcid 40 mg daily for 5 days, prednisone 20 mg daily for 5 days, and administer hydroxyzine 10 mg three times daily as needed for itching  This patient will be also given an EpiPen and I instructed her to have her daughter engage and insect extermination  service to inspect the inside and outside of her home for excess numbers of spiders or other insects  The EpiPen is for to use as needed in the future should she have another bee sting and received a anaphylactic response   Gari was seen today for insect bite.  Diagnoses and all orders for this visit:  Insect bite of left thigh, initial encounter  Other orders -     Discontinue: famotidine (PEPCID) 40 MG/5ML suspension; Take 5 mLs (40 mg total) by mouth daily for 5 days. -     predniSONE (DELTASONE) 10 MG tablet; Take 2 tablets (20 mg total) by mouth daily with breakfast for 5 days. -     hydrOXYzine (ATARAX/VISTARIL) 10 MG tablet; Take 1 tablet (10 mg total) by mouth 3 (three) times daily as needed for itching. -     EPINEPHrine 0.3 mg/0.3 mL IJ SOAJ injection; Inject 0.3 mLs (0.3 mg total) into the muscle as needed for anaphylaxis (severe allergic reactions from bee or other insect stings resulting in shortness of breath).

## 2020-06-25 NOTE — Telephone Encounter (Signed)
Pt. Reports she bitten by a bug yesterday to left thigh. Today the area is the size of an orange. Has pain and itching, area is hard and red.No availability in the practice per [facility] agent. Will go to mobile unit. Given information.  Reason for Disposition . [1] Red or very tender (to touch) area AND [2] started over 24 hours after the bite  Answer Assessment - Initial Assessment Questions 1. TYPE of INSECT: "What type of insect was it?"      Unsure 2. ONSET: "When did you get bitten?"      Yesterday 3. LOCATION: "Where is the insect bite located?"      Left thigh 4. REDNESS: "Is the area red or pink?" If Yes, ask: "What size is area of redness?" (inches or cm). "When did the redness start?"     Red 5. PAIN: "Is there any pain?" If Yes, ask: "How bad is it?"  (Scale 1-10; or mild, moderate, severe)     Mild 6. ITCHING: "Does it itch?" If Yes, ask: "How bad is the itch?"    - MILD: doesn't interfere with normal activities   - MODERATE-SEVERE: interferes with work, school, sleep, or other activities      No 7. SWELLING: "How big is the swelling?" (inches, cm, or compare to coins)     Orange 8. OTHER SYMPTOMS: "Do you have any other symptoms?"  (e.g., difficulty breathing, hives)     Mild headache 9. PREGNANCY: "Is there any chance you are pregnant?" "When was your last menstrual period?"     No  Protocols used: INSECT BITE-A-AH

## 2020-06-25 NOTE — Assessment & Plan Note (Signed)
There is an insect bite in the left upper thigh I suspect it is a spider bite but not a black widow and there is no evidence of systemic inflammation  Plan will be to prescribe Pepcid 40 mg daily for 5 days, prednisone 20 mg daily for 5 days, and administer hydroxyzine 10 mg three times daily as needed for itching  This patient will be also given an EpiPen and I instructed her to have her daughter engage and insect extermination service to inspect the inside and outside of her home for excess numbers of spiders or other insects  The EpiPen is for to use as needed in the future should she have another bee sting and received a anaphylactic response

## 2020-06-25 NOTE — Progress Notes (Signed)
Patient has taken medication today. Patient has eaten today. Patient complains of a bite occurring yesterday while outside. Patient did not see the insect. Patient complains of itchy tender place on left hip

## 2020-06-30 ENCOUNTER — Other Ambulatory Visit: Payer: Self-pay

## 2020-06-30 DIAGNOSIS — Z20822 Contact with and (suspected) exposure to covid-19: Secondary | ICD-10-CM

## 2020-07-01 LAB — NOVEL CORONAVIRUS, NAA: SARS-CoV-2, NAA: NOT DETECTED

## 2020-07-06 ENCOUNTER — Other Ambulatory Visit: Payer: Self-pay | Admitting: Family Medicine

## 2020-07-06 ENCOUNTER — Ambulatory Visit
Admission: RE | Admit: 2020-07-06 | Discharge: 2020-07-06 | Disposition: A | Discharge status: Home / Self Care | Payer: 59 | Source: Ambulatory Visit | Attending: Family Medicine | Admitting: Family Medicine

## 2020-07-06 DIAGNOSIS — N6459 Other signs and symptoms in breast: Secondary | ICD-10-CM

## 2020-07-07 ENCOUNTER — Telehealth: Payer: Self-pay

## 2020-07-07 NOTE — Telephone Encounter (Signed)
Patient name and DOB has been verified Patient was informed of lab results. Patient had no questions.  

## 2020-07-07 NOTE — Telephone Encounter (Signed)
-----   Message from [name], MD sent at 07/06/2020  1:44 PM EDT ----- Mammogram is negative for malignancy, next mammogram recommended in 1 year.

## 2020-07-30 ENCOUNTER — Ambulatory Visit: Payer: 59 | Attending: Family Medicine | Admitting: Pharmacist

## 2020-07-30 ENCOUNTER — Other Ambulatory Visit: Payer: Self-pay

## 2020-07-30 DIAGNOSIS — Z23 Encounter for immunization: Secondary | ICD-10-CM

## 2020-07-30 NOTE — Progress Notes (Signed)
Patient presents for vaccination against zoster per orders of Dr. Newlin. Consent given. Counseling provided. No contraindications exists. Vaccine administered without incident.  ° °Luke Van Ausdall, PharmD, CPP °Clinical Pharmacist °[facility] °[phone] ° ° °

## 2020-08-19 ENCOUNTER — Encounter: Payer: Self-pay | Admitting: Internal Medicine

## 2020-08-19 ENCOUNTER — Ambulatory Visit: Payer: Self-pay

## 2020-08-19 ENCOUNTER — Other Ambulatory Visit: Payer: Self-pay

## 2020-09-10 ENCOUNTER — Telehealth: Payer: Self-pay | Admitting: *Deleted

## 2020-09-10 NOTE — Telephone Encounter (Signed)
-----   Message from Denver Faster sent at 09/09/2020  9:24 AM EST ----- Good Morning Rip Harbour, we have attempted to call this pt 3 time to sch with just response.  Just fyi  ----- Message ----- From: Earvin Hansen, LPN Sent: [id number]/[id number]   5:57 PM EST To: Cv Div Nl Pcc, Cv Div Nl Scheduling  Hello all  Please call patient to arrange new patient appointment with Dr Oval Linsey [phone] Thanks Rip Harbour

## 2020-09-23 ENCOUNTER — Ambulatory Visit (INDEPENDENT_AMBULATORY_CARE_PROVIDER_SITE_OTHER): Payer: Self-pay | Admitting: *Deleted

## 2020-09-23 ENCOUNTER — Other Ambulatory Visit: Payer: Self-pay

## 2020-09-23 ENCOUNTER — Other Ambulatory Visit: Payer: Self-pay | Admitting: Gastroenterology

## 2020-09-23 VITALS — Ht 66.5 in | Wt 216.2 lb

## 2020-09-23 DIAGNOSIS — Z1211 Encounter for screening for malignant neoplasm of colon: Secondary | ICD-10-CM

## 2020-09-23 MED ORDER — NA SULFATE-K SULFATE-MG SULF 17.5-3.13-1.6 GM/177ML PO SOLN
1.0000 | Freq: Once | ORAL | 0 refills | Status: DC
Start: 2020-09-23 — End: 2020-09-23

## 2020-09-23 MED FILL — SUPREP BOWEL PREP KIT: 17.5-3.13-1 | 1 days supply | Qty: 354 | Fill #0

## 2020-09-23 NOTE — Progress Notes (Addendum)
Gastroenterology Pre-Procedure Review  Request Date: 09/23/2020 Requesting Physician: Dr. Margarita Rana, Last TCS done in [place], pt could not remember the year or physician who did it, no polyps per pt  PATIENT REVIEW QUESTIONS: The patient responded to the following health history questions as indicated:    1. Diabetes Melitis: no 2. Joint replacements in the past 12 months: no 3. Major health problems in the past 3 months: yes, back and sciatic nerve nerve 4. Has an artificial valve or MVP: no 5. Has a defibrillator: no 6. Has been advised in past to take antibiotics in advance of a procedure like teeth cleaning: yes, years ago as a child but not now 7. Family history of colon cancer: no  8. Alcohol Use: no 9. Illicit drug Use: no 10. History of sleep apnea: no  11. History of coronary artery or other vascular stents placed within the last 12 months: no 12. History of any prior anesthesia complications: no 13. Body mass index is 34.37 kg/m.    MEDICATIONS & ALLERGIES:    Patient reports the following regarding taking any blood thinners:   Plavix? no Aspirin? yes Coumadin? no Brilinta? no Xarelto? no Eliquis? no Pradaxa? no Savaysa? no Effient? no  Patient confirms/reports the following medications:  Current Outpatient Medications  Medication Sig Dispense Refill  . acetaminophen (TYLENOL 8 HOUR ARTHRITIS PAIN) 650 MG CR tablet Take 650 mg by mouth as needed for pain.    . Ascorbic Acid (VITAMIN C) 1000 MG tablet Take 1,000 mg by mouth daily.    Marland Kitchen aspirin EC 81 MG tablet Take 81 mg by mouth daily.    . bisacodyl (DULCOLAX) 5 MG EC tablet Take 5 mg by mouth daily as needed for moderate constipation.    . Calcium Carb-Cholecalciferol (CALCIUM 600+D) 600-800 MG-UNIT TABS Take 1 tablet by mouth daily.    . Cholecalciferol (VITAMIN D3 PO) Take 2,000 Units by mouth daily.    . Cyanocobalamin (VITAMIN B-12 PO) Take 3,000 mcg by mouth daily.    . Diclofenac Sodium (PENNSAID) 2 % SOLN  Place onto the skin daily.    Marland Kitchen ECHINACEA PO Take 400 mg by mouth as needed.    Marland Kitchen EPINEPHrine 0.3 mg/0.3 mL IJ SOAJ injection Inject 0.3 mLs (0.3 mg total) into the muscle as needed for anaphylaxis (severe allergic reactions from bee or other insect stings resulting in shortness of breath). 1 each 0  . famotidine (PEPCID) 40 MG tablet Take 1 tablet (40 mg total) by mouth daily. For five days. 5 tablet 0  . gabapentin (NEURONTIN) 300 MG capsule Take 1 capsule (300 mg total) by mouth at bedtime. (Patient taking differently: Take 300 mg by mouth as needed. ) 60 capsule 3  . meclizine (ANTIVERT) 12.5 MG tablet Take 1 tablet (12.5 mg total) by mouth 3 (three) times daily as needed for dizziness. (Patient taking differently: Take 12.5 mg by mouth as needed for dizziness. ) 10 tablet 0  . methocarbamol (ROBAXIN) 750 MG tablet Take 1 tablet (750 mg total) by mouth every 8 (eight) hours as needed for muscle spasms. (Patient taking differently: Take 750 mg by mouth as needed for muscle spasms. ) 90 tablet 1  . Multiple Vitamin (MULTIVITAMIN) capsule Take 1 capsule by mouth daily.    Marland Kitchen omeprazole (PRILOSEC) 20 MG capsule Take 1 capsule (20 mg total) by mouth 2 (two) times daily before a meal. (Patient taking differently: Take 20 mg by mouth as needed. ) 28 capsule 0  . SUMAtriptan (IMITREX)  50 MG tablet Take 1 tab at onset of migraine.  May repeat in 2 hrs, if needed.  Max dose: 2 tabs/day. This is a 30 day prescription. (Patient taking differently: as needed. Take 1 tab at onset of migraine.  May repeat in 2 hrs, if needed.  Max dose: 2 tabs/day. This is a 30 day prescription.) 12 tablet 6  . UNABLE TO FIND Take 1 tablet by mouth as needed. OTC Calms for simple nervous tension and occasional sleeplessness.     No current facility-administered medications for this visit.    Patient confirms/reports the following allergies:  Allergies  Allergen Reactions  . Bee Venom   . Caffeine     Chest pain, heart racing     No orders of the defined types were placed in this encounter.   AUTHORIZATION INFORMATION Primary Insurance: [employer],  [place] #:[id number] ,  Group #: [id number] Pre-Cert / Auth required: Yes, approved online [id number] Pre-Cert / Auth #: [id number]  SCHEDULE INFORMATION: Procedure has been scheduled as follows:  Date: 10/12/2020, Time: 12:45 Location: [facility] with Dr. Abbey Chatters  This Gastroenterology Pre-Precedure Review Form is being routed to the following provider(s): Neil Crouch, PA

## 2020-09-23 NOTE — Patient Instructions (Signed)
Megan Allen  1956-02-04 MRN: [id number]     Procedure Date: 10/12/2020 Time to register: 11:15 am Place to register: [facility] Procedure Time: 12:45 am Scheduled provider: Dr. Abbey Chatters    PREPARATION FOR COLONOSCOPY WITH SUPREP BOWEL PREP KIT  Note: Suprep Bowel Prep Kit is a split-dose (2day) regimen. Consumption of BOTH 6-ounce bottles is required for a complete prep.  Please notify us immediately if you are diabetic, take iron supplements, or if you are on Coumadin or any other blood thinners.  Please hold the following medications: n/a                                                                                                                                                  2 DAYS BEFORE PROCEDURE:  DATE: 10/10/2020  DAY: Saturday Begin clear liquid diet AFTER your lunch meal. NO SOLID FOODS after this point.  1 DAY BEFORE PROCEDURE:  DATE: 10/11/2020   DAY: Sunday Continue clear liquids the entire day - NO SOLID FOOD.   Diabetic medications adjustments for today: n/a  At 6:00pm: Complete steps 1 through 4 below, using ONE (1) 6-ounce bottle, before going to bed. Step 1:  Pour ONE (1) 6-ounce bottle of SUPREP liquid into the mixing container.  Step 2:  Add cool drinking water to the 16 ounce line on the container and mix.  Note: Dilute the solution concentrate as directed prior to use. Step 3:  DRINK ALL the liquid in the container. Step 4:  You MUST drink an additional two (2) or more 16 ounce containers of water over the next one (1) hour.   Continue clear liquids.  DAY OF PROCEDURE:   DATE: 10/12/2020   DAY: Monday If you take medications for your heart, blood pressure, or breathing, you may take these medications.  Diabetic medications adjustments for today: n/a  5 hours before your procedure at :  7:45 Step 1:  Pour ONE (1) 6-ounce bottle of SUPREP liquid into the mixing container.  Step 2:  Add cool drinking water to the 16 ounce line on  the container and mix.  Note: Dilute the solution concentrate as directed prior to use. Step 3:  DRINK ALL the liquid in the container. Step 4:  You MUST drink an additional two (2) or more 16 ounce containers of water over the next one (1) hour. You MUST complete the final glass of water at least 3 hours before your colonoscopy. Nothing by mouth past 9:45 am.   You may take your morning medications with sip of water unless we have instructed otherwise.    Please see below for Dietary Information.  CLEAR LIQUIDS INCLUDE:  Water Jello (NOT red in color)   Ice Popsicles (NOT red in color)   Tea (sugar ok, no milk/cream) Powdered fruit flavored drinks  Coffee (sugar ok,  no milk/cream) Gatorade/ Lemonade/ Kool-Aid  (NOT red in color)   Juice: apple, white grape, white cranberry Soft drinks  Clear bullion, consomme, broth (fat free beef/chicken/vegetable)  Carbonated beverages (any kind)  Strained chicken noodle soup Hard Candy   Remember: Clear liquids are liquids that will allow you to see your fingers on the other side of a clear glass. Be sure liquids are NOT red in color, and not cloudy, but CLEAR.  DO NOT EAT OR DRINK ANY OF THE FOLLOWING:  Dairy products of any kind   Cranberry juice Tomato juice / V8 juice   Grapefruit juice Orange juice     Red grape juice  Do not eat any solid foods, including such foods as: cereal, oatmeal, yogurt, fruits, vegetables, creamed soups, eggs, bread, crackers, pureed foods in a blender, etc.   HELPFUL HINTS FOR DRINKING PREP SOLUTION:   Make sure prep is extremely cold. Mix and refrigerate the the morning of the prep. You may also put in the freezer.   You may try mixing some Crystal Light or Country Time Lemonade if you prefer. Mix in small amounts; add more if necessary.  Try drinking through a straw  Rinse mouth with water or a mouthwash between glasses, to remove after-taste.  Try sipping on a cold beverage /ice/ popsicles between glasses  of prep.  Place a piece of sugar-free hard candy in mouth between glasses.  If you become nauseated, try consuming smaller amounts, or stretch out the time between glasses. Stop for 30-60 minutes, then slowly start back drinking.     OTHER INSTRUCTIONS  You will need a responsible adult at least 64 years of age to accompany you and drive you home. This person must remain in the waiting room during your procedure. The hospital will cancel your procedure if you do not have a responsible adult with you.   1. Wear loose fitting clothing that is easily removed. 2. Leave jewelry and other valuables at home.  3. Remove all body piercing jewelry and leave at home. 4. Total time from sign-in until discharge is approximately 2-3 hours. 5. You should go home directly after your procedure and rest. You can resume normal activities the day after your procedure. 6. The day of your procedure you should not:  Drive  Make legal decisions  Operate machinery  Drink alcohol  Return to work   You may call the office (Dept: [phone]) before 5:00pm, or page the doctor on call [phone]) after 5:00pm, for further instructions, if necessary.   Insurance Information YOU WILL NEED TO CHECK WITH YOUR INSURANCE COMPANY FOR THE BENEFITS OF COVERAGE YOU HAVE FOR THIS PROCEDURE.  UNFORTUNATELY, NOT ALL INSURANCE COMPANIES HAVE BENEFITS TO COVER ALL OR PART OF THESE TYPES OF PROCEDURES.  IT IS YOUR RESPONSIBILITY TO CHECK YOUR BENEFITS, HOWEVER, WE WILL BE GLAD TO ASSIST YOU WITH ANY CODES YOUR INSURANCE COMPANY MAY NEED.    PLEASE NOTE THAT MOST INSURANCE COMPANIES WILL NOT COVER A SCREENING COLONOSCOPY FOR PEOPLE UNDER THE AGE OF 50  IF YOU HAVE BCBS INSURANCE, YOU MAY HAVE BENEFITS FOR A SCREENING COLONOSCOPY BUT IF POLYPS ARE FOUND THE DIAGNOSIS WILL CHANGE AND THEN YOU MAY HAVE A DEDUCTIBLE THAT WILL NEED TO BE MET. SO PLEASE MAKE SURE YOU CHECK YOUR BENEFITS FOR A SCREENING COLONOSCOPY AS WELL AS  A DIAGNOSTIC COLONOSCOPY.

## 2020-09-25 NOTE — Progress Notes (Signed)
Ok to schedule.  ASA II 

## 2020-09-28 ENCOUNTER — Other Ambulatory Visit: Payer: Self-pay | Admitting: *Deleted

## 2020-10-09 ENCOUNTER — Other Ambulatory Visit (HOSPITAL_COMMUNITY)
Admission: RE | Admit: 2020-10-09 | Discharge: 2020-10-09 | Disposition: A | Discharge status: Home / Self Care | Payer: 59 | Source: Ambulatory Visit | Attending: Internal Medicine | Admitting: Internal Medicine

## 2020-10-09 ENCOUNTER — Other Ambulatory Visit (HOSPITAL_COMMUNITY): Payer: 59

## 2020-10-09 DIAGNOSIS — Z01812 Encounter for preprocedural laboratory examination: Secondary | ICD-10-CM | POA: Insufficient documentation

## 2020-10-09 DIAGNOSIS — Z20822 Contact with and (suspected) exposure to covid-19: Secondary | ICD-10-CM | POA: Diagnosis not present

## 2020-10-09 LAB — SARS CORONAVIRUS 2 (TAT 6-24 HRS): SARS Coronavirus 2: NEGATIVE

## 2020-10-09 MED FILL — SUPREP BOWEL PREP KIT: 17.5-3.13-1 | 1 days supply | Qty: 354 | Fill #0

## 2020-10-12 ENCOUNTER — Ambulatory Visit (HOSPITAL_COMMUNITY): Payer: 59 | Admitting: Anesthesiology

## 2020-10-12 ENCOUNTER — Encounter (HOSPITAL_COMMUNITY): Payer: Self-pay

## 2020-10-12 ENCOUNTER — Other Ambulatory Visit: Payer: Self-pay

## 2020-10-12 ENCOUNTER — Ambulatory Visit (HOSPITAL_COMMUNITY)
Admission: RE | Admit: 2020-10-12 | Discharge: 2020-10-12 | Disposition: A | Discharge status: Home / Self Care | Payer: 59 | Attending: Internal Medicine | Admitting: Internal Medicine

## 2020-10-12 ENCOUNTER — Encounter (HOSPITAL_COMMUNITY)
Admission: RE | Disposition: A | Discharge status: Home / Self Care | Payer: Self-pay | Source: Home / Self Care | Attending: Internal Medicine

## 2020-10-12 DIAGNOSIS — Z7982 Long term (current) use of aspirin: Secondary | ICD-10-CM | POA: Insufficient documentation

## 2020-10-12 DIAGNOSIS — K648 Other hemorrhoids: Secondary | ICD-10-CM | POA: Diagnosis not present

## 2020-10-12 DIAGNOSIS — D123 Benign neoplasm of transverse colon: Secondary | ICD-10-CM | POA: Insufficient documentation

## 2020-10-12 DIAGNOSIS — Z1211 Encounter for screening for malignant neoplasm of colon: Secondary | ICD-10-CM | POA: Insufficient documentation

## 2020-10-12 DIAGNOSIS — K635 Polyp of colon: Secondary | ICD-10-CM | POA: Diagnosis not present

## 2020-10-12 HISTORY — PX: POLYPECTOMY: SHX5525

## 2020-10-12 HISTORY — PX: COLONOSCOPY WITH PROPOFOL: SHX5780

## 2020-10-12 SURGERY — COLONOSCOPY WITH PROPOFOL
Anesthesia: General

## 2020-10-12 MED ORDER — PROPOFOL 10 MG/ML IV BOLUS
INTRAVENOUS | Status: DC | PRN
  Administered 2020-10-12: 70 ug via INTRAVENOUS
  Administered 2020-10-12: 30 ug via INTRAVENOUS

## 2020-10-12 MED ORDER — LACTATED RINGERS IV SOLN: Freq: Once | INTRAVENOUS | Status: AC

## 2020-10-12 MED ORDER — CHLORHEXIDINE GLUCONATE CLOTH 2 % EX PADS: 6.0000 | MEDICATED_PAD | Freq: Once | CUTANEOUS | Status: DC

## 2020-10-12 MED ORDER — PROPOFOL 500 MG/50ML IV EMUL
INTRAVENOUS | Status: DC | PRN
  Administered 2020-10-12: 100 ug/kg/min via INTRAVENOUS

## 2020-10-12 MED ORDER — LIDOCAINE HCL (CARDIAC) PF 100 MG/5ML IV SOSY
PREFILLED_SYRINGE | INTRAVENOUS | Status: DC | PRN
  Administered 2020-10-12: 50 mg via INTRATRACHEAL

## 2020-10-12 MED ORDER — LACTATED RINGERS IV SOLN: INTRAVENOUS | Status: DC | PRN

## 2020-10-12 MED ORDER — STERILE WATER FOR IRRIGATION IR SOLN
Status: DC | PRN
  Administered 2020-10-12: 100 mL

## 2020-10-12 NOTE — Discharge Instructions (Addendum)
Colonoscopy Discharge Instructions  Read the instructions outlined below and refer to this sheet in the next few weeks. These discharge instructions provide you with general information on caring for yourself after you leave the hospital. Your doctor may also give you specific instructions. While your treatment has been planned according to the most current medical practices available, unavoidable complications occasionally occur.   ACTIVITY  You may resume your regular activity, but move at a slower pace for the next 24 hours.   Take frequent rest periods for the next 24 hours.   Walking will help get rid of the air and reduce the bloated feeling in your belly (abdomen).   No driving for 24 hours (because of the medicine (anesthesia) used during the test).    Do not sign any important legal documents or operate any machinery for 24 hours (because of the anesthesia used during the test).  NUTRITION  Drink plenty of fluids.   You may resume your normal diet as instructed by your doctor.   Begin with a light meal and progress to your normal diet. Heavy or fried foods are harder to digest and may make you feel sick to your stomach (nauseated).   Avoid alcoholic beverages for 24 hours or as instructed.  MEDICATIONS  You may resume your normal medications unless your doctor tells you otherwise.  WHAT YOU CAN EXPECT TODAY  Some feelings of bloating in the abdomen.   Passage of more gas than usual.   Spotting of blood in your stool or on the toilet paper.  IF YOU HAD POLYPS REMOVED DURING THE COLONOSCOPY:  No aspirin products for 7 days or as instructed.   No alcohol for 7 days or as instructed.   Eat a soft diet for the next 24 hours.  FINDING OUT THE RESULTS OF YOUR TEST Not all test results are available during your visit. If your test results are not back during the visit, make an appointment with your caregiver to find out the results. Do not assume everything is normal if  you have not heard from your caregiver or the medical facility. It is important for you to follow up on all of your test results.  SEEK IMMEDIATE MEDICAL ATTENTION IF:  You have more than a spotting of blood in your stool.   Your belly is swollen (abdominal distention).   You are nauseated or vomiting.   You have a temperature over 101.   You have abdominal pain or discomfort that is severe or gets worse throughout the day.   Your colonoscopy revealed 2 polyp(s) which I removed successfully. Await pathology results, my office will contact you. I recommend repeating colonoscopy in 5 years for surveillance purposes. Follow up with GI as needed.   I hope you have a great rest of your week!  Elon Alas. Abbey Chatters, D.O. Gastroenterology and Hepatology [facility]     Monitored Anesthesia Care, Care After These instructions provide you with information about caring for yourself after your procedure. Your health care provider may also give you more specific instructions. Your treatment has been planned according to current medical practices, but problems sometimes occur. Call your health care provider if you have any problems or questions after your procedure. What can I expect after the procedure? After your procedure, you may:  Feel sleepy for several hours.  Feel clumsy and have poor balance for several hours.  Feel forgetful about what happened after the procedure.  Have poor judgment for several hours.  Feel nauseous or vomit.  Have a sore throat if you had a breathing tube during the procedure. Follow these instructions at home: For at least 24 hours after the procedure:      Have a responsible adult stay with you. It is important to have someone help care for you until you are awake and alert.  Rest as needed.  Do not: ? Participate in activities in which you could fall or become injured. ? Drive. ? Use heavy machinery. ? Drink  alcohol. ? Take sleeping pills or medicines that cause drowsiness. ? Make important decisions or sign legal documents. ? Take care of children on your own. Eating and drinking  Follow the diet that is recommended by your health care provider.  If you vomit, drink water, juice, or soup when you can drink without vomiting.  Make sure you have little or no nausea before eating solid foods. General instructions  Take over-the-counter and prescription medicines only as told by your health care provider.  If you have sleep apnea, surgery and certain medicines can increase your risk for breathing problems. Follow instructions from your health care provider about wearing your sleep device: ? Anytime you are sleeping, including during daytime naps. ? While taking prescription pain medicines, sleeping medicines, or medicines that make you drowsy.  If you smoke, do not smoke without supervision.  Keep all follow-up visits as told by your health care provider. This is important. Contact a health care provider if:  You keep feeling nauseous or you keep vomiting.  You feel light-headed.  You develop a rash.  You have a fever. Get help right away if:  You have trouble breathing. Summary  For several hours after your procedure, you may feel sleepy and have poor judgment.  Have a responsible adult stay with you for at least 24 hours or until you are awake and alert. This information is not intended to replace advice given to you by your health care provider. Make sure you discuss any questions you have with your health care provider. Document Revised: 01/08/2018 Document Reviewed: 01/31/2016 Elsevier Patient Education  [place].     Colon Polyps  Polyps are tissue growths inside the body. Polyps can grow in many places, including the large intestine (colon). A polyp may be a round bump or a mushroom-shaped growth. You could have one polyp or several. Most colon polyps are  noncancerous (benign). However, some colon polyps can become cancerous over time. Finding and removing the polyps early can help prevent this. What are the causes? The exact cause of colon polyps is not known. What increases the risk? You are more likely to develop this condition if you:  Have a family history of colon cancer or colon polyps.  Are older than 16 or older than 45 if you are African American.  Have inflammatory bowel disease, such as ulcerative colitis or Crohn's disease.  Have certain hereditary conditions, such as: ? Familial adenomatous polyposis. ? Lynch syndrome. ? Turcot syndrome. ? Peutz-Jeghers syndrome.  Are overweight.  Smoke cigarettes.  Do not get enough exercise.  Drink too much alcohol.  Eat a diet that is high in fat and red meat and low in fiber.  Had childhood cancer that was treated with abdominal radiation. What are the signs or symptoms? Most polyps do not cause symptoms. If you have symptoms, they may include:  Blood coming from your rectum when having a bowel movement.  Blood in your stool. The stool may look  dark red or black.  Abdominal pain.  A change in bowel habits, such as constipation or diarrhea. How is this diagnosed? This condition is diagnosed with a colonoscopy. This is a procedure in which a lighted, flexible scope is inserted into the anus and then passed into the colon to examine the area. Polyps are sometimes found when a colonoscopy is done as part of routine cancer screening tests. How is this treated? Treatment for this condition involves removing any polyps that are found. Most polyps can be removed during a colonoscopy. Those polyps will then be tested for cancer. Additional treatment may be needed depending on the results of testing. Follow these instructions at home: Lifestyle  Maintain a healthy weight, or lose weight if recommended by your health care provider.  Exercise every day or as told by your health  care provider.  Do not use any products that contain nicotine or tobacco, such as cigarettes and e-cigarettes. If you need help quitting, ask your health care provider.  If you drink alcohol, limit how much you have: ? 0-1 drink a day for women. ? 0-2 drinks a day for men.  Be aware of how much alcohol is in your drink. In the [place] one drink equals one 12 oz bottle of beer (355 mL), one 5 oz glass of wine (148 mL), or one 1 oz shot of hard liquor (44 mL). Eating and drinking   Eat foods that are high in fiber, such as fruits, vegetables, and whole grains.  Eat foods that are high in calcium and vitamin D, such as milk, cheese, yogurt, eggs, liver, fish, and broccoli.  Limit foods that are high in fat, such as fried foods and desserts.  Limit the amount of red meat and processed meat you eat, such as hot dogs, sausage, bacon, and lunch meats. General instructions  Keep all follow-up visits as told by your health care provider. This is important. ? This includes having regularly scheduled colonoscopies. ? Talk to your health care provider about when you need a colonoscopy. Contact a health care provider if:  You have new or worsening bleeding during a bowel movement.  You have new or increased blood in your stool.  You have a change in bowel habits.  You lose weight for no known reason. Summary  Polyps are tissue growths inside the body. Polyps can grow in many places, including the colon.  Most colon polyps are noncancerous (benign), but some can become cancerous over time.  This condition is diagnosed with a colonoscopy.  Treatment for this condition involves removing any polyps that are found. Most polyps can be removed during a colonoscopy. This information is not intended to replace advice given to you by your health care provider. Make sure you discuss any questions you have with your health care provider. Document Revised: 01/25/2018 Document Reviewed:  01/25/2018 Elsevier Patient Education  [place].

## 2020-10-12 NOTE — Anesthesia Postprocedure Evaluation (Signed)
Anesthesia Post Note  Patient: Electronics engineer  Procedure(s) Performed: COLONOSCOPY WITH PROPOFOL (N/A ) POLYPECTOMY  Patient location during evaluation: Endoscopy Anesthesia Type: General Level of consciousness: awake and alert Pain management: pain level controlled Vital Signs Assessment: post-procedure vital signs reviewed and stable Respiratory status: spontaneous breathing, nonlabored ventilation, respiratory function stable and patient connected to nasal cannula oxygen Cardiovascular status: blood pressure returned to baseline and stable Postop Assessment: no apparent nausea or vomiting Anesthetic complications: no   No complications documented.   Last Vitals:  Vitals:   10/12/20 1129 10/12/20 1324  BP: 113/75 111/82  Pulse: 77 72  Resp: 13 17  Temp: 36.8 C 36.4 C  SpO2: 96% 99%    Last Pain:  Vitals:   10/12/20 1324  TempSrc: Oral  PainSc: 0-No pain                 Talitha Givens

## 2020-10-12 NOTE — H&P (Signed)
Primary Care Physician:  [name], MD Primary Gastroenterologist:  Dr. Abbey Chatters  Pre-Procedure History & Physical: HPI:  Megan Allen is a 64 y.o. female is here for a colonoscopy for colon cancer screening purposes.  Patient denies any family history of colorectal cancer.  No melena or hematochezia.  No abdominal pain or unintentional weight loss.  No change in bowel habits.  Overall feels well from a GI standpoint.  Past Medical History:  Diagnosis Date  . Chronic back pain   . Headache   . Murmur   . Muscle spasm     Past Surgical History:  Procedure Laterality Date  . ABDOMINAL HYSTERECTOMY     partial  . BREAST CYST ASPIRATION      Prior to Admission medications   Medication Sig Start Date End Date Taking? Authorizing Provider  Ascorbic Acid (VITAMIN C) 1000 MG tablet Take 1,000 mg by mouth daily.   Yes [provider]  aspirin EC 81 MG tablet Take 81 mg by mouth daily.   Yes [provider]  bisacodyl (DULCOLAX) 5 MG EC tablet Take 5 mg by mouth daily as needed for moderate constipation.   Yes [provider]  Calcium Carb-Cholecalciferol 600-800 MG-UNIT TABS Take 1 tablet by mouth daily.   Yes [provider]  Cholecalciferol (VITAMIN D3 PO) Take 2,000 Units by mouth daily.   Yes [provider]  Cyanocobalamin (VITAMIN B-12 PO) Take 3,000 mcg by mouth daily.   Yes [provider]  Diclofenac Sodium 2 % SOLN Place onto the skin daily.   Yes [provider]  ECHINACEA PO Take 400 mg by mouth as needed.   Yes [provider]  EPINEPHrine 0.3 mg/0.3 mL IJ SOAJ injection Inject 0.3 mLs (0.3 mg total) into the muscle as needed for anaphylaxis (severe allergic reactions from bee or other insect stings resulting in shortness of breath). 06/25/20  Yes Elsie Stain, MD  famotidine (PEPCID) 40 MG tablet Take 1 tablet (40 mg total) by mouth daily. For five days. 06/25/20  Yes [name], MD   gabapentin (NEURONTIN) 300 MG capsule Take 1 capsule (300 mg total) by mouth at bedtime. Patient taking differently: Take 300 mg by mouth as needed. 09/04/18  Yes [name], MD  methocarbamol (ROBAXIN) 750 MG tablet Take 1 tablet (750 mg total) by mouth every 8 (eight) hours as needed for muscle spasms. Patient taking differently: Take 750 mg by mouth as needed for muscle spasms. 05/12/20  Yes [name], MD  Multiple Vitamin (MULTIVITAMIN) capsule Take 1 capsule by mouth daily.   Yes [provider]  omeprazole (PRILOSEC) 20 MG capsule Take 1 capsule (20 mg total) by mouth 2 (two) times daily before a meal. Patient taking differently: Take 20 mg by mouth as needed. 09/07/18  Yes Newlin, Charlane Ferretti, MD  UNABLE TO FIND Take 1 tablet by mouth as needed. OTC Calms for simple nervous tension and occasional sleeplessness.   Yes [provider]  acetaminophen (TYLENOL 8 HOUR ARTHRITIS PAIN) 650 MG CR tablet Take 650 mg by mouth as needed for pain.    [provider]  meclizine (ANTIVERT) 12.5 MG tablet Take 1 tablet (12.5 mg total) by mouth 3 (three) times daily as needed for dizziness. Patient taking differently: Take 12.5 mg by mouth as needed for dizziness. 05/03/20   Fawze, Mina A, PA-C  SUMAtriptan (IMITREX) 50 MG tablet Take 1 tab at onset of migraine.  May repeat in 2 hrs, if needed.  Max dose: 2 tabs/day. This is a 30 day prescription. Patient taking differently: as needed. Take 1 tab at onset of migraine.  May repeat in 2 hrs, if needed.  Max dose: 2 tabs/day. This is a 30 day prescription. 01/30/18   Marcial Pacas, MD    Allergies as of 09/25/2020 - Review Complete 09/23/2020  Allergen Reaction Noted  . Bee venom  06/25/2020  . Caffeine  01/30/2018    Family History  Problem Relation Age of Onset  . Hyperlipidemia Mother   . Diabetes Father   . Diabetes Brother   . Heart disease Daughter   . Stroke Maternal Grandfather     Social History    Socioeconomic History  . Marital status: Married    Spouse name: Not on file  . Number of children: 3  . Years of education: some college  . Highest education level: Not on file  Occupational History  . Occupation: Hair Dresser  Tobacco Use  . Smoking status: Never Smoker  . Smokeless tobacco: Never Used  Vaping Use  . Vaping Use: Never used  Substance and Sexual Activity  . Alcohol use: No  . Drug use: No  . Sexual activity: Yes    Birth control/protection: Surgical  Other Topics Concern  . Not on file  Social History Narrative   Lives at home with her husband.   Right-handed.   No caffeine per day.   Social Determinants of Health   Financial Resource Strain: Not on file  Food Insecurity: Not on file  Transportation Needs: Not on file  Physical Activity: Not on file  Stress: Not on file  Social Connections: Not on file  Intimate Partner Violence: Not on file    Review of Systems: See HPI, otherwise negative ROS  Impression/Plan: Megan Allen is here for a colonoscopy to be performed for colon cancer screening purposes.  The risks of the procedure including infection, bleed, or perforation as well as benefits, limitations, alternatives and imponderables have been reviewed with the patient. Questions have been answered. All parties agreeable.

## 2020-10-12 NOTE — Transfer of Care (Signed)
Immediate Anesthesia Transfer of Care Note  Patient: Megan Allen  Procedure(s) Performed: COLONOSCOPY WITH PROPOFOL (N/A ) POLYPECTOMY  Patient Location: PACU  Anesthesia Type:General  Level of Consciousness: awake, alert  and oriented  Airway & Oxygen Therapy: Patient Spontanous Breathing  Post-op Assessment: Report given to RN, Post -op Vital signs reviewed and stable and Patient moving all extremities X 4  Post vital signs: Reviewed and stable  Last Vitals:  Vitals Value Taken Time  BP    Temp    Pulse    Resp    SpO2      Last Pain:  Vitals:   10/12/20 1255  TempSrc:   PainSc: 0-No pain      Patients Stated Pain Goal: 7 (31/12/16 2446)  Complications: No complications documented.

## 2020-10-12 NOTE — Op Note (Signed)
[facility] Patient Name: Megan Allen Procedure Date: 10/12/2020 12:36 PM MRN: [id number] Date of Birth: [birth year] Attending MD: Elon Alas. Abbey Chatters DO CSN: [id number] Age: 64 Admit Type: Outpatient Procedure:                Colonoscopy Indications:              Screening for colorectal malignant neoplasm Providers:                Elon Alas. Abbey Chatters, DO, Lambert Mody, Caprice Kluver, Rosina Lowenstein, RN, Nelma Rothman, Technician Referring MD:              Medicines:                See the Anesthesia note for documentation of the                            administered medications Complications:            No immediate complications. Estimated Blood Loss:     Estimated blood loss was minimal. Procedure:                Pre-Anesthesia Assessment:                           - The anesthesia plan was to use monitored                            anesthesia care (MAC).                           After obtaining informed consent, the colonoscope                            was passed under direct vision. Throughout the                            procedure, the patient's blood pressure, pulse, and                            oxygen saturations were monitored continuously. The                            PCF-HQ190L ([id number]) scope was introduced through                            the anus and advanced to the the cecum, identified                            by appendiceal orifice and ileocecal valve. The                            colonoscopy was performed without difficulty. The                            patient tolerated  the procedure well. The quality                            of the bowel preparation was evaluated using the                            BBPS [facility] Bowel Preparation Scale) with scores                            of: Right Colon = 3, Transverse Colon = 3 and Left                            Colon = 3 (entire mucosa seen well with no residual                             staining, small fragments of stool or opaque                            liquid). The total BBPS score equals 9. Scope In: 1:01:55 PM Scope Out: 1:20:48 PM Scope Withdrawal Time: 0 hours 9 minutes 12 seconds  Total Procedure Duration: 0 hours 18 minutes 53 seconds  Findings:      The perianal and digital rectal examinations were normal.      Non-bleeding internal hemorrhoids were found during endoscopy.      A 7 mm polyp was found in the transverse colon. The polyp was sessile.       The polyp was removed with a cold snare. Resection and retrieval were       complete.      A 2 mm polyp was found in the transverse colon. The polyp was sessile.       The polyp was removed with a cold biopsy forceps. Resection and       retrieval were complete. Impression:               - Non-bleeding internal hemorrhoids.                           - One 7 mm polyp in the transverse colon, removed                            with a cold snare. Resected and retrieved.                           - One 2 mm polyp in the transverse colon, removed                            with a cold biopsy forceps. Resected and retrieved. Moderate Sedation:      Per Anesthesia Care Recommendation:           - Patient has a contact number available for                            emergencies. The signs and symptoms of potential  delayed complications were discussed with the                            patient. Return to normal activities tomorrow.                            Written discharge instructions were provided to the                            patient.                           - Resume previous diet.                           - Continue present medications.                           - Await pathology results.                           - Repeat colonoscopy in 5 years for surveillance.                           - Return to GI clinic PRN. Procedure Code(s):        --- Professional  ---                           [phone], Colonoscopy, flexible; with removal of                            tumor(s), polyp(s), or other lesion(s) by snare                            technique                           45380, 47, Colonoscopy, flexible; with biopsy,                            single or multiple Diagnosis Code(s):        --- Professional ---                           Z12.11, Encounter for screening for malignant                            neoplasm of colon                           K63.5, Polyp of colon                           K64.8, Other hemorrhoids CPT copyright 2019 American [employer] Association. All rights reserved. The codes documented in this report are preliminary and upon coder review may  be revised to meet current compliance requirements. Elon Alas. Abbey Chatters, DO [facility], DO 10/12/2020 1:25:54 PM  This report has been signed electronically. Number of Addenda: 0

## 2020-10-12 NOTE — Anesthesia Preprocedure Evaluation (Addendum)
Anesthesia Evaluation  Patient identified by MRN, date of birth, ID band Patient awake    Reviewed: Allergy & Precautions, NPO status , Patient's Chart, lab work & pertinent test results  Airway Mallampati: III  TM Distance: >3 FB Neck ROM: Full    Dental no notable dental hx. (+) Dental Advisory Given, Teeth Intact   Pulmonary neg pulmonary ROS,    Pulmonary exam normal breath sounds clear to auscultation       Cardiovascular Exercise Tolerance: Good Normal cardiovascular exam+ Valvular Problems/Murmurs  Rhythm:Regular Rate:Normal  03-May-2020 13:50:44 Accident System-HPED ROUTINE RECORD Sinus rhythm Borderline prolonged PR interval RAE, consider biatrial enlargement Abnormal R-wave progression, early transition No significant change since last tracing Confirmed by Dorie Rank [phone]) on 05/03/2020 1:53:51 PM Also confirmed by Dorie Rank [phone]), editor Hattie Perch ([id number]) on 05/04/2020 12:46:06 PM   Neuro/Psych  Headaches,    GI/Hepatic Neg liver ROS, GERD  Medicated and Controlled,  Endo/Other  negative endocrine ROS  Renal/GU negative Renal ROS     Musculoskeletal Sciatica    Abdominal   Peds  Hematology negative hematology ROS (+)   Anesthesia Other Findings   Reproductive/Obstetrics negative OB ROS                            Anesthesia Physical Anesthesia Plan  ASA: II  Anesthesia Plan: General   Post-op Pain Management:    Induction: Intravenous  PONV Risk Score and Plan: TIVA  Airway Management Planned: Nasal Cannula and Natural Airway  Additional Equipment:   Intra-op Plan:   Post-operative Plan:   Informed Consent: I have reviewed the patients History and Physical, chart, labs and discussed the procedure including the risks, benefits and alternatives for the proposed anesthesia with the patient or authorized representative who has indicated his/her  understanding and acceptance.     Dental advisory given  Plan Discussed with: CRNA and Surgeon  Anesthesia Plan Comments:         Anesthesia Quick Evaluation

## 2020-10-14 LAB — SURGICAL PATHOLOGY

## 2020-10-20 ENCOUNTER — Encounter (HOSPITAL_COMMUNITY): Payer: Self-pay | Admitting: Internal Medicine

## 2021-01-11 ENCOUNTER — Telehealth: Payer: Self-pay | Admitting: Cardiovascular Disease

## 2021-01-11 ENCOUNTER — Other Ambulatory Visit: Payer: Self-pay

## 2021-01-11 ENCOUNTER — Ambulatory Visit: Payer: Self-pay

## 2021-01-11 ENCOUNTER — Encounter (HOSPITAL_BASED_OUTPATIENT_CLINIC_OR_DEPARTMENT_OTHER): Payer: Self-pay | Admitting: *Deleted

## 2021-01-11 ENCOUNTER — Emergency Department (HOSPITAL_BASED_OUTPATIENT_CLINIC_OR_DEPARTMENT_OTHER)
Admission: EM | Admit: 2021-01-11 | Discharge: 2021-01-11 | Disposition: A | Discharge status: Home / Self Care | Payer: BLUE CROSS/BLUE SHIELD | Attending: Emergency Medicine | Admitting: Emergency Medicine

## 2021-01-11 ENCOUNTER — Emergency Department (HOSPITAL_BASED_OUTPATIENT_CLINIC_OR_DEPARTMENT_OTHER): Payer: BLUE CROSS/BLUE SHIELD

## 2021-01-11 DIAGNOSIS — R079 Chest pain, unspecified: Secondary | ICD-10-CM | POA: Insufficient documentation

## 2021-01-11 DIAGNOSIS — R519 Headache, unspecified: Secondary | ICD-10-CM | POA: Diagnosis not present

## 2021-01-11 DIAGNOSIS — R42 Dizziness and giddiness: Secondary | ICD-10-CM | POA: Insufficient documentation

## 2021-01-11 DIAGNOSIS — Z7982 Long term (current) use of aspirin: Secondary | ICD-10-CM | POA: Insufficient documentation

## 2021-01-11 LAB — BASIC METABOLIC PANEL
Anion gap: 9 (ref 5–15)
BUN: 14 mg/dL (ref 8–23)
CO2: 25 mmol/L (ref 22–32)
Calcium: 9.2 mg/dL (ref 8.9–10.3)
Chloride: 104 mmol/L (ref 98–111)
Creatinine, Ser: 0.77 mg/dL (ref 0.44–1.00)
GFR, Estimated: >60 mL/min (ref >60)
Glucose, Bld: 90 mg/dL (ref 70–99)
Potassium: 3.7 mmol/L (ref 3.5–5.1)
Sodium: 138 mmol/L (ref 135–145)

## 2021-01-11 LAB — TROPONIN I (HIGH SENSITIVITY): Troponin I (High Sensitivity): 3 ng/L (ref <18)

## 2021-01-11 LAB — CBC
HCT: 40.7 % (ref 36.0–46.0)
Hemoglobin: 13.6 g/dL (ref 12.0–15.0)
MCH: 31 pg (ref 26.0–34.0)
MCHC: 33.4 g/dL (ref 30.0–36.0)
MCV: 92.7 fL (ref 80.0–100.0)
Platelets: 188 10*3/uL (ref 150–400)
RBC: 4.39 MIL/uL (ref 3.87–5.11)
RDW: 13.3 % (ref 11.5–15.5)
WBC: 6.9 10*3/uL (ref 4.0–10.5)
nRBC: 0 % (ref 0.0–0.2)

## 2021-01-11 MED ORDER — ALUM & MAG HYDROXIDE-SIMETH 200-200-20 MG/5ML PO SUSP
30.0000 mL | Freq: Once | ORAL | Status: AC
  Administered 2021-01-11: 30 mL via ORAL
  Filled 2021-01-11: qty 30

## 2021-01-11 MED ORDER — LIDOCAINE VISCOUS HCL 2 % MT SOLN
15.0000 mL | Freq: Once | OROMUCOSAL | Status: AC
  Administered 2021-01-11: 15 mL via ORAL
  Filled 2021-01-11: qty 15

## 2021-01-11 NOTE — Telephone Encounter (Signed)
We can let her know if there are cancellations, but she should probably be seen by whoever has an availability if her symptoms are that concerning.  She can always transfer to me once her acute issue is resolved.

## 2021-01-11 NOTE — Telephone Encounter (Signed)
I contacted patient, she states that on Thursday and Friday she would eat and then notice some chest discomfort- she states she has had gas pains and acid reflux pain before but this felt different. She denies shortness of breath, or swelling. BP checked at home on Friday was 117/60- patient states her BP at home since then has been the same, no changes. She would like to be seen sooner- she is a new patient scheduled to be seen in July. I did advise that I could send a message to her nurse. I did advise her if the chest pain/discomfort came back and she was uncomfortable to go to the ER or Urgent Care to be evaluated. Patient verbalized understanding.  I did ask patient if she would want to see any other provider as a new patient, but she states she would want to only see Dr.Hewlett.   Will route to MD and nurse to advise of any sooner appointments.

## 2021-01-11 NOTE — ED Triage Notes (Signed)
Chest pain x 3 days. Pain feels like gas.

## 2021-01-11 NOTE — Telephone Encounter (Signed)
Patient's daughter called to see if there is anyway her mother could be seen sooner then there appt. Daughter stays mother is very concern bout they way she been feeling the last couple of months. Daughter couldn't go into details of the symptoms her mother is experience because the mother was at another appt at the time of the call. But the daughter did state that the mother say something about chest paints but dont know itf it is that or gas Please advice

## 2021-01-11 NOTE — Telephone Encounter (Signed)
Scheduled patient with Dr Margaretann Loveless 4/4  Aware of date, time and location

## 2021-01-11 NOTE — Telephone Encounter (Signed)
Pt c/o left chest pain and back pain between shoulder blade since Saturday. Pt stated the episodes come and go. Episodes last hours and are moderate in intensity. Pt c/o left have twitching. Daughter stated pt having no facial droop or arm drift. No slurred speech. Pt stated that she has a "nagging' pain to her temples and is "off balance" when she walks. Lightheaded when standing.  Pt thinks it could be related to lentil bread. Pt drank water mixed with sodium bicarbonate to make her burp and she stated she felt "some better." Advised tp to take aspiring and go to ED for evaluation. Pt wants to go to [facility]. Advised pt if [facility]CC thinks it is a heart issue, she will be referred to ED.  Care advice given and pt verbalized understanding.       Reason for Disposition . Dizziness or lightheadedness  Answer Assessment - Initial Assessment Questions 1. LOCATION: "Where does it hurt?"       Left breast 2. RADIATION: "Does the pain go anywhere else?" (e.g., into neck, jaw, arms, back)    Back, felt left hand twitching 3. ONSET: "When did the chest pain begin?" (Minutes, hours or days)      Last Friday 4. PATTERN "Does the pain come and go, or has it been constant since it started?"  "Does it get worse with exertion?"      Come and go-yes worse with exertion 5. DURATION: "How long does it last" (e.g., seconds, minutes, hours)     Hours-  6. SEVERITY: "How bad is the pain?"  (e.g., Scale 1-10; mild, moderate, or severe)    - MILD (1-3): doesn't interfere with normal activities     - MODERATE (4-7): interferes with normal activities or awakens from sleep    - SEVERE (8-10): excruciating pain, unable to do any normal activities       moderate 7. CARDIAC RISK FACTORS: "Do you have any history of heart problems or risk factors for heart disease?" (e.g., angina, prior heart attack; diabetes, high blood pressure, high cholesterol, smoker, or strong family history of heart disease)     Heart murmur at  childhood, father with heart disease, had h/o chest pain before and were evalauated 8. PULMONARY RISK FACTORS: "Do you have any history of lung disease?"  (e.g., blood clots in lung, asthma, emphysema, birth control pills)     no 9. CAUSE: "What do you think is causing the chest pain?"     Thought it was something she ate- drank some baking soda water and she burped had some relief after burping 10. OTHER SYMPTOMS: "Do you have any other symptoms?" (e.g., dizziness, nausea, vomiting, sweating, fever, difficulty breathing, cough)       Lightheadedness, temples nagging pain  Protocols used: CHEST PAIN-A-AH

## 2021-01-11 NOTE — Discharge Instructions (Signed)
Your work-up today was overall reassuring.  There is no evidence of a heart attack, acute stroke or any other life-threatening causes of your symptoms.  You may try to take some Maalox or possibly Gas-X, you may also try to take some Pepcid if this is more reflux related.  Please make sure to follow-up with your primary care doctor.  Return to the ER for any new or worsening symptoms.

## 2021-01-11 NOTE — ED Provider Notes (Signed)
[place] EMERGENCY DEPARTMENT Provider Note   CSN: [id number] Arrival date & time: 01/11/21  1430     History Chief Complaint  Patient presents with  . Chest Pain    Megan Allen is a 65 y.o. female.  HPI 65 year old female with a history of chronic back pain, headaches, murmur presents to the ER with complaints of central chest pain which feels like a squeezing, has been intermittent over the last 3 days.  She states that she does not know if this is cardiac chest pain or gas.  She states that she took some Gas-X with some mild relief, however the pain continued to persist.  She also endorses some dizziness which has been intermittent, no unilateral weakness, no word slurring, no vision changes.  She states that she does have this dizziness intermittently, as well as headaches.  She states that she was seen here in the ER had a CT scan of the head which was normal.  She was told that she has migraines.  She denies any nausea or vomiting.  No radiating pain, no shortness of breath, no abdominal pain.  No prior cardiac history.  She does not follow with cardiology.    Past Medical History:  Diagnosis Date  . Chronic back pain   . Headache   . Murmur   . Muscle spasm     Patient Active Problem List   Diagnosis Date Noted  . Insect bite of left thigh 06/25/2020  . Chronic migraine 05/29/2018  . Chronic nonintractable headache 01/30/2018  . Blurry vision 01/30/2018  . Back pain 10/13/2016  . Pain in joint, shoulder region 09/13/2016  . Pain in joint, ankle and foot 09/13/2016    Past Surgical History:  Procedure Laterality Date  . ABDOMINAL HYSTERECTOMY     partial  . BREAST CYST ASPIRATION    . COLONOSCOPY WITH PROPOFOL N/A 10/12/2020   Procedure: COLONOSCOPY WITH PROPOFOL;  Surgeon: Eloise Harman, DO;  Location: [facility] SUITE;  Service: Endoscopy;  Laterality: N/A;  12:45  . POLYPECTOMY  10/12/2020   Procedure: POLYPECTOMY;  Surgeon: Eloise Harman, DO;  Location: [facility] ENDO SUITE;  Service: Endoscopy;;     OB History   No obstetric history on file.     Family History  Problem Relation Age of Onset  . Hyperlipidemia Mother   . Diabetes Father   . Diabetes Brother   . Heart disease Daughter   . Stroke Maternal Grandfather     Social History   Tobacco Use  . Smoking status: Never Smoker  . Smokeless tobacco: Never Used  Vaping Use  . Vaping Use: Never used  Substance Use Topics  . Alcohol use: No  . Drug use: No    Home Medications Prior to Admission medications   Medication Sig Start Date End Date Taking? Authorizing Provider  aspirin EC 81 MG tablet Take 81 mg by mouth daily.   Yes [provider]  Cholecalciferol (VITAMIN D3 PO) Take 2,000 Units by mouth daily.   Yes [provider]  Multiple Vitamin (MULTIVITAMIN) capsule Take 1 capsule by mouth daily.   Yes [provider]  acetaminophen (TYLENOL 8 HOUR ARTHRITIS PAIN) 650 MG CR tablet Take 650 mg by mouth as needed for pain.    [provider]  Ascorbic Acid (VITAMIN C) 1000 MG tablet Take 1,000 mg by mouth daily.    [provider]  bisacodyl (DULCOLAX) 5 MG EC tablet Take 5 mg by mouth  daily as needed for moderate constipation.    [provider]  Calcium Carb-Cholecalciferol 600-800 MG-UNIT TABS Take 1 tablet by mouth daily.    [provider]  Cyanocobalamin (VITAMIN B-12 PO) Take 3,000 mcg by mouth daily.    [provider]  Diclofenac Sodium 2 % SOLN Place onto the skin daily.    [provider]  ECHINACEA PO Take 400 mg by mouth as needed.    [provider]  EPINEPHrine 0.3 mg/0.3 mL IJ SOAJ injection Inject 0.3 mLs (0.3 mg total) into the muscle as needed for anaphylaxis (severe allergic reactions from bee or other insect stings resulting in shortness of breath). 06/25/20   Elsie Stain, MD  famotidine (PEPCID) 40 MG tablet Take 1 tablet (40 mg total) by  mouth daily. For five days. 06/25/20   [name], MD  gabapentin (NEURONTIN) 300 MG capsule Take 1 capsule (300 mg total) by mouth at bedtime. Patient taking differently: Take 300 mg by mouth as needed. 09/04/18   [name], MD  meclizine (ANTIVERT) 12.5 MG tablet Take 1 tablet (12.5 mg total) by mouth 3 (three) times daily as needed for dizziness. Patient taking differently: Take 12.5 mg by mouth as needed for dizziness. 05/03/20   Fawze, Mina A, PA-C  methocarbamol (ROBAXIN) 750 MG tablet Take 1 tablet (750 mg total) by mouth every 8 (eight) hours as needed for muscle spasms. Patient taking differently: Take 750 mg by mouth as needed for muscle spasms. 05/12/20   [name], MD  omeprazole (PRILOSEC) 20 MG capsule Take 1 capsule (20 mg total) by mouth 2 (two) times daily before a meal. Patient taking differently: Take 20 mg by mouth as needed. 09/07/18   [name], MD  SUMAtriptan (IMITREX) 50 MG tablet Take 1 tab at onset of migraine.  May repeat in 2 hrs, if needed.  Max dose: 2 tabs/day. This is a 30 day prescription. Patient taking differently: as needed. Take 1 tab at onset of migraine.  May repeat in 2 hrs, if needed.  Max dose: 2 tabs/day. This is a 30 day prescription. 01/30/18   Marcial Pacas, MD  UNABLE TO FIND Take 1 tablet by mouth as needed. OTC Calms for simple nervous tension and occasional sleeplessness.    [provider]    Allergies    Bee venom and Caffeine  Review of Systems   Review of Systems  Constitutional: Negative for chills and fever.  HENT: Negative for ear pain and sore throat.   Eyes: Negative for pain and visual disturbance.  Respiratory: Negative for cough and shortness of breath.   Cardiovascular: Positive for chest pain. Negative for palpitations.  Gastrointestinal: Negative for abdominal pain and vomiting.  Genitourinary: Negative for dysuria and hematuria.  Musculoskeletal: Negative for arthralgias and back pain.  Skin: Negative  for color change and rash.  Neurological: Positive for dizziness and headaches. Negative for seizures and syncope.  All other systems reviewed and are negative.   Physical Exam Updated Vital Signs BP 121/75   Pulse 72   Temp 98.2 F (36.8 C) (Oral)   Resp 17   Ht 5' 6.5" (1.689 m)   Wt 95.2 kg   SpO2 98%   BMI 33.37 kg/m   Physical Exam Vitals and nursing note reviewed.  Constitutional:      General: She is not in acute distress.    Appearance: She is well-developed. She is not ill-appearing or diaphoretic.  HENT:     Head: Normocephalic  and atraumatic.  Eyes:     Conjunctiva/sclera: Conjunctivae normal.  Cardiovascular:     Rate and Rhythm: Normal rate and regular rhythm.     Heart sounds: Normal heart sounds. No murmur heard.   Pulmonary:     Effort: Pulmonary effort is normal. No respiratory distress.     Breath sounds: Normal breath sounds. No decreased breath sounds, wheezing, rhonchi or rales.  Abdominal:     Palpations: Abdomen is soft.     Tenderness: There is no abdominal tenderness.  Musculoskeletal:     Cervical back: Neck supple.     Right lower leg: No tenderness. No edema.     Left lower leg: No tenderness. No edema.  Skin:    General: Skin is warm and dry.  Neurological:     General: No focal deficit present.     Mental Status: She is alert.     Comments: Mental Status:  Alert, thought content appropriate, able to give a coherent history. Speech fluent without evidence of aphasia. Able to follow 2 step commands without difficulty.  Cranial Nerves:  II: Peripheral visual fields grossly normal, pupils equal, round, reactive to light III,IV, VI: ptosis not present, extra-ocular motions intact bilaterally  V,VII: smile symmetric, facial light touch sensation equal VIII: hearing grossly normal to voice  X: uvula elevates symmetrically  XI: bilateral shoulder shrug symmetric and strong XII: midline tongue extension without fassiculations Motor:   Normal tone. 5/5 strength of BUE and BLE major muscle groups including strong and equal grip strength and dorsiflexion/plantar flexion Sensory: light touch normal in all extremities. Cerebellar: normal finger-to-nose with bilateral upper extremities, Romberg sign absent Gait: not accessed   Psychiatric:        Mood and Affect: Mood normal.        Behavior: Behavior normal.     ED Results / Procedures / Treatments   Labs (all labs ordered are listed, but only abnormal results are displayed) Labs Reviewed  BASIC METABOLIC PANEL  CBC  TROPONIN I (HIGH SENSITIVITY)  TROPONIN I (HIGH SENSITIVITY)    EKG EKG Interpretation  Date/Time:  Monday January 11 2021 14:43:10 EDT Ventricular Rate:  71 PR Interval:  210 QRS Duration: 84 QT Interval:  390 QTC Calculation: 423 R Axis:   55 Text Interpretation: Sinus rhythm with 1st degree A-V block Otherwise normal ECG No significant change since last tracing Confirmed by Calvert Cantor [phone]) on 01/11/2021 5:46:12 PM   Radiology DG Chest 2 View  Result Date: 01/11/2021 CLINICAL DATA:  Chest pain EXAM: CHEST - 2 VIEW COMPARISON:  May 03, 2020 FINDINGS: Lungs are clear. Heart size and pulmonary vascularity are normal. No adenopathy. No pneumothorax. No bone lesions. IMPRESSION: Lungs clear.  Cardiac silhouette normal. Electronically Signed   By: Lowella Grip III M.D.   On: 01/11/2021 15:31    Procedures Procedures   Medications Ordered in ED Medications  alum & mag hydroxide-simeth (MAALOX/MYLANTA) 200-200-20 MG/5ML suspension 30 mL (30 mLs Oral Given 01/11/21 1640)    And  lidocaine (XYLOCAINE) 2 % viscous mouth solution 15 mL (15 mLs Oral Given 01/11/21 1640)    ED Course  I have reviewed the triage vital signs and the nursing notes.  Pertinent labs & imaging results that were available during my care of the patient were reviewed by me and considered in my medical decision making (see chart for details).    MDM  Rules/Calculators/A&P  65 year old female with intermittent chest pain over the last 3 days.  On arrival, she is well-appearing, no acute distress, resting calmly in the ER bed.  Vitals overall reassuring.  Physical exam with clear lung sounds, no lower extremity edema, abdomen soft and nontender.  Nondiaphoretic.  DDx includes ACS, PE, dissection, GERD, pneumonia  Lab work overall here reassuring.  BMP and CMP largely unremarkable.  Initial troponin of 3.  Chest x-ray without acute abnormalities.  EKG normal sinus rhythm with first-degree AV block.  Largely unchanged from prior.  Patient was given GI cocktail here.  Asymptomatic here in the ER.  No evidence of ACS, low suspicion for PE, dissection.  Patient is well-appearing.  I did review her CT scan from a year ago, which was overall normal.  She has no evidence of acute stroke here at this time.  I discussed this with her daughters over the phone who are overall reassured.  Stressed follow-up with the PCP, may require further cardiology referral though I am not overly convinced that this is cardiac related. Recommended Gas-X or Pepcid if this is more GERD related. We discussed return precautions. She voiced understanding and is agreeable.  Stable for discharge. Final Clinical Impression(s) / ED Diagnoses Final diagnoses:  Chest pain, unspecified type    Rx / DC Orders ED Discharge Orders    None       Lyndel Safe 01/11/21 1805    Truddie Hidden, MD 01/11/21 2157

## 2021-01-25 ENCOUNTER — Ambulatory Visit: Payer: BLUE CROSS/BLUE SHIELD | Admitting: Internal Medicine

## 2021-01-25 ENCOUNTER — Other Ambulatory Visit: Payer: Self-pay

## 2021-01-25 ENCOUNTER — Encounter: Payer: Self-pay | Admitting: Internal Medicine

## 2021-01-25 VITALS — BP 130/72 | HR 69 | Ht 66.0 in | Wt 206.0 lb

## 2021-01-25 DIAGNOSIS — E785 Hyperlipidemia, unspecified: Secondary | ICD-10-CM

## 2021-01-25 DIAGNOSIS — R002 Palpitations: Secondary | ICD-10-CM | POA: Diagnosis not present

## 2021-01-25 DIAGNOSIS — R072 Precordial pain: Secondary | ICD-10-CM

## 2021-01-25 MED ORDER — METOPROLOL TARTRATE 50 MG PO TABS
50.0000 mg | ORAL_TABLET | Freq: Once | ORAL | 0 refills | Status: DC
  Filled 2021-01-25: qty 1, 1d supply, fill #0

## 2021-01-25 NOTE — Progress Notes (Signed)
Cardiology Office Note:    Date:  01/25/2021   ID:  Melford Aase, DOB 09/09/1956, MRN [id number]  PCP:  [name], MD  Cardiologist:  No primary care provider on file.  Electrophysiologist:  None   Referring MD: [name], MD   Chief Complaint/Reason for Referral: Chest pain  History of Present Illness:    Wilba Seegars Depasquale is a 65 y.o. female with a history of HLD who presents for evaluation of chest pain. Presents with her daughter Lexine Baton who provides additional history.  She describes her symptoms as a sensation in chest - feels like trapped gas. She tells me she had a heart murmur as child. Chest sensation - with caffeine or energy bar, feels like she has chest pain. Can happen at rest. Chest pain for 48 hr - March 21st, went to ED and ruled out for MI. No SOB but doesn't push herself to know if she has exertional symptoms. Left sided chest pain, nonreproducible with palpitation. Occurs with palpitations and without. Takes a baby aspirin. She is concerned about palpitations since her daughter has afib (secondary to valvular heart disease). Symptoms are weekly to monthly, seconds to minutes.   Snores - not been screened for OSA.   Fhx: brother died in 49 of possible seizure. Daughter had rheumatic fever with MVR.   Smoking history = 5 year 1 pk per day, when she was younger.   Past Medical History:  Diagnosis Date  . Chronic back pain   . Headache   . Murmur   . Muscle spasm     Past Surgical History:  Procedure Laterality Date  . ABDOMINAL HYSTERECTOMY     partial  . BREAST CYST ASPIRATION    . COLONOSCOPY WITH PROPOFOL N/A 10/12/2020   Procedure: COLONOSCOPY WITH PROPOFOL;  Surgeon: Eloise Harman, DO;  [facility]: [facility];  Service: Endoscopy;  Laterality: N/A;  12:45  . POLYPECTOMY  10/12/2020   Procedure: POLYPECTOMY;  Surgeon: Eloise Harman, DO;  Location: [facility];  Service: Endoscopy;;    Current Medications: Current  Meds  Medication Sig  . acetaminophen (TYLENOL) 650 MG CR tablet Take 650 mg by mouth as needed for pain.  . Ascorbic Acid (VITAMIN C) 1000 MG tablet Take 1,000 mg by mouth daily.  Marland Kitchen aspirin EC 81 MG tablet Take 81 mg by mouth daily.  . bisacodyl (DULCOLAX) 5 MG EC tablet Take 5 mg by mouth daily as needed for moderate constipation.  . Calcium Carb-Cholecalciferol 600-800 MG-UNIT TABS Take 1 tablet by mouth daily.  . Cholecalciferol (VITAMIN D3 PO) Take 2,000 Units by mouth daily.  . Cyanocobalamin (VITAMIN B-12 PO) Take 3,000 mcg by mouth daily.  . Diclofenac Sodium 2 % SOLN Place onto the skin daily.  Marland Kitchen ECHINACEA PO Take 400 mg by mouth as needed.  Marland Kitchen EPINEPHrine 0.3 mg/0.3 mL IJ SOAJ injection Inject 0.3 mLs (0.3 mg total) into the muscle as needed for anaphylaxis (severe allergic reactions from bee or other insect stings resulting in shortness of breath).  . famotidine (PEPCID) 40 MG tablet Take 1 tablet (40 mg total) by mouth daily. For five days.  Marland Kitchen gabapentin (NEURONTIN) 300 MG capsule Take 1 capsule (300 mg total) by mouth at bedtime. (Patient taking differently: Take 300 mg by mouth as needed.)  . meclizine (ANTIVERT) 12.5 MG tablet Take 1 tablet (12.5 mg total) by mouth 3 (three) times daily as needed for dizziness. (Patient taking differently: Take 12.5 mg by mouth as needed  for dizziness.)  . methocarbamol (ROBAXIN) 750 MG tablet Take 1 tablet (750 mg total) by mouth every 8 (eight) hours as needed for muscle spasms. (Patient taking differently: Take 750 mg by mouth as needed for muscle spasms.)  . Multiple Vitamin (MULTIVITAMIN) capsule Take 1 capsule by mouth daily.  . Na Sulfate-K Sulfate-Mg Sulf 17.5-3.13-1.6 GM/177ML SOLN TAKE AS DIRECTED  . omeprazole (PRILOSEC) 20 MG capsule Take 1 capsule (20 mg total) by mouth 2 (two) times daily before a meal. (Patient taking differently: Take 20 mg by mouth as needed.)  . SUMAtriptan (IMITREX) 50 MG tablet Take 1 tab at onset of migraine.   May repeat in 2 hrs, if needed.  Max dose: 2 tabs/day. This is a 30 day prescription. (Patient taking differently: as needed. Take 1 tab at onset of migraine.  May repeat in 2 hrs, if needed.  Max dose: 2 tabs/day. This is a 30 day prescription.)  . UNABLE TO FIND Take 1 tablet by mouth as needed. OTC Calms for simple nervous tension and occasional sleeplessness.     Allergies:   Bee venom and Caffeine   Social History   Tobacco Use  . Smoking status: Never Smoker  . Smokeless tobacco: Never Used  Vaping Use  . Vaping Use: Never used  Substance Use Topics  . Alcohol use: No  . Drug use: No     Family History: The patient's family history includes Diabetes in her brother and father; Heart disease in her daughter; Hyperlipidemia in her mother; Stroke in her maternal grandfather.  ROS:   Please see the history of present illness.    All other systems reviewed and are negative.  EKGs/Labs/Other Studies Reviewed:    The following studies were reviewed today:  EKG:  NSR, possible LAE  Recent Labs: 05/03/2020: ALT 18 01/11/2021: BUN 14; Creatinine, Ser 0.77; Hemoglobin 13.6; Platelets 188; Potassium 3.7; Sodium 138  Recent Lipid Panel    Component Value Date/Time   CHOL 194 06/11/2020 1041   TRIG 58 06/11/2020 1041   HDL 61 06/11/2020 1041   CHOLHDL 3.2 06/11/2020 1041   CHOLHDL 3.5 09/13/2016 1202   VLDL 12 09/13/2016 1202   LDLCALC 122 (H) 06/11/2020 1041    Physical Exam:    VS:  BP 130/72   Pulse 69   Ht 5\' 6"  (1.676 m)   Wt 206 lb (93.4 kg)   SpO2 98%   BMI 33.25 kg/m     Wt Readings from Last 5 Encounters:  01/25/21 206 lb (93.4 kg)  01/11/21 209 lb 14.1 oz (95.2 kg)  10/12/20 210 lb (95.3 kg)  09/23/20 216 lb 3.2 oz (98.1 kg)  06/25/20 220 lb (99.8 kg)    Constitutional: No acute distress Eyes: sclera non-icteric, normal conjunctiva and lids ENMT: normal dentition, moist mucous membranes Cardiovascular: regular rhythm, normal rate, no murmurs. S1 and S2  normal. Radial pulses normal bilaterally. No jugular venous distention.  Respiratory: clear to auscultation bilaterally GI : normal bowel sounds, soft and nontender. No distention.   MSK: extremities warm, well perfused. No edema.  NEURO: grossly nonfocal exam, moves all extremities. PSYCH: alert and oriented x 3, normal mood and affect.   ASSESSMENT:    1. Palpitations   2. Precordial pain   3. Hyperlipidemia, unspecified hyperlipidemia type    PLAN:    Palpitations - Plan: EKG 12-Lead, Cardiac event monitor, ECHOCARDIOGRAM COMPLETE, Basic metabolic panel - will obtain an echo and monitor to further evaluate palpitations. Family concerned could represent  afib, will screen.   Precordial pain - Plan: EKG 12-Lead, Cardiac event monitor, ECHOCARDIOGRAM COMPLETE, Basic metabolic panel, CT CORONARY MORPH W/CTA COR W/SCORE W/CA W/CM &/OR WO/CM - for chest sensation and chest pain, will obtain CCTA. Risk factors include HLD, age  Hyperlipidemia, unspecified hyperlipidemia type - LDL 121, not currently on lipid lowering therapy. Will risk stratify after CCTA.   Cherlynn Kaiser, MD, [facility]    Medication Adjustments/Labs and Tests Ordered: Current medicines are reviewed at length with the patient today.  Concerns regarding medicines are outlined above.   Orders Placed This Encounter  Procedures  . CT CORONARY MORPH W/CTA COR W/SCORE W/CA W/CM &/OR WO/CM  . CT CORONARY FRACTIONAL FLOW RESERVE DATA PREP  . CT CORONARY FRACTIONAL FLOW RESERVE FLUID ANALYSIS  . Basic metabolic panel  . Cardiac event monitor  . EKG 12-Lead  . ECHOCARDIOGRAM COMPLETE    Meds ordered this encounter  Medications  . metoprolol tartrate (LOPRESSOR) 50 MG tablet    Sig: Take 1 tablet (50 mg total) by mouth once for 1 dose. PLEASE TAKE METOPROLOL 2  HOURS PRIOR TO CTA SCAN.    Dispense:  1 tablet    Refill:  0    Patient Instructions  Medication Instructions:  PLEASE TAKE  50mg  (1 TABLET) METOPROLOL TARTRATE  PRIOR TO CCTA SCAN  *If you need a refill on your cardiac medications before your next appointment, please call your pharmacy*  Lab Work: BMET-PLEASE HAVE THIS DONE 7 DAYS PRIOR TO CCTA  If you have labs (blood work) drawn today and your tests are completely normal, you will receive your results only by: Marland Kitchen MyChart Message (if you have MyChart) OR . A paper copy in the mail If you have any lab test that is abnormal or we need to change your treatment, we will call you to review the results.  Testing/Procedures: Your physician has requested that you have an echocardiogram. Echocardiography is a painless test that uses sound waves to create images of your heart. It provides your doctor with information about the size and shape of your heart and how well your heart's chambers and valves are working. You may receive an ultrasound enhancing agent through an IV if needed to better visualize your heart during the echo.This procedure takes approximately one hour. There are no restrictions for this procedure. This will take place at the [address], [address].   Your physician has requested that you have cardiac CT. Cardiac computed tomography (CT) is a painless test that uses an x-ray machine to take clear, detailed pictures of your heart. For further information please visit [employer]. Please follow instruction sheet as given.  [employer] Cardiac Event Monitor Instructions Your physician has requested you wear your cardiac event monitor for 30 days, (1-30). [employer] may call or text to confirm a shipping address. The monitor will be sent to a land address via UPS. [employer] will not ship a monitor to a PO BOX. It typically takes 3-5 days to receive your monitor after it has been enrolled. [employer] will assist with USPS tracking if your package is delayed. The telephone number for [employer] is [phone]. Once you have received your monitor,  please review the enclosed instructions. Instruction tutorials can also be viewed under help and settings on the enclosed cell phone. Your monitor has already been registered assigning a specific monitor serial # to you.  Applying the monitor Remove cell phone from case and turn it on. The  cell phone works as Dealer and needs to be within [employer] of you at all times. The cell phone will need to be charged on a daily basis. We recommend you plug the cell phone into the enclosed charger at your bedside table every night.  Monitor batteries: You will receive two monitor batteries labelled #1 and #2. These are your recorders. Plug battery #2 onto the second connection on the enclosed charger. Keep one battery on the charger at all times. This will keep the monitor battery deactivated. It will also keep it fully charged for when you need to switch your monitor batteries. A small light will be blinking on the battery emblem when it is charging. The light on the battery emblem will remain on when the battery is fully charged.  Open package of a Monitor strip. Insert battery #1 into black hood on strip and gently squeeze monitor battery onto connection as indicated in instruction booklet. Set aside while preparing skin.  Choose location for your strip, vertical or horizontal, as indicated in the instruction booklet. Shave to remove all hair from location. There cannot be any lotions, oils, powders, or colognes on skin where monitor is to be applied. Wipe skin clean with enclosed Saline wipe. Dry skin completely.  Peel paper labeled #1 off the back of the Monitor strip exposing the adhesive. Place the monitor on the chest in the vertical or horizontal position shown in the instruction booklet. One arrow on the monitor strip must be pointing upward. Carefully remove paper labeled #2, attaching remainder of strip to your skin. Try not to create any folds or wrinkles in the strip as you apply  it.  Firmly press and release the circle in the center of the monitor battery. You will hear a small beep. This is turning the monitor battery on. The heart emblem on the monitor battery will light up every 5 seconds if the monitor battery in turned on and connected to the patient securely. Do not push and hold the circle down as this turns the monitor battery off. The cell phone will locate the monitor battery. A screen will appear on the cell phone checking the connection of your monitor strip. This may read poor connection initially but change to good connection within the next minute. Once your monitor accepts the connection you will hear a series of 3 beeps followed by a climbing crescendo of beeps. A screen will appear on the cell phone showing the two monitor strip placement options. Touch the picture that demonstrates where you applied the monitor strip.  Your monitor strip and battery are waterproof. You are able to shower, bathe, or swim with the monitor on. They just ask you do not submerge deeper than 3 feet underwater. We recommend removing the monitor if you are swimming in a lake, river, or ocean.  Your monitor battery will need to be switched to a fully charged monitor battery approximately once a week. The cell phone will alert you of an action which needs to be made.  On the cell phone, tap for details to reveal connection status, monitor battery status, and cell phone battery status. The green dots indicates your monitor is in good status. A red dot indicates there is something that needs your attention.  To record a symptom, click the circle on the monitor battery. In 30-60 seconds a list of symptoms will appear on the cell phone. Select your symptom and tap save. Your monitor will record a sustained or significant  arrhythmia regardless of you clicking the button. Some patients do not feel the heart rhythm irregularities. [employer] will notify us of any serious or  critical events.  Refer to instruction booklet for instructions on switching batteries, changing strips, the Do not disturb or Pause features, or any additional questions.  Call [employer] at [phone], to confirm your monitor is transmitting and record your baseline. They will answer any questions you may have regarding the monitor instructions at that time.  Returning the monitor to [place] all equipment back into blue box. Peel off strip of paper to expose adhesive and close box securely. There is a prepaid UPS shipping label on this box. Drop in a UPS drop box, or at a UPS facility like [employer]. You may also contact [employer] to arrange UPS to pick up monitor package at your home.  Follow-Up: At [facility], you and your health needs are our priority.  As part of our continuing mission to provide you with exceptional heart care, we have created designated Provider Care Teams.  These Care Teams include your primary Cardiologist (physician) and Advanced Practice Providers (APPs -  Physician Assistants and Nurse Practitioners) who all work together to provide you with the care you need, when you need it.  Your next appointment:   6 WEEKS  The format for your next appointment:   In Person  Provider:   Cherlynn Kaiser, MD  Other Instructions Your cardiac CT will be scheduled at one of the below locations:   [facility] [address] [place], [place] [address] [phone]  If scheduled at [facility], please arrive at the [facility] main entrance (entrance A) of [facility] 30 minutes prior to test start time. Proceed to the [facility] Radiology Department (first floor) to check-in and test prep.  Please follow these instructions carefully (unless otherwise directed):  On the Night Before the Test: . Be sure to Drink plenty of water. . Do not consume any caffeinated/decaffeinated beverages or chocolate 12 hours prior to your  test. . Do not take any antihistamines 12 hours prior to your test.  On the Day of the Test: . Drink plenty of water until 1 hour prior to the test. . Do not eat any food 4 hours prior to the test. . You may take your regular medications prior to the test.  . Take metoprolol (Lopressor) 50mg  (1 TABLET) two hours prior to test. . HOLD Furosemide/Hydrochlorothiazide morning of the test. . FEMALES- please wear underwire-free bra if available  After the Test: . Drink plenty of water. . After receiving IV contrast, you may experience a mild flushed feeling. This is normal. . On occasion, you may experience a mild rash up to 24 hours after the test. This is not dangerous. If this occurs, you can take Benadryl 25 mg and increase your fluid intake. . If you experience trouble breathing, this can be serious. If it is severe call 911 IMMEDIATELY. If it is mild, please call our office. . If you take any of these medications: Glipizide/Metformin, Avandament, Glucavance, please do not take 48 hours after completing test unless otherwise instructed.  Once we have confirmed authorization from your insurance company, we will call you to set up a date and time for your test. Based on how quickly your insurance processes prior authorizations requests, please allow up to 4 weeks to be contacted for scheduling your Cardiac CT appointment. Be advised that routine Cardiac CT appointments could be scheduled as  many as 8 weeks after your provider has ordered it.  For non-scheduling related questions, please contact the cardiac imaging nurse navigator should you have any questions/concerns: Marchia Bond, Cardiac Imaging Nurse Navigator Gordy Clement, Cardiac Imaging Nurse Navigator [facility] Direct Office Dial: [phone]   For scheduling needs, including cancellations and rescheduling, please call [place], [phone].

## 2021-01-25 NOTE — Patient Instructions (Signed)
Medication Instructions:  PLEASE TAKE 50mg  (1 TABLET) METOPROLOL TARTRATE  PRIOR TO CCTA SCAN  *If you need a refill on your cardiac medications before your next appointment, please call your pharmacy*  Lab Work: BMET-PLEASE HAVE THIS DONE 7 DAYS PRIOR TO CCTA  If you have labs (blood work) drawn today and your tests are completely normal, you will receive your results only by: Marland Kitchen MyChart Message (if you have MyChart) OR . A paper copy in the mail If you have any lab test that is abnormal or we need to change your treatment, we will call you to review the results.  Testing/Procedures: Your physician has requested that you have an echocardiogram. Echocardiography is a painless test that uses sound waves to create images of your heart. It provides your doctor with information about the size and shape of your heart and how well your heart's chambers and valves are working. You may receive an ultrasound enhancing agent through an IV if needed to better visualize your heart during the echo.This procedure takes approximately one hour. There are no restrictions for this procedure. This will take place at the [address], [address].   Your physician has requested that you have cardiac CT. Cardiac computed tomography (CT) is a painless test that uses an x-ray machine to take clear, detailed pictures of your heart. For further information please visit [employer]. Please follow instruction sheet as given.  [employer] Cardiac Event Monitor Instructions Your physician has requested you wear your cardiac event monitor for 30 days, (1-30). [employer] may call or text to confirm a shipping address. The monitor will be sent to a land address via UPS. [employer] will not ship a monitor to a PO BOX. It typically takes 3-5 days to receive your monitor after it has been enrolled. [employer] will assist with USPS tracking if your package is delayed. The telephone number for [employer] is  [phone]. Once you have received your monitor, please review the enclosed instructions. Instruction tutorials can also be viewed under help and settings on the enclosed cell phone. Your monitor has already been registered assigning a specific monitor serial # to you.  Applying the monitor Remove cell phone from case and turn it on. The cell phone works as Dealer and needs to be within [employer] of you at all times. The cell phone will need to be charged on a daily basis. We recommend you plug the cell phone into the enclosed charger at your bedside table every night.  Monitor batteries: You will receive two monitor batteries labelled #1 and #2. These are your recorders. Plug battery #2 onto the second connection on the enclosed charger. Keep one battery on the charger at all times. This will keep the monitor battery deactivated. It will also keep it fully charged for when you need to switch your monitor batteries. A small light will be blinking on the battery emblem when it is charging. The light on the battery emblem will remain on when the battery is fully charged.  Open package of a Monitor strip. Insert battery #1 into black hood on strip and gently squeeze monitor battery onto connection as indicated in instruction booklet. Set aside while preparing skin.  Choose location for your strip, vertical or horizontal, as indicated in the instruction booklet. Shave to remove all hair from location. There cannot be any lotions, oils, powders, or colognes on skin where monitor is to be applied. Wipe skin clean with enclosed Saline wipe. Dry skin completely.  Peel paper labeled #1 off the back of the Monitor strip exposing the adhesive. Place the monitor on the chest in the vertical or horizontal position shown in the instruction booklet. One arrow on the monitor strip must be pointing upward. Carefully remove paper labeled #2, attaching remainder of strip to your skin. Try not  to create any folds or wrinkles in the strip as you apply it.  Firmly press and release the circle in the center of the monitor battery. You will hear a small beep. This is turning the monitor battery on. The heart emblem on the monitor battery will light up every 5 seconds if the monitor battery in turned on and connected to the patient securely. Do not push and hold the circle down as this turns the monitor battery off. The cell phone will locate the monitor battery. A screen will appear on the cell phone checking the connection of your monitor strip. This may read poor connection initially but change to good connection within the next minute. Once your monitor accepts the connection you will hear a series of 3 beeps followed by a climbing crescendo of beeps. A screen will appear on the cell phone showing the two monitor strip placement options. Touch the picture that demonstrates where you applied the monitor strip.  Your monitor strip and battery are waterproof. You are able to shower, bathe, or swim with the monitor on. They just ask you do not submerge deeper than 3 feet underwater. We recommend removing the monitor if you are swimming in a lake, river, or ocean.  Your monitor battery will need to be switched to a fully charged monitor battery approximately once a week. The cell phone will alert you of an action which needs to be made.  On the cell phone, tap for details to reveal connection status, monitor battery status, and cell phone battery status. The green dots indicates your monitor is in good status. A red dot indicates there is something that needs your attention.  To record a symptom, click the circle on the monitor battery. In 30-60 seconds a list of symptoms will appear on the cell phone. Select your symptom and tap save. Your monitor will record a sustained or significant arrhythmia regardless of you clicking the button. Some patients do not feel the heart rhythm  irregularities. [employer] will notify us of any serious or critical events.  Refer to instruction booklet for instructions on switching batteries, changing strips, the Do not disturb or Pause features, or any additional questions.  Call [employer] at [phone], to confirm your monitor is transmitting and record your baseline. They will answer any questions you may have regarding the monitor instructions at that time.  Returning the monitor to [place] all equipment back into blue box. Peel off strip of paper to expose adhesive and close box securely. There is a prepaid UPS shipping label on this box. Drop in a UPS drop box, or at a UPS facility like [employer]. You may also contact [employer] to arrange UPS to pick up monitor package at your home.  Follow-Up: At [facility], you and your health needs are our priority.  As part of our continuing mission to provide you with exceptional heart care, we have created designated Provider Care Teams.  These Care Teams include your primary Cardiologist (physician) and Advanced Practice Providers (APPs -  Physician Assistants and Nurse Practitioners) who all work together to provide you with the care you need, when you need it.  Your next  appointment:   6 WEEKS  The format for your next appointment:   In Person  Provider:   Cherlynn Kaiser, MD  Other Instructions Your cardiac CT will be scheduled at one of the below locations:   [facility] [address] [place], [place] [address] [phone]  If scheduled at [facility], please arrive at the [facility] main entrance (entrance A) of [facility] 30 minutes prior to test start time. Proceed to the [facility] Radiology Department (first floor) to check-in and test prep.  Please follow these instructions carefully (unless otherwise directed):  On the Night Before the Test: . Be sure to Drink plenty of water. . Do not consume any  caffeinated/decaffeinated beverages or chocolate 12 hours prior to your test. . Do not take any antihistamines 12 hours prior to your test.  On the Day of the Test: . Drink plenty of water until 1 hour prior to the test. . Do not eat any food 4 hours prior to the test. . You may take your regular medications prior to the test.  . Take metoprolol (Lopressor) 50mg  (1 TABLET) two hours prior to test. . HOLD Furosemide/Hydrochlorothiazide morning of the test. . FEMALES- please wear underwire-free bra if available  After the Test: . Drink plenty of water. . After receiving IV contrast, you may experience a mild flushed feeling. This is normal. . On occasion, you may experience a mild rash up to 24 hours after the test. This is not dangerous. If this occurs, you can take Benadryl 25 mg and increase your fluid intake. . If you experience trouble breathing, this can be serious. If it is severe call 911 IMMEDIATELY. If it is mild, please call our office. . If you take any of these medications: Glipizide/Metformin, Avandament, Glucavance, please do not take 48 hours after completing test unless otherwise instructed.  Once we have confirmed authorization from your insurance company, we will call you to set up a date and time for your test. Based on how quickly your insurance processes prior authorizations requests, please allow up to 4 weeks to be contacted for scheduling your Cardiac CT appointment. Be advised that routine Cardiac CT appointments could be scheduled as many as 8 weeks after your provider has ordered it.  For non-scheduling related questions, please contact the cardiac imaging nurse navigator should you have any questions/concerns: Marchia Bond, Cardiac Imaging Nurse Navigator Gordy Clement, Cardiac Imaging Nurse Navigator [facility] Direct Office Dial: [phone]   For scheduling needs, including cancellations and rescheduling, please call [place],  [phone].

## 2021-01-26 ENCOUNTER — Other Ambulatory Visit: Payer: Self-pay

## 2021-01-28 ENCOUNTER — Ambulatory Visit (INDEPENDENT_AMBULATORY_CARE_PROVIDER_SITE_OTHER): Payer: BLUE CROSS/BLUE SHIELD

## 2021-01-28 ENCOUNTER — Other Ambulatory Visit: Payer: Self-pay

## 2021-01-28 DIAGNOSIS — R002 Palpitations: Secondary | ICD-10-CM

## 2021-01-28 DIAGNOSIS — R072 Precordial pain: Secondary | ICD-10-CM | POA: Diagnosis not present

## 2021-01-29 ENCOUNTER — Encounter (HOSPITAL_COMMUNITY): Payer: Self-pay

## 2021-01-29 ENCOUNTER — Telehealth (HOSPITAL_COMMUNITY): Payer: Self-pay | Admitting: *Deleted

## 2021-01-29 NOTE — Telephone Encounter (Signed)
Reaching out to patient to offer assistance regarding upcoming cardiac imaging study; pt verbalizes understanding of appt date/time, parking situation and where to check in, pre-test NPO status and medications ordered, and verified current allergies; name and call back number provided for further questions should they arise  Gordy Clement RN Navigator [facility] [place] [facility] [phone] office [phone] cell  MyChart message sent to pt per pt request.

## 2021-02-01 ENCOUNTER — Other Ambulatory Visit: Payer: Self-pay

## 2021-02-01 ENCOUNTER — Encounter (HOSPITAL_BASED_OUTPATIENT_CLINIC_OR_DEPARTMENT_OTHER): Payer: Self-pay

## 2021-02-01 ENCOUNTER — Ambulatory Visit (HOSPITAL_BASED_OUTPATIENT_CLINIC_OR_DEPARTMENT_OTHER): Payer: BLUE CROSS/BLUE SHIELD

## 2021-02-01 ENCOUNTER — Ambulatory Visit (HOSPITAL_BASED_OUTPATIENT_CLINIC_OR_DEPARTMENT_OTHER)
Admission: RE | Admit: 2021-02-01 | Discharge: 2021-02-01 | Disposition: A | Discharge status: Home / Self Care | Payer: BLUE CROSS/BLUE SHIELD | Source: Ambulatory Visit | Attending: Internal Medicine | Admitting: Internal Medicine

## 2021-02-01 DIAGNOSIS — R072 Precordial pain: Secondary | ICD-10-CM | POA: Diagnosis not present

## 2021-02-01 MED ORDER — METOPROLOL TARTRATE 5 MG/5ML IV SOLN
5.0000 mg | Freq: Once | INTRAVENOUS | Status: AC
  Administered 2021-02-01: 5 mg via INTRAVENOUS

## 2021-02-01 MED ORDER — NITROGLYCERIN 0.4 MG SL SUBL
0.8000 mg | SUBLINGUAL_TABLET | SUBLINGUAL | Status: DC | PRN
  Administered 2021-02-01: 0.8 mg via SUBLINGUAL

## 2021-02-01 MED ORDER — IOHEXOL 350 MG/ML SOLN
95.0000 mL | Freq: Once | INTRAVENOUS | Status: AC | PRN
  Administered 2021-02-01: 95 mL via INTRAVENOUS

## 2021-02-01 NOTE — Progress Notes (Signed)
Pt presents for cardiac CT. Pt tolerated scan and medications without incident.  Pt denies any symptoms after the scan and was escorted by staff out of the department.

## 2021-02-03 ENCOUNTER — Telehealth: Payer: Self-pay | Admitting: Internal Medicine

## 2021-02-03 ENCOUNTER — Ambulatory Visit: Payer: BLUE CROSS/BLUE SHIELD | Admitting: General Practice

## 2021-02-03 NOTE — Telephone Encounter (Signed)
Pt and daughter called questioning whether pt still need ECHO since CT was normal and pt is currently wearing a monitor. Nurse advise pt that CT looks for calcification and blockages, monitor looks at the electrical rhythm or pt's heart, and ECHO looks at the structure and pumping functions.   Pt verbalized understanding and state she will keep appointment for ECHO.

## 2021-02-03 NOTE — Telephone Encounter (Signed)
Patient and patient's daughter called to see if the test that done on the 5/3 is needed. They are wonder with the CT Scan and since the patient is wearing the heart monitor for 30 days if the echo test is needed. Please advise

## 2021-02-23 ENCOUNTER — Ambulatory Visit (HOSPITAL_COMMUNITY): Payer: BLUE CROSS/BLUE SHIELD | Attending: Cardiovascular Disease

## 2021-02-23 ENCOUNTER — Other Ambulatory Visit: Payer: Self-pay

## 2021-02-23 DIAGNOSIS — R072 Precordial pain: Secondary | ICD-10-CM | POA: Diagnosis not present

## 2021-02-23 DIAGNOSIS — R002 Palpitations: Secondary | ICD-10-CM | POA: Diagnosis not present

## 2021-02-23 LAB — ECHOCARDIOGRAM COMPLETE
Area-P 1/2: 4.36 cm2
MV M vel: 5.09 m/s
MV Peak grad: 103.4 mmHg
S' Lateral: 2.9 cm

## 2021-03-10 ENCOUNTER — Other Ambulatory Visit: Payer: Self-pay

## 2021-03-10 ENCOUNTER — Ambulatory Visit: Payer: BLUE CROSS/BLUE SHIELD | Attending: Family Medicine | Admitting: Family Medicine

## 2021-03-10 ENCOUNTER — Encounter: Payer: Self-pay | Admitting: Family Medicine

## 2021-03-10 VITALS — BP 107/69 | HR 79 | Ht 66.0 in | Wt 222.2 lb

## 2021-03-10 DIAGNOSIS — D1779 Benign lipomatous neoplasm of other sites: Secondary | ICD-10-CM

## 2021-03-10 DIAGNOSIS — R140 Abdominal distension (gaseous): Secondary | ICD-10-CM | POA: Diagnosis not present

## 2021-03-10 DIAGNOSIS — M940 Chondrocostal junction syndrome [Tietze]: Secondary | ICD-10-CM | POA: Diagnosis not present

## 2021-03-10 DIAGNOSIS — G8929 Other chronic pain: Secondary | ICD-10-CM

## 2021-03-10 DIAGNOSIS — M5441 Lumbago with sciatica, right side: Secondary | ICD-10-CM | POA: Diagnosis not present

## 2021-03-10 MED ORDER — METHOCARBAMOL 750 MG PO TABS
750.0000 mg | ORAL_TABLET | Freq: Three times a day (TID) | ORAL | 1 refills | Status: DC | PRN
  Filled 2021-03-10: qty 90, 30d supply, fill #0

## 2021-03-10 NOTE — Patient Instructions (Signed)
Costochondritis  Costochondritis is irritation and swelling (inflammation) of the tissue that connects the ribs to the breastbone (sternum). This tissue is called cartilage. Costochondritis causes pain in the front of the chest. Usually, the pain:  Starts slowly.  Is in more than one rib. What are the causes? The exact cause of this condition is not always known. It results from stress on the tissue in the affected area. The cause of this stress could be:  Chest injury.  Exercise or activity, such as lifting.  Very bad coughing. What increases the risk? You are more likely to develop this condition if you:  Are female.  Are 30-40 years old.  Recently started a new exercise or work activity.  Have low levels of vitamin D.  Have a condition that makes you cough often. What are the signs or symptoms? The main symptom of this condition is chest pain. The pain:  Usually starts slowly and can be sharp or dull.  Gets worse with deep breathing, coughing, or exercise.  Gets better with rest.  May be worse when you press on the affected area of your ribs and breastbone. How is this treated? This condition usually goes away on its own over time. Your doctor may prescribe an NSAID, such as ibuprofen. This can help reduce pain and inflammation. Treatment may also include:  Resting and avoiding activities that make pain worse.  Putting heat or ice on the painful area.  Doing exercises to stretch your chest muscles. If these treatments do not help, your doctor may inject a numbing medicine to help relieve the pain. Follow these instructions at home: Managing pain, stiffness, and swelling  If told, put ice on the painful area. To do this: ? Put ice in a plastic bag. ? Place a towel between your skin and the bag. ? Leave the ice on for 20 minutes, 2-3 times a day.  If told, put heat on the affected area. Do this as often as told by your doctor. Use the heat source that your  doctor recommends, such as a moist heat pack or a heating pad. ? Place a towel between your skin and the heat source. ? Leave the heat on for 20-30 minutes. ? Take off the heat if your skin turns bright red. This is very important if you cannot feel pain, heat, or cold. You may have a greater risk of getting burned.      Activity  Rest as told by your doctor.  Do not do anything that makes your pain worse. This includes any activities that use chest, belly (abdomen), and side muscles.  Do not lift anything that is heavier than 10 lb (4.5 kg), or the limit that you are told, until your doctor says that it is safe.  Return to your normal activities as told by your doctor. Ask your doctor what activities are safe for you. General instructions  Take over-the-counter and prescription medicines only as told by your doctor.  Keep all follow-up visits as told by your doctor. This is important. Contact a doctor if:  You have chills or a fever.  Your pain does not go away or it gets worse.  You have a cough that does not go away. Get help right away if:  You are short of breath.  You have very bad chest pain that is not helped by medicines, heat, or ice. These symptoms may be an emergency. Do not wait to see if the symptoms will go away. Get   medical help right away. Call your local emergency services (911 in the [place] Do not drive yourself to the hospital. Summary  Costochondritis is irritation and swelling (inflammation) of the tissue that connects the ribs to the breastbone (sternum).  This condition causes pain in the front of the chest.  Treatment may include medicines, rest, heat or ice, and exercises. This information is not intended to replace advice given to you by your health care provider. Make sure you discuss any questions you have with your health care provider. Document Revised: 08/23/2019 Document Reviewed: 08/23/2019 Elsevier Patient Education  2021 Elsevier Inc.  

## 2021-03-10 NOTE — Progress Notes (Signed)
Subjective:  Patient ID: Megan Allen, female    DOB: Feb 23, 1956  Age: 65 y.o. MRN: [id number]  CC: Gas   HPI Megan Allen is a 65 year old female with a history of chronic low back pain here with acute concerns.  Interval History: Still has lumbar back pain which radiates down her RLE and is undergoing PT. Followed by Raliegh Ip. She uses a muscle relaxer and Tylenol for her symptoms. She has bloating in her stomach when she eats and it radiates to her chest. Uses Gas X with mild relief.  In 08/2018 she tested positive for H. pylori but in 04/2020 she tested negative. Brocolli, Onions and Peppers worsen her symptoms.  Denies presence of abdominal pain, nausea, vomiting, diarrhea, constipation. She was evaluated by Cardiology due to chest pains and 30-day cardiac event monitor was unrevealing. Echocardiogram revealed EF of 60 to 65%, moderate MR, no regional wall motion abnormality. She would like me to check her back as a chiropractor had ordered an x-ray and thought she did have a soft fatty tissue in her back which is not tender.  Past Medical History:  Diagnosis Date  . Chronic back pain   . Headache   . Murmur   . Muscle spasm     Past Surgical History:  Procedure Laterality Date  . ABDOMINAL HYSTERECTOMY     partial  . BREAST CYST ASPIRATION    . COLONOSCOPY WITH PROPOFOL N/A 10/12/2020   Procedure: COLONOSCOPY WITH PROPOFOL;  Surgeon: Eloise Harman, DO;  Location: [facility];  Service: Endoscopy;  Laterality: N/A;  12:45  . POLYPECTOMY  10/12/2020   Procedure: POLYPECTOMY;  Surgeon: Eloise Harman, DO;  Location: [facility];  Service: Endoscopy;;    Family History  Problem Relation Age of Onset  . Hyperlipidemia Mother   . Diabetes Father   . Diabetes Brother   . Heart disease Daughter   . Stroke Maternal Grandfather     Allergies  Allergen Reactions  . Bee Venom   . Caffeine     Chest pain, heart racing    Outpatient  Medications Prior to Visit  Medication Sig Dispense Refill  . acetaminophen (TYLENOL) 650 MG CR tablet Take 650 mg by mouth as needed for pain.    . Ascorbic Acid (VITAMIN C) 1000 MG tablet Take 1,000 mg by mouth daily.    Marland Kitchen aspirin EC 81 MG tablet Take 81 mg by mouth daily.    . bisacodyl (DULCOLAX) 5 MG EC tablet Take 5 mg by mouth daily as needed for moderate constipation.    . Calcium Carb-Cholecalciferol 600-800 MG-UNIT TABS Take 1 tablet by mouth daily.    . Cholecalciferol (VITAMIN D3 PO) Take 2,000 Units by mouth daily.    . Cyanocobalamin (VITAMIN B-12 PO) Take 3,000 mcg by mouth daily.    . Diclofenac Sodium 2 % SOLN Place onto the skin daily.    Marland Kitchen ECHINACEA PO Take 400 mg by mouth as needed.    Marland Kitchen EPINEPHrine 0.3 mg/0.3 mL IJ SOAJ injection Inject 0.3 mLs (0.3 mg total) into the muscle as needed for anaphylaxis (severe allergic reactions from bee or other insect stings resulting in shortness of breath). 1 each 0  . famotidine (PEPCID) 40 MG tablet Take 1 tablet (40 mg total) by mouth daily. For five days. 5 tablet 0  . gabapentin (NEURONTIN) 300 MG capsule Take 1 capsule (300 mg total) by mouth at bedtime. (Patient taking differently: Take 300 mg by mouth  as needed.) 60 capsule 3  . meclizine (ANTIVERT) 12.5 MG tablet Take 1 tablet (12.5 mg total) by mouth 3 (three) times daily as needed for dizziness. (Patient taking differently: Take 12.5 mg by mouth as needed for dizziness.) 10 tablet 0  . methocarbamol (ROBAXIN) 750 MG tablet Take 1 tablet (750 mg total) by mouth every 8 (eight) hours as needed for muscle spasms. (Patient taking differently: Take 750 mg by mouth as needed for muscle spasms.) 90 tablet 1  . Multiple Vitamin (MULTIVITAMIN) capsule Take 1 capsule by mouth daily.    . Na Sulfate-K Sulfate-Mg Sulf 17.5-3.13-1.6 GM/177ML SOLN TAKE AS DIRECTED 354 mL 0  . omeprazole (PRILOSEC) 20 MG capsule Take 1 capsule (20 mg total) by mouth 2 (two) times daily before a meal. (Patient  taking differently: Take 20 mg by mouth as needed.) 28 capsule 0  . SUMAtriptan (IMITREX) 50 MG tablet Take 1 tab at onset of migraine.  May repeat in 2 hrs, if needed.  Max dose: 2 tabs/day. This is a 30 day prescription. (Patient taking differently: as needed. Take 1 tab at onset of migraine.  May repeat in 2 hrs, if needed.  Max dose: 2 tabs/day. This is a 30 day prescription.) 12 tablet 6  . UNABLE TO FIND Take 1 tablet by mouth as needed. OTC Calms for simple nervous tension and occasional sleeplessness.    . metoprolol tartrate (LOPRESSOR) 50 MG tablet Take 1 tablet (50 mg total) by mouth once for 1 dose. PLEASE TAKE METOPROLOL 2  HOURS PRIOR TO CTA SCAN. 1 tablet 0   No facility-administered medications prior to visit.     ROS Review of Systems  Constitutional: Negative for activity change, appetite change and fatigue.  HENT: Negative for congestion, sinus pressure and sore throat.   Eyes: Negative for visual disturbance.  Respiratory: Negative for cough, chest tightness, shortness of breath and wheezing.   Cardiovascular: Negative for chest pain and palpitations.  Gastrointestinal: Negative for abdominal distention, abdominal pain and constipation.  Endocrine: Negative for polydipsia.  Genitourinary: Negative for dysuria and frequency.  Musculoskeletal: Positive for back pain. Negative for arthralgias.  Skin: Negative for rash.  Neurological: Negative for tremors, light-headedness and numbness.  Hematological: Does not bruise/bleed easily.  Psychiatric/Behavioral: Negative for agitation and behavioral problems.    Objective:  BP 107/69   Pulse 79   Ht 5\' 6"  (1.676 m)   Wt 222 lb 3.2 oz (100.8 kg)   SpO2 99%   BMI 35.86 kg/m   BP/Weight 03/10/2021 10/29/2692 05/28/4626  Systolic BP 035 99 009  Diastolic BP 69 63 72  Wt. (Lbs) 222.2 - 206  BMI 35.86 - 33.25      Physical Exam Constitutional:      Appearance: She is well-developed.  Neck:     Vascular: No JVD.   Cardiovascular:     Rate and Rhythm: Normal rate.     Heart sounds: Normal heart sounds. No murmur heard.     Comments: Reproducible chest pain Pulmonary:     Effort: Pulmonary effort is normal.     Breath sounds: Normal breath sounds. No wheezing or rales.  Chest:     Chest wall: No tenderness.  Abdominal:     General: Bowel sounds are normal. There is no distension.     Palpations: Abdomen is soft. There is no mass.     Tenderness: There is no abdominal tenderness.  Musculoskeletal:        General: Normal range of motion.  Right lower leg: No edema.     Left lower leg: No edema.  Skin:    Comments: Soft swelling on right lateral aspect of thoracic spine, nontender  Neurological:     Mental Status: She is alert and oriented to person, place, and time.  Psychiatric:        Mood and Affect: Mood normal.     CMP Latest Ref Rng & Units 01/11/2021 05/03/2020 11/28/2017  Glucose 70 - 99 mg/dL 90 128(H) 77  BUN 8 - 23 mg/dL 14 28(H) 13  Creatinine 0.44 - 1.00 mg/dL 0.77 0.79 0.75  Sodium 135 - 145 mmol/L 138 137 142  Potassium 3.5 - 5.1 mmol/L 3.7 4.2 4.0  Chloride 98 - 111 mmol/L 104 102 100  CO2 22 - 32 mmol/L 25 25 26   Calcium 8.9 - 10.3 mg/dL 9.2 8.9 9.9  Total Protein 6.5 - 8.1 g/dL - 6.7 6.9  Total Bilirubin 0.3 - 1.2 mg/dL - 0.2(L) 0.4  Alkaline Phos 38 - 126 U/L - 63 72  AST 15 - 41 U/L - 18 16  ALT 0 - 44 U/L - 18 18    Lipid Panel     Component Value Date/Time   CHOL 194 06/11/2020 1041   TRIG 58 06/11/2020 1041   HDL 61 06/11/2020 1041   CHOLHDL 3.2 06/11/2020 1041   CHOLHDL 3.5 09/13/2016 1202   VLDL 12 09/13/2016 1202   LDLCALC 122 (H) 06/11/2020 1041    CBC    Component Value Date/Time   WBC 6.9 01/11/2021 1628   RBC 4.39 01/11/2021 1628   HGB 13.6 01/11/2021 1628   HCT 40.7 01/11/2021 1628   PLT 188 01/11/2021 1628   MCV 92.7 01/11/2021 1628   MCH 31.0 01/11/2021 1628   MCHC 33.4 01/11/2021 1628   RDW 13.3 01/11/2021 1628   LYMPHSABS 1,960  09/13/2016 1202   MONOABS 448 09/13/2016 1202   EOSABS 56 09/13/2016 1202   BASOSABS 0 09/13/2016 1202    Lab Results  Component Value Date   HGBA1C 5.6 05/12/2020    Assessment & Plan:  .1. Chronic right-sided low back pain with right-sided sciatica Uncontrolled Currently followed by orthopedics Undergoing PT - methocarbamol (ROBAXIN) 750 MG tablet; Take 1 tablet (750 mg total) by mouth every 8 (eight) hours as needed for muscle spasms.  Dispense: 90 tablet; Refill: 1  2. Costochondritis Discussed pathophysiology of costochondritis Advised to use NSAIDs  3. Abdominal bloating Chronic to evaluate for H. pylori - H. pylori breath test  4. Lipoma of other specified sites Patient reassured  No orders of the defined types were placed in this encounter.   Follow-up: Return in about 1 month (around 04/10/2021) for medicare wellness exam.       [name], MD, FAAFP. [facility] and [place], [place]   03/10/2021, 4:05 PM

## 2021-03-24 ENCOUNTER — Other Ambulatory Visit: Payer: Self-pay

## 2021-03-24 ENCOUNTER — Ambulatory Visit: Payer: BLUE CROSS/BLUE SHIELD | Attending: Family Medicine

## 2021-03-25 LAB — H. PYLORI BREATH TEST: H pylori Breath Test: NEGATIVE

## 2021-03-26 ENCOUNTER — Telehealth: Payer: Self-pay

## 2021-03-26 NOTE — Telephone Encounter (Signed)
-----   Message from [name], MD sent at 03/26/2021 10:28 AM EDT ----- Please inform her that her H. pylori breath test is negative.  Abdominal bloating could be related to certain meals which she might need to explore and try to avoid.  OTC Gas-X will also be beneficial.

## 2021-03-26 NOTE — Telephone Encounter (Signed)
Patient name and DOB has been verified Patient was informed of lab results. Patient had no questions.  

## 2021-03-30 ENCOUNTER — Ambulatory Visit: Payer: BLUE CROSS/BLUE SHIELD | Admitting: Internal Medicine

## 2021-04-06 ENCOUNTER — Other Ambulatory Visit: Payer: Self-pay

## 2021-04-06 ENCOUNTER — Ambulatory Visit: Payer: BLUE CROSS/BLUE SHIELD | Admitting: Internal Medicine

## 2021-04-06 ENCOUNTER — Encounter: Payer: Self-pay | Admitting: Internal Medicine

## 2021-04-06 VITALS — BP 128/80 | HR 67 | Ht 66.5 in | Wt 204.0 lb

## 2021-04-06 DIAGNOSIS — E785 Hyperlipidemia, unspecified: Secondary | ICD-10-CM | POA: Diagnosis not present

## 2021-04-06 DIAGNOSIS — R072 Precordial pain: Secondary | ICD-10-CM

## 2021-04-06 DIAGNOSIS — R002 Palpitations: Secondary | ICD-10-CM | POA: Diagnosis not present

## 2021-04-06 DIAGNOSIS — I340 Nonrheumatic mitral (valve) insufficiency: Secondary | ICD-10-CM

## 2021-04-06 NOTE — Progress Notes (Signed)
Cardiology Office Note:    Date:  04/06/2021   ID:  Megan Allen, DOB Aug 07, 1956, MRN [id number]  PCP:  Megan Rakes, MD  Cardiologist:  None  Electrophysiologist:  None   Referring MD: Megan Rakes, MD   Chief Complaint/Reason for Referral: Chest pain  History of Present Illness:    Megan Allen is a 65 y.o. female with a history of HLD who presents for follow up evaluation of chest pain. Presents with her daughter Megan Allen who provides additional history.  She describes her symptoms as a sensation in chest - feels like trapped gas. She tells me she had a heart murmur as child. Chest sensation - with caffeine or energy bar, feels like she has chest pain. Can happen at rest. Chest pain for 48 hr - March 21st, went to ED and ruled out for MI. No SOB but doesn't push herself to know if she has exertional symptoms. Left sided chest pain, nonreproducible with palpitation. Occurs with palpitations and without. Takes a baby aspirin. She is concerned about palpitations since her daughter has afib (secondary to valvular heart disease). Symptoms are weekly to monthly, seconds to minutes.  Snores - not been screened for OSA.  Fhx: brother died in 40 of possible seizure. Daughter had rheumatic fever with MVR.   04/06/21: We reviewed results of echo, CCTA and monitor.  CCTA showed no CAD, and no coronary calcification. This is reassuring in setting of chest pain. Monitor for palpitations showed no pauses, afib, VT, SVT or AV heart block. Infrequent ectopy noted. Symptoms associated with SR.  Echo showed eccentric posteriorly direct moderate MR and no mitral valve stenosis. Probable restricted PMVL. Normal biventricular function, normal RVSP and no other valvular heart disease.  She is feeling well overall and reassured by these results.     Past Medical History:  Diagnosis Date   Atypical chest pain 05/06/2021   Chronic back pain    Headache    Moderate mitral  regurgitation 05/06/2021   Murmur    Muscle spasm    Obesity (BMI 30.0-34.9) 05/06/2021   Pure hypercholesterolemia 05/06/2021    Past Surgical History:  Procedure Laterality Date   ABDOMINAL HYSTERECTOMY     partial   BREAST CYST ASPIRATION     COLONOSCOPY WITH PROPOFOL N/A 10/12/2020   Procedure: COLONOSCOPY WITH PROPOFOL;  Surgeon: Megan Harman, DO;  Location: [facility];  Service: Endoscopy;  Laterality: N/A;  12:45   POLYPECTOMY  10/12/2020   Procedure: POLYPECTOMY;  Surgeon: Megan Harman, DO;  Location: [facility];  Service: Endoscopy;;    Current Medications: Current Meds  Medication Sig   acetaminophen (TYLENOL) 650 MG CR tablet Take 650 mg by mouth as needed for pain.   Ascorbic Acid (VITAMIN C) 1000 MG tablet Take 1,000 mg by mouth daily.   bisacodyl (DULCOLAX) 5 MG EC tablet Take 5 mg by mouth daily as needed for moderate constipation.   Calcium Carb-Cholecalciferol 600-800 MG-UNIT TABS Take 1 tablet by mouth daily.   Cholecalciferol (VITAMIN D3 PO) Take 2,000 Units by mouth daily.   Cyanocobalamin (VITAMIN B-12 PO) Take 3,000 mcg by mouth daily.   Diclofenac Sodium 2 % SOLN Place onto the skin daily.   ECHINACEA PO Take 400 mg by mouth as needed.   EPINEPHrine 0.3 mg/0.3 mL IJ SOAJ injection Inject 0.3 mLs (0.3 mg total) into the muscle as needed for anaphylaxis (severe allergic reactions from bee or other insect stings resulting in shortness of breath).  famotidine (PEPCID) 40 MG tablet Take 1 tablet (40 mg total) by mouth daily. For five days.   meclizine (ANTIVERT) 12.5 MG tablet Take 1 tablet (12.5 mg total) by mouth 3 (three) times daily as needed for dizziness. (Patient taking differently: Take 12.5 mg by mouth as needed for dizziness.)   methocarbamol (ROBAXIN) 750 MG tablet Take 1 tablet (750 mg total) by mouth every 8 (eight) hours as needed for muscle spasms.   Multiple Vitamin (MULTIVITAMIN) capsule Take 1 capsule by mouth daily.   Na Sulfate-K  Sulfate-Mg Sulf 17.5-3.13-1.6 GM/177ML SOLN TAKE AS DIRECTED   omeprazole (PRILOSEC) 20 MG capsule Take 1 capsule (20 mg total) by mouth 2 (two) times daily before a meal. (Patient taking differently: Take 20 mg by mouth as needed.)   SUMAtriptan (IMITREX) 50 MG tablet Take 1 tab at onset of migraine.  May repeat in 2 hrs, if needed.  Max dose: 2 tabs/day. This is a 30 day prescription. (Patient taking differently: as needed. Take 1 tab at onset of migraine.  May repeat in 2 hrs, if needed.  Max dose: 2 tabs/day. This is a 30 day prescription.)   UNABLE TO FIND Take 1 tablet by mouth as needed. OTC Calms for simple nervous tension and occasional sleeplessness.   [DISCONTINUED] aspirin EC 81 MG tablet Take 81 mg by mouth daily.     Allergies:   Bee venom and Caffeine   Social History   Tobacco Use   Smoking status: Never   Smokeless tobacco: Never  Vaping Use   Vaping Use: Never used  Substance Use Topics   Alcohol use: No   Drug use: No     Family History: The patient's family history includes Diabetes in her brother and father; Heart disease in her daughter; Hyperlipidemia in her mother; Stroke in her maternal grandfather.  ROS:   Please see the history of present illness.    All other systems reviewed and are negative.  EKGs/Labs/Other Studies Reviewed:    The following studies were reviewed today:  EKG:  NSR, possible LAE  Recent Labs: 01/11/2021: BUN 14; Creatinine, Ser 0.77; Hemoglobin 13.6; Platelets 188; Potassium 3.7; Sodium 138  Recent Lipid Panel    Component Value Date/Time   CHOL 194 06/11/2020 1041   TRIG 58 06/11/2020 1041   HDL 61 06/11/2020 1041   CHOLHDL 3.2 06/11/2020 1041   CHOLHDL 3.5 09/13/2016 1202   VLDL 12 09/13/2016 1202   LDLCALC 122 (H) 06/11/2020 1041    Physical Exam:    VS:  BP 128/80   Pulse 67   Ht 5' 6.5" (1.689 m)   Wt 204 lb (92.5 kg)   SpO2 97%   BMI 32.43 kg/m     Wt Readings from Last 5 Encounters:  05/06/21 199 lb 9.6 oz  (90.5 kg)  04/06/21 204 lb (92.5 kg)  03/10/21 222 lb 3.2 oz (100.8 kg)  01/25/21 206 lb (93.4 kg)  01/11/21 209 lb 14.1 oz (95.2 kg)    Constitutional: No acute distress Eyes: sclera non-icteric, normal conjunctiva and lids ENMT: normal dentition, moist mucous membranes Cardiovascular: regular rhythm, normal rate, 2/6 SM apex. S1 and S2 normal. Radial pulses normal bilaterally. No jugular venous distention.  Respiratory: clear to auscultation bilaterally GI : normal bowel sounds, soft and nontender. No distention.   MSK: extremities warm, well perfused. No edema.  NEURO: grossly nonfocal exam, moves all extremities. PSYCH: alert and oriented x 3, normal mood and affect.   ASSESSMENT:  1. Mitral valve insufficiency, unspecified etiology   2. Moderate mitral regurgitation   3. Palpitations   4. Precordial pain   5. Hyperlipidemia, unspecified hyperlipidemia type    PLAN:    Palpitations - no worrisome findings on monitor. Continue to observe. Not currently on AV nodal blocking agents.  Precordial pain -  MV regurgitation - - no coronary artery disease. Observe for now. May be secondary to increased LV load from MR but MR is not severe at this time by echo report, will monitor closely. Repeat echo in 1 year or sooner with worsening symptoms.  Hyperlipidemia, unspecified hyperlipidemia type - LDL 121, not currently on lipid lowering therapy. No coronary calcifications. Discussed diet and lifestyle modification for LDL lowering. Exercise recommendations: Goal of exercising for at least 30 minutes a day, at least 5 times per week.  Please exercise to a moderate exertion.  This means that while exercising it is difficult to speak in full sentences, however you are not so short of breath that you feel you must stop, and not so comfortable that you can carry on a full conversation.  Exertion level should be approximately a 5/10, if 10 is the most exertion you can perform.  Diet  recommendations: Recommend a heart healthy diet such as the Mediterranean diet.  This diet consists of plant based foods, healthy fats, lean meats, olive oil.  It suggests limiting the intake of simple carbohydrates such as white breads, pastries, and pastas.  It also limits the amount of red meat, wine, and dairy products such as cheese that one should consume on a daily basis.    Cherlynn Kaiser, MD, [facility]    Medication Adjustments/Labs and Tests Ordered: Current medicines are reviewed at length with the patient today.  Concerns regarding medicines are outlined above.   Orders Placed This Encounter  Procedures   ECHOCARDIOGRAM COMPLETE    No orders of the defined types were placed in this encounter.   Patient Instructions  Medication Instructions:  Your Physician recommend you continue on your current medication as directed.    *If you need a refill on your cardiac medications before your next appointment, please call your pharmacy*   Lab Work: None ordered today   Testing/Procedures: Your physician has requested that you have an echocardiogram in 1 year. Echocardiography is a painless test that uses sound waves to create images of your heart. It provides your doctor with information about the size and shape of your heart and how well your heart's chambers and valves are working. This procedure takes approximately one hour. There are no restrictions for this procedure. [place] [address]    Follow-Up: At [facility], you and your health needs are our priority.  As part of our continuing mission to provide you with exceptional heart care, we have created designated Provider Care Teams.  These Care Teams include your primary Cardiologist (physician) and Advanced Practice Providers (APPs -  Physician Assistants and Nurse Practitioners) who all work together to provide you with the care you need, when you need it.  We recommend signing  up for the patient portal called "MyChart".  Sign up information is provided on this After Visit Summary.  MyChart is used to connect with patients for Virtual Visits (Telemedicine).  Patients are able to view lab/test results, encounter notes, upcoming appointments, etc.  Non-urgent messages can be sent to your provider as well.   To learn more about what you can do  with MyChart, go to [email or web address].    Your next appointment:   1 year(s)  The format for your next appointment:   In Person  Provider:   Cherlynn Kaiser, MD

## 2021-04-06 NOTE — Patient Instructions (Signed)
Medication Instructions:  Your Physician recommend you continue on your current medication as directed.    *If you need a refill on your cardiac medications before your next appointment, please call your pharmacy*   Lab Work: None ordered today   Testing/Procedures: Your physician has requested that you have an echocardiogram in 1 year. Echocardiography is a painless test that uses sound waves to create images of your heart. It provides your doctor with information about the size and shape of your heart and how well your heart's chambers and valves are working. This procedure takes approximately one hour. There are no restrictions for this procedure. [place] [address]    Follow-Up: At [facility], you and your health needs are our priority.  As part of our continuing mission to provide you with exceptional heart care, we have created designated Provider Care Teams.  These Care Teams include your primary Cardiologist (physician) and Advanced Practice Providers (APPs -  Physician Assistants and Nurse Practitioners) who all work together to provide you with the care you need, when you need it.  We recommend signing up for the patient portal called "MyChart".  Sign up information is provided on this After Visit Summary.  MyChart is used to connect with patients for Virtual Visits (Telemedicine).  Patients are able to view lab/test results, encounter notes, upcoming appointments, etc.  Non-urgent messages can be sent to your provider as well.   To learn more about what you can do with MyChart, go to [email or web address].    Your next appointment:   1 year(s)  The format for your next appointment:   In Person  Provider:   Cherlynn Kaiser, MD

## 2021-05-06 ENCOUNTER — Other Ambulatory Visit: Payer: Self-pay

## 2021-05-06 ENCOUNTER — Encounter (HOSPITAL_BASED_OUTPATIENT_CLINIC_OR_DEPARTMENT_OTHER): Payer: Self-pay | Admitting: Cardiovascular Disease

## 2021-05-06 ENCOUNTER — Ambulatory Visit (HOSPITAL_BASED_OUTPATIENT_CLINIC_OR_DEPARTMENT_OTHER): Payer: BLUE CROSS/BLUE SHIELD | Admitting: Cardiovascular Disease

## 2021-05-06 DIAGNOSIS — E66811 Obesity, class 1: Secondary | ICD-10-CM

## 2021-05-06 DIAGNOSIS — R0789 Other chest pain: Secondary | ICD-10-CM | POA: Insufficient documentation

## 2021-05-06 DIAGNOSIS — E7800 Pure hypercholesterolemia, unspecified: Secondary | ICD-10-CM

## 2021-05-06 DIAGNOSIS — E669 Obesity, unspecified: Secondary | ICD-10-CM | POA: Diagnosis not present

## 2021-05-06 DIAGNOSIS — I340 Nonrheumatic mitral (valve) insufficiency: Secondary | ICD-10-CM

## 2021-05-06 HISTORY — DX: Nonrheumatic mitral (valve) insufficiency: I34.0

## 2021-05-06 HISTORY — DX: Other chest pain: R07.89

## 2021-05-06 HISTORY — DX: Obesity, unspecified: E66.9

## 2021-05-06 HISTORY — DX: Pure hypercholesterolemia, unspecified: E78.00

## 2021-05-06 HISTORY — DX: Obesity, class 1: E66.811

## 2021-05-06 NOTE — Assessment & Plan Note (Signed)
Her chest pain is atypical and never occurs with exertion.  I suspect it is related to GERD.  Continue famotidine and omeprazole.  Coronary CT was negative for CAD.

## 2021-05-06 NOTE — Assessment & Plan Note (Signed)
ASCVD 10-year risk is 5.4%.  Therefore we will stop her aspirin.  She had no CAD on her coronary CT-A.

## 2021-05-06 NOTE — Assessment & Plan Note (Signed)
Moderate mitral regurgitation on echo 02/2021.  She has no heart failure symptoms.  We will plan for a repeat echo in a year.

## 2021-05-06 NOTE — Patient Instructions (Signed)
Medication Instructions:  STOP ASPIRIN   *If you need a refill on your cardiac medications before your next appointment, please call your pharmacy*  Lab Work: NONE  Testing/Procedures: Your physician has requested that you have an echocardiogram. Echocardiography is a painless test that uses sound waves to create images of your heart. It provides your doctor with information about the size and shape of your heart and how well your heart's chambers and valves are working. This procedure takes approximately one hour. There are no restrictions for this procedure.  TO BE DONE IN MAY 2023   Follow-Up: At [facility], you and your health needs are our priority.  As part of our continuing mission to provide you with exceptional heart care, we have created designated Provider Care Teams.  These Care Teams include your primary Cardiologist (physician) and Advanced Practice Providers (APPs -  Physician Assistants and Nurse Practitioners) who all work together to provide you with the care you need, when you need it.  We recommend signing up for the patient portal called "MyChart".  Sign up information is provided on this After Visit Summary.  MyChart is used to connect with patients for Virtual Visits (Telemedicine).  Patients are able to view lab/test results, encounter notes, upcoming appointments, etc.  Non-urgent messages can be sent to your provider as well.   To learn more about what you can do with MyChart, go to [email or web address].    Your next appointment:   10 month(s)  The format for your next appointment:   In Person  Provider:   Skeet Latch, MD or Laurann Montana, NP AFTER YOU ECHO IN MAY 2023

## 2021-05-06 NOTE — Progress Notes (Signed)
Cardiology Office Note:    Date:  05/06/2021   ID:  Melford Aase, DOB 02-25-1956, MRN [id number]  PCP:  [name], MD   [facility] Providers Cardiologist:  None     Referring MD: [name], MD   No chief complaint on file.   History of Present Illness:    Megan Allen is a 65 y.o. female with a hx of  moderate mitral regurgitation, here for follow-up. She was seen in the ED 12/2020 with chest pain. Cardiac enzymes were negative, and she was referred to cardiology as an outpatient. She saw Dr. Margaretann Loveless and was referred for a coronary CTA 01/2021 which was normal. Her calcium score was 0. She was also noted to have a murmur. She had an Echo 52022 with normal systolic function and moderate mitral regurgitation.  Today, she is accompanied by her daughter, who also provides some history. This week she has been feeling some central chest pain. However, she is unsure if her pain is related to stress, her stomach, or what she is eating. After eating certain foods she noticed worsening chest pains. For exercise, she is in physical therapy and completing those exercises. Typically she feels okay while exercising, but notes she may not be exerting herself enough. Occasionally she also walks short distances with her daughter. However, she is limited by her sciatic back pain. She was told about the benefits of sea moss, and this has been helping her pain management. Currently she takes aspirin because she was told her cholesterol was slightly elevated. At home her blood pressure has been stable. Her 10-year risk score is 5.6%. She denies any shortness of breath, palpitations, or exertional symptoms. No headaches, lightheadedness, or syncope to report. Also has no lower extremity edema, orthopnea or PND.   Past Medical History:  Diagnosis Date   Atypical chest pain 05/06/2021   Chronic back pain    Headache    Moderate mitral regurgitation 05/06/2021   Murmur    Muscle  spasm    Obesity (BMI 30.0-34.9) 05/06/2021   Pure hypercholesterolemia 05/06/2021    Past Surgical History:  Procedure Laterality Date   ABDOMINAL HYSTERECTOMY     partial   BREAST CYST ASPIRATION     COLONOSCOPY WITH PROPOFOL N/A 10/12/2020   Procedure: COLONOSCOPY WITH PROPOFOL;  Surgeon: Eloise Harman, DO;  [facility];  Service: Endoscopy;  Laterality: N/A;  12:45   POLYPECTOMY  10/12/2020   Procedure: POLYPECTOMY;  Surgeon: Eloise Harman, DO;  [facility];  Service: Endoscopy;;    Current Medications: Current Meds  Medication Sig   acetaminophen (TYLENOL) 650 MG CR tablet Take 650 mg by mouth as needed for pain.   Ascorbic Acid (VITAMIN C) 1000 MG tablet Take 1,000 mg by mouth daily.   bisacodyl (DULCOLAX) 5 MG EC tablet Take 5 mg by mouth daily as needed for moderate constipation.   Calcium Carb-Cholecalciferol 600-800 MG-UNIT TABS Take 1 tablet by mouth daily.   Cholecalciferol (VITAMIN D3 PO) Take 2,000 Units by mouth daily.   Cyanocobalamin (VITAMIN B-12 PO) Take 3,000 mcg by mouth daily.   Diclofenac Sodium 2 % SOLN Place onto the skin daily.   ECHINACEA PO Take 400 mg by mouth as needed.   EPINEPHrine 0.3 mg/0.3 mL IJ SOAJ injection Inject 0.3 mLs (0.3 mg total) into the muscle as needed for anaphylaxis (severe allergic reactions from bee or other insect stings resulting in shortness of breath).   famotidine (PEPCID) 40  MG tablet Take 1 tablet (40 mg total) by mouth daily. For five days.   meclizine (ANTIVERT) 12.5 MG tablet Take 1 tablet (12.5 mg total) by mouth 3 (three) times daily as needed for dizziness. (Patient taking differently: Take 12.5 mg by mouth as needed for dizziness.)   methocarbamol (ROBAXIN) 750 MG tablet Take 1 tablet (750 mg total) by mouth every 8 (eight) hours as needed for muscle spasms.   Multiple Vitamin (MULTIVITAMIN) capsule Take 1 capsule by mouth daily.   Na Sulfate-K Sulfate-Mg Sulf 17.5-3.13-1.6 GM/177ML SOLN  TAKE AS DIRECTED   omeprazole (PRILOSEC) 20 MG capsule Take 1 capsule (20 mg total) by mouth 2 (two) times daily before a meal. (Patient taking differently: Take 20 mg by mouth as needed.)   SUMAtriptan (IMITREX) 50 MG tablet Take 1 tab at onset of migraine.  May repeat in 2 hrs, if needed.  Max dose: 2 tabs/day. This is a 30 day prescription. (Patient taking differently: as needed. Take 1 tab at onset of migraine.  May repeat in 2 hrs, if needed.  Max dose: 2 tabs/day. This is a 30 day prescription.)   UNABLE TO FIND Take 1 tablet by mouth as needed. OTC Calms for simple nervous tension and occasional sleeplessness.   [DISCONTINUED] aspirin EC 81 MG tablet Take 81 mg by mouth daily.     Allergies:   Bee venom and Caffeine   Social History   Socioeconomic History   Marital status: Married    Spouse name: Not on file   Number of children: 3   Years of education: some college   Highest education level: Not on file  Occupational History   Occupation: Hair Dresser  Tobacco Use   Smoking status: Never   Smokeless tobacco: Never  Vaping Use   Vaping Use: Never used  Substance and Sexual Activity   Alcohol use: No   Drug use: No   Sexual activity: Yes    Birth control/protection: Surgical  Other Topics Concern   Not on file  Social History Narrative   Lives at home with her husband.   Right-handed.   No caffeine per day.   Social Determinants of Health   Financial Resource Strain: Not on file  Food Insecurity: Not on file  Transportation Needs: Not on file  Physical Activity: Not on file  Stress: Not on file  Social Connections: Not on file     Family History: The patient's family history includes Diabetes in her brother and father; Heart disease in her daughter; Hyperlipidemia in her mother; Stroke in her maternal grandfather.  ROS:   Please see the history of present illness.    (+) Central chest pain (+) Abdominal pain (+) Back pain (+) Stress All other systems  reviewed and are negative.  EKGs/Labs/Other Studies Reviewed:    The following studies were reviewed today:  Echo 02/23/2021:  1. Eccentric posteriorly directed mitral regurgitation jet noted. MR is  at least moderate, but poorly visualized. Mitral inflow is A dominant and  LA size is normal. There is no overt AMVL prolapse. Pathology could be  related to restricted PMVL with  pseudo prolapse of the AMVL. The mitral valve is grossly normal. Moderate  mitral valve regurgitation. No evidence of mitral stenosis.   2. Left ventricular ejection fraction, by estimation, is 60 to 65%. The  left ventricle has normal function. The left ventricle has no regional  wall motion abnormalities. Left ventricular diastolic parameters were  normal.   3. Right ventricular  systolic function is normal. The right ventricular  size is normal. There is normal pulmonary artery systolic pressure. The  estimated right ventricular systolic pressure is 98.3 mmHg.   4. The aortic valve is tricuspid. Aortic valve regurgitation is not  visualized. No aortic stenosis is present.   5. The inferior vena cava is normal in size with greater than 50%  respiratory variability, suggesting right atrial pressure of 3 mmHg.   EKG:   05/06/2021: EKG is not ordered today.  Recent Labs: 01/11/2021: BUN 14; Creatinine, Ser 0.77; Hemoglobin 13.6; Platelets 188; Potassium 3.7; Sodium 138  Recent Lipid Panel    Component Value Date/Time   CHOL 194 06/11/2020 1041   TRIG 58 06/11/2020 1041   HDL 61 06/11/2020 1041   CHOLHDL 3.2 06/11/2020 1041   CHOLHDL 3.5 09/13/2016 1202   VLDL 12 09/13/2016 1202   LDLCALC 122 (H) 06/11/2020 1041        Physical Exam:    VS:  BP 118/72   Pulse 75   Ht 5' 6.5" (1.689 m)   Wt 199 lb 9.6 oz (90.5 kg)   SpO2 97%   BMI 31.73 kg/m     Wt Readings from Last 3 Encounters:  05/06/21 199 lb 9.6 oz (90.5 kg)  04/06/21 204 lb (92.5 kg)  03/10/21 222 lb 3.2 oz (100.8 kg)     GEN: Well  nourished, well developed in no acute distress HEENT: Normal NECK: No JVD; No carotid bruits LYMPHATICS: No lymphadenopathy CARDIAC: RRR, 2/6 systolic murmur, rubs, gallops RESPIRATORY:  Clear to auscultation without rales, wheezing or rhonchi  ABDOMEN: Soft, non-tender, non-distended MUSCULOSKELETAL:  No edema; No deformity  SKIN: Warm and dry NEUROLOGIC:  Alert and oriented x 3 PSYCHIATRIC:  Normal affect   ASSESSMENT:    1. Moderate mitral regurgitation   2. Obesity (BMI 30.0-34.9)   3. Pure hypercholesterolemia   4. Atypical chest pain    PLAN:   Moderate mitral regurgitation Moderate mitral regurgitation on echo 02/2021.  She has no heart failure symptoms.  We will plan for a repeat echo in a year.  Obesity (BMI 30.0-34.9) Patient was encouraged to increase her exercise to at least 150 minutes weekly.  Pure hypercholesterolemia ASCVD 10-year risk is 5.4%.  Therefore we will stop her aspirin.  She had no CAD on her coronary CT-A.  Atypical chest pain Her chest pain is atypical and never occurs with exertion.  I suspect it is related to GERD.  Continue famotidine and omeprazole.  Coronary CT was negative for CAD.  In order of problems listed above:  Disposition: FU with Leeandre Nordling C. Oval Linsey, MD, [facility] in 6 months.   Medication Adjustments/Labs and Tests Ordered: Current medicines are reviewed at length with the patient today.  Concerns regarding medicines are outlined above.  No orders of the defined types were placed in this encounter.  No orders of the defined types were placed in this encounter.   Patient Instructions  Medication Instructions:  STOP ASPIRIN   *If you need a refill on your cardiac medications before your next appointment, please call your pharmacy*  Lab Work: NONE  Testing/Procedures: Your physician has requested that you have an echocardiogram. Echocardiography is a painless test that uses sound waves to create images of your heart. It  provides your doctor with information about the size and shape of your heart and how well your heart's chambers and valves are working. This procedure takes approximately one hour. There are no restrictions for this procedure.  TO BE DONE IN MAY 2023   Follow-Up: At [facility], you and your health needs are our priority.  As part of our continuing mission to provide you with exceptional heart care, we have created designated Provider Care Teams.  These Care Teams include your primary Cardiologist (physician) and Advanced Practice Providers (APPs -  Physician Assistants and Nurse Practitioners) who all work together to provide you with the care you need, when you need it.  We recommend signing up for the patient portal called "MyChart".  Sign up information is provided on this After Visit Summary.  MyChart is used to connect with patients for Virtual Visits (Telemedicine).  Patients are able to view lab/test results, encounter notes, upcoming appointments, etc.  Non-urgent messages can be sent to your provider as well.   To learn more about what you can do with MyChart, go to [email or web address].    Your next appointment:   10 month(s)  The format for your next appointment:   In Person  Provider:   Skeet Latch, MD or Laurann Montana, NP AFTER YOU ECHO IN MAY 2023   [facility] Stumpf,acting as a scribe for Skeet Latch, MD.,have documented all relevant documentation on the behalf of Skeet Latch, MD,as directed by  Skeet Latch, MD while in the presence of Skeet Latch, MD.  I, [place] Oval Linsey, MD have reviewed all documentation for this visit.  The documentation of the exam, diagnosis, procedures, and orders on 05/06/2021 are all accurate and complete.   Signed, Skeet Latch, MD  05/06/2021 11:52 AM    [place]

## 2021-05-06 NOTE — Progress Notes (Addendum)
Cardiology Office Note:    Date:  05/06/2021   ID:  Megan Allen, DOB 11-19-1955, MRN [id number]  PCP:  [name], MD   [facility] Providers Cardiologist:  None     Referring MD: [name], MD   No chief complaint on file.   History of Present Illness:    Megan Allen is a 65 y.o. female with a hx of  moderate mitral regurgitation, here for follow-up. She was seen in the ED 12/2020 with chest pain. Cardiac enzymes were negative, and she was referred to cardiology as an outpatient. She saw Dr. Margaretann Loveless and was referred for a coronary CTA 01/2021 which was normal. Her calcium score was 0. She was also noted to have a murmur. She had an Echo 52022 with normal systolic function and moderate mitral regurgitation.  Today, she is accompanied by her daughter, who also provides some history. This week she has been feeling some central chest pain. However, she is unsure if her pain is related to stress, her stomach, or what she is eating. After eating certain foods she noticed worsening chest pains. For exercise, she is in physical therapy and completing those exercises. Typically she feels okay while exercising, but notes she may not be exerting herself enough. Occasionally she also walks short distances with her daughter. However, she is limited by her sciatic back pain. She was told about the benefits of sea moss, and this has been helping her pain management. Currently she takes aspirin because she was told her cholesterol was slightly elevated. At home her blood pressure has been stable. Her 10-year risk score is 5.6%. She denies any shortness of breath, palpitations, or exertional symptoms. No headaches, lightheadedness, or syncope to report. Also has no lower extremity edema, orthopnea or PND.   Past Medical History:  Diagnosis Date   Atypical chest pain 05/06/2021   Chronic back pain    Headache    Moderate mitral regurgitation 05/06/2021   Murmur    Muscle  spasm    Obesity (BMI 30.0-34.9) 05/06/2021   Pure hypercholesterolemia 05/06/2021    Past Surgical History:  Procedure Laterality Date   ABDOMINAL HYSTERECTOMY     partial   BREAST CYST ASPIRATION     COLONOSCOPY WITH PROPOFOL N/A 10/12/2020   Procedure: COLONOSCOPY WITH PROPOFOL;  Surgeon: Eloise Harman, DO;  [facility];  Service: Endoscopy;  Laterality: N/A;  12:45   POLYPECTOMY  10/12/2020   Procedure: POLYPECTOMY;  Surgeon: Eloise Harman, DO;  [facility];  Service: Endoscopy;;    Current Medications: Current Meds  Medication Sig   acetaminophen (TYLENOL) 650 MG CR tablet Take 650 mg by mouth as needed for pain.   Ascorbic Acid (VITAMIN C) 1000 MG tablet Take 1,000 mg by mouth daily.   bisacodyl (DULCOLAX) 5 MG EC tablet Take 5 mg by mouth daily as needed for moderate constipation.   Calcium Carb-Cholecalciferol 600-800 MG-UNIT TABS Take 1 tablet by mouth daily.   Cholecalciferol (VITAMIN D3 PO) Take 2,000 Units by mouth daily.   Cyanocobalamin (VITAMIN B-12 PO) Take 3,000 mcg by mouth daily.   Diclofenac Sodium 2 % SOLN Place onto the skin daily.   ECHINACEA PO Take 400 mg by mouth as needed.   EPINEPHrine 0.3 mg/0.3 mL IJ SOAJ injection Inject 0.3 mLs (0.3 mg total) into the muscle as needed for anaphylaxis (severe allergic reactions from bee or other insect stings resulting in shortness of breath).   famotidine (PEPCID) 40  MG tablet Take 1 tablet (40 mg total) by mouth daily. For five days.   meclizine (ANTIVERT) 12.5 MG tablet Take 1 tablet (12.5 mg total) by mouth 3 (three) times daily as needed for dizziness. (Patient taking differently: Take 12.5 mg by mouth as needed for dizziness.)   methocarbamol (ROBAXIN) 750 MG tablet Take 1 tablet (750 mg total) by mouth every 8 (eight) hours as needed for muscle spasms.   Multiple Vitamin (MULTIVITAMIN) capsule Take 1 capsule by mouth daily.   Na Sulfate-K Sulfate-Mg Sulf 17.5-3.13-1.6 GM/177ML SOLN  TAKE AS DIRECTED   omeprazole (PRILOSEC) 20 MG capsule Take 1 capsule (20 mg total) by mouth 2 (two) times daily before a meal. (Patient taking differently: Take 20 mg by mouth as needed.)   SUMAtriptan (IMITREX) 50 MG tablet Take 1 tab at onset of migraine.  May repeat in 2 hrs, if needed.  Max dose: 2 tabs/day. This is a 30 day prescription. (Patient taking differently: as needed. Take 1 tab at onset of migraine.  May repeat in 2 hrs, if needed.  Max dose: 2 tabs/day. This is a 30 day prescription.)   UNABLE TO FIND Take 1 tablet by mouth as needed. OTC Calms for simple nervous tension and occasional sleeplessness.   [DISCONTINUED] aspirin EC 81 MG tablet Take 81 mg by mouth daily.     Allergies:   Bee venom and Caffeine   Social History   Socioeconomic History   Marital status: Married    Spouse name: Not on file   Number of children: 3   Years of education: some college   Highest education level: Not on file  Occupational History   Occupation: Hair Dresser  Tobacco Use   Smoking status: Never   Smokeless tobacco: Never  Vaping Use   Vaping Use: Never used  Substance and Sexual Activity   Alcohol use: No   Drug use: No   Sexual activity: Yes    Birth control/protection: Surgical  Other Topics Concern   Not on file  Social History Narrative   Lives at home with her husband.   Right-handed.   No caffeine per day.   Social Determinants of Health   Financial Resource Strain: Not on file  Food Insecurity: Not on file  Transportation Needs: Not on file  Physical Activity: Not on file  Stress: Not on file  Social Connections: Not on file     Family History: The patient's family history includes Diabetes in her brother and father; Heart disease in her daughter; Hyperlipidemia in her mother; Stroke in her maternal grandfather.  ROS:   Please see the history of present illness.    (+) Central chest pain (+) Abdominal pain (+) Back pain (+) Stress All other systems  reviewed and are negative.  EKGs/Labs/Other Studies Reviewed:    The following studies were reviewed today:  Echo 02/23/2021:  1. Eccentric posteriorly directed mitral regurgitation jet noted. MR is  at least moderate, but poorly visualized. Mitral inflow is A dominant and  LA size is normal. There is no overt AMVL prolapse. Pathology could be  related to restricted PMVL with  pseudo prolapse of the AMVL. The mitral valve is grossly normal. Moderate  mitral valve regurgitation. No evidence of mitral stenosis.   2. Left ventricular ejection fraction, by estimation, is 60 to 65%. The  left ventricle has normal function. The left ventricle has no regional  wall motion abnormalities. Left ventricular diastolic parameters were  normal.   3. Right ventricular  systolic function is normal. The right ventricular  size is normal. There is normal pulmonary artery systolic pressure. The  estimated right ventricular systolic pressure is 50.9 mmHg.   4. The aortic valve is tricuspid. Aortic valve regurgitation is not  visualized. No aortic stenosis is present.   5. The inferior vena cava is normal in size with greater than 50%  respiratory variability, suggesting right atrial pressure of 3 mmHg.   EKG:   05/06/2021: EKG is not ordered today.  Recent Labs: 01/11/2021: BUN 14; Creatinine, Ser 0.77; Hemoglobin 13.6; Platelets 188; Potassium 3.7; Sodium 138  Recent Lipid Panel    Component Value Date/Time   CHOL 194 06/11/2020 1041   TRIG 58 06/11/2020 1041   HDL 61 06/11/2020 1041   CHOLHDL 3.2 06/11/2020 1041   CHOLHDL 3.5 09/13/2016 1202   VLDL 12 09/13/2016 1202   LDLCALC 122 (H) 06/11/2020 1041        Physical Exam:    VS:  BP 118/72   Pulse 75   Ht 5' 6.5" (1.689 m)   Wt 199 lb 9.6 oz (90.5 kg)   SpO2 97%   BMI 31.73 kg/m     Wt Readings from Last 3 Encounters:  05/06/21 199 lb 9.6 oz (90.5 kg)  04/06/21 204 lb (92.5 kg)  03/10/21 222 lb 3.2 oz (100.8 kg)     GEN: Well  nourished, well developed in no acute distress HEENT: Normal NECK: No JVD; No carotid bruits LYMPHATICS: No lymphadenopathy CARDIAC: RRR, 2/6 systolic murmur, rubs, gallops RESPIRATORY:  Clear to auscultation without rales, wheezing or rhonchi  ABDOMEN: Soft, non-tender, non-distended MUSCULOSKELETAL:  No edema; No deformity  SKIN: Warm and dry NEUROLOGIC:  Alert and oriented x 3 PSYCHIATRIC:  Normal affect   ASSESSMENT:    1. Moderate mitral regurgitation   2. Obesity (BMI 30.0-34.9)   3. Pure hypercholesterolemia   4. Atypical chest pain    PLAN:   Moderate mitral regurgitation Moderate mitral regurgitation on echo 02/2021.  She has no heart failure symptoms.  We will plan for a repeat echo in a year.  Obesity (BMI 30.0-34.9) Patient was encouraged to increase her exercise to at least 150 minutes weekly.  Pure hypercholesterolemia ASCVD 10-year risk is 5.4%.  Therefore we will stop her aspirin.  She had no CAD on her coronary CT-A.  Atypical chest pain Her chest pain is atypical and never occurs with exertion.  I suspect it is related to GERD.  Continue famotidine and omeprazole.  Coronary CT was negative for CAD.  In order of problems listed above:  Disposition: FU with Tiffany C. Oval Linsey, MD, [facility] in 6 months.   Medication Adjustments/Labs and Tests Ordered: Current medicines are reviewed at length with the patient today.  Concerns regarding medicines are outlined above.  No orders of the defined types were placed in this encounter.  No orders of the defined types were placed in this encounter.   Patient Instructions  Medication Instructions:  STOP ASPIRIN   *If you need a refill on your cardiac medications before your next appointment, please call your pharmacy*  Lab Work: NONE  Testing/Procedures: Your physician has requested that you have an echocardiogram. Echocardiography is a painless test that uses sound waves to create images of your heart. It  provides your doctor with information about the size and shape of your heart and how well your heart's chambers and valves are working. This procedure takes approximately one hour. There are no restrictions for this procedure.  TO BE DONE IN MAY 2023   Follow-Up: At [facility], you and your health needs are our priority.  As part of our continuing mission to provide you with exceptional heart care, we have created designated Provider Care Teams.  These Care Teams include your primary Cardiologist (physician) and Advanced Practice Providers (APPs -  Physician Assistants and Nurse Practitioners) who all work together to provide you with the care you need, when you need it.  We recommend signing up for the patient portal called "MyChart".  Sign up information is provided on this After Visit Summary.  MyChart is used to connect with patients for Virtual Visits (Telemedicine).  Patients are able to view lab/test results, encounter notes, upcoming appointments, etc.  Non-urgent messages can be sent to your provider as well.   To learn more about what you can do with MyChart, go to [email or web address].    Your next appointment:   10 month(s)  The format for your next appointment:   In Person  Provider:   Skeet Latch, MD or Laurann Montana, NP AFTER YOU ECHO IN MAY 2023   [facility] Stumpf,acting as a scribe for Skeet Latch, MD.,have documented all relevant documentation on the behalf of Skeet Latch, MD,as directed by  Skeet Latch, MD while in the presence of Skeet Latch, MD.  I, [place] Oval Linsey, MD have reviewed all documentation for this visit.  The documentation of the exam, diagnosis, procedures, and orders on 05/06/2021 are all accurate and complete.   Signed, Skeet Latch, MD  05/06/2021 11:43 AM    [place]

## 2021-05-06 NOTE — Assessment & Plan Note (Signed)
Patient was encouraged to increase her exercise to at least 150 minutes weekly.

## 2021-05-31 ENCOUNTER — Other Ambulatory Visit: Payer: Self-pay | Admitting: Family Medicine

## 2021-05-31 DIAGNOSIS — Z1231 Encounter for screening mammogram for malignant neoplasm of breast: Secondary | ICD-10-CM

## 2021-06-14 ENCOUNTER — Ambulatory Visit: Payer: BLUE CROSS/BLUE SHIELD | Admitting: Family Medicine

## 2021-06-30 ENCOUNTER — Other Ambulatory Visit (HOSPITAL_BASED_OUTPATIENT_CLINIC_OR_DEPARTMENT_OTHER): Payer: Self-pay | Admitting: *Deleted

## 2021-06-30 DIAGNOSIS — E7800 Pure hypercholesterolemia, unspecified: Secondary | ICD-10-CM

## 2021-06-30 NOTE — Progress Notes (Signed)
Order placed for PREP per Dr Oval Linsey

## 2021-07-02 ENCOUNTER — Telehealth: Payer: Self-pay

## 2021-07-02 NOTE — Telephone Encounter (Signed)
Call to pt reference PREP referral Explained program. Is interested Has [place] address but spends most of her time in [place] Could do [place] or [place]. Prefers mornings but could do afternoons.  Will call her back at the next [place] class -likley last week of Sept. Confirming times currently.

## 2021-07-12 IMAGING — US US BREAST*L* LIMITED INC AXILLA
1 series · 1 of 1 positions shown · non-contrast
Comparison: Previous exam(s).

CLINICAL DATA: Possible left nipple inversion noted by mammographer
at time of mammogram. Patient states no change in left nipple.

EXAM:
DIGITAL DIAGNOSTIC BILATERAL MAMMOGRAM WITH TOMO
ULTRASOUND LEFT BREAST

[Series 1: us breast*left* limited inc axilla · 0.06mm/px · 1 of 1 slices shown]
[im 1/1]
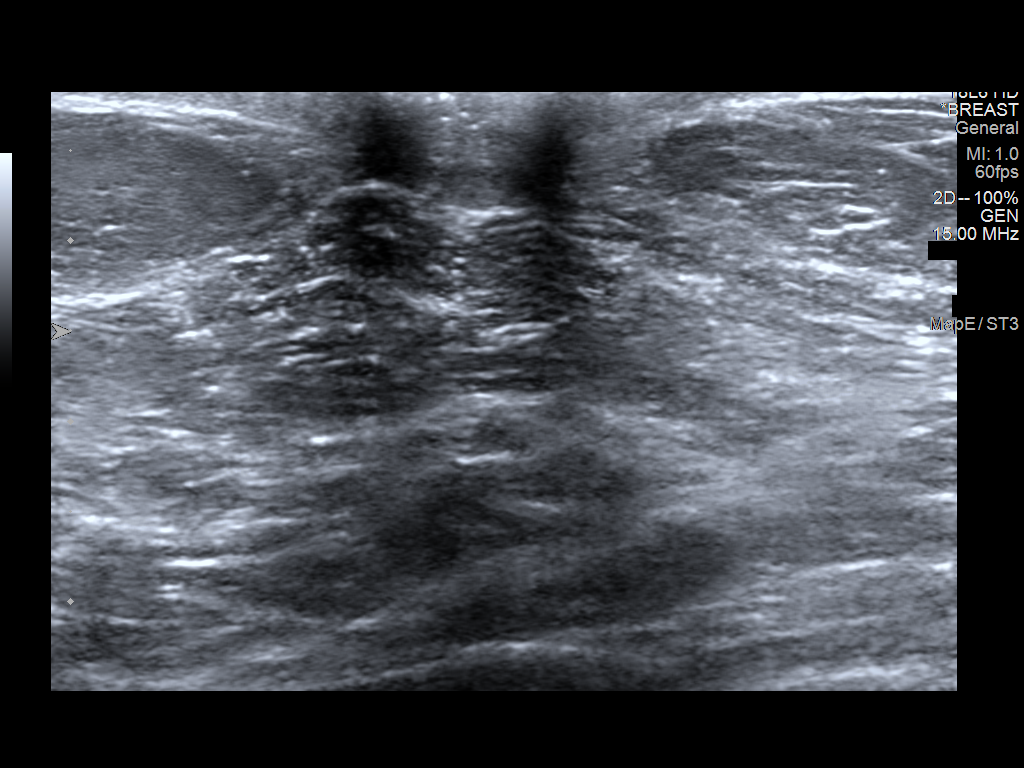

[1 of 1 positions shown; findings below may reference images not displayed]

ACR Breast Density Category b: There are scattered areas of
fibroglandular density.
FINDINGS: Examination demonstrates mild left nipple inversion without
significant change from 9009. No definite focal retroareolar
abnormality. Remainder of the left breast as well as the right
breast is unchanged.

On physical exam, there is mild flattening/inversion of both nipples
left slightly worse than right. No focal palpable left retroareolar
mass.

Targeted ultrasound is performed, showing no focal abnormality in
the left retroareolar region.
IMPRESSION: Mild bilateral nipple flattening/inversion left greater than right
without significant change since 9009. No focal sonographic
abnormality in the left retroareolar region. Findings likely due to
normal variant.

RECOMMENDATION:
Recommend continued annual bilateral screening mammographic
follow-up.

I have discussed the findings and recommendations with the patient.
If applicable, a reminder letter will be sent to the patient
regarding the next appointment.

BI-RADS CATEGORY  2: Benign.

## 2021-07-15 ENCOUNTER — Ambulatory Visit
Admission: RE | Admit: 2021-07-15 | Discharge: 2021-07-15 | Disposition: A | Discharge status: Home / Self Care | Payer: BLUE CROSS/BLUE SHIELD | Source: Ambulatory Visit | Attending: Family Medicine | Admitting: Family Medicine

## 2021-07-15 ENCOUNTER — Other Ambulatory Visit: Payer: Self-pay

## 2021-07-15 DIAGNOSIS — Z1231 Encounter for screening mammogram for malignant neoplasm of breast: Secondary | ICD-10-CM

## 2021-08-05 ENCOUNTER — Telehealth: Payer: Self-pay

## 2021-08-05 NOTE — Telephone Encounter (Signed)
Call placed to pt reference PREP at [facility] starting 08/17/21  Can do schedule and is interested in participating Intake scheduled for 08/10/21 at 330pm

## 2021-08-10 NOTE — Progress Notes (Signed)
[facility] PREP Evaluation  Patient Details  Name: Megan Allen MRN: [id number] Date of Birth: October 06, 1956 Age: 65 y.o. PCP: [name], MD  Vitals:   08/10/21 1600  BP: 108/70  Pulse: 73  SpO2: 97%  Weight: 198 lb 12.8 oz (90.2 kg)     [facility] Eval - 08/10/21 1700       [facility] "PREP" Location   [facility] "PREP" Location [facility]      Referral    Referring Provider Oval Linsey    Reason for referral Inactivity    Program Start Date 08/17/21   T/TH 2p-315pm x 12wks     Measurement   Waist Circumference 41 inches    Hip Circumference 46 inches    Body fat 43.4 percent      Information for Trainer   Goals elimnate back and leg pain, improve balance    Current Exercise Walks and uses elipitical at home    Orthopedic Concerns Back, right hip, right shoulder/chest    Pertinent Medical History Leaky valve    Current Barriers none    Restrictions/Precautions Fall risk      Timed Up and Go (TUGS)   Timed Up and Go Low risk <9 seconds   thinks balance struggles are related to back pain     Mobility and Daily Activities   I find it easy to walk up or down two or more flights of stairs. 1    I have no trouble taking out the trash. 3    I do housework such as vacuuming and dusting on my own without difficulty. 2    I can easily lift a gallon of milk (8lbs). 2    I can easily walk a mile. 4    I have no trouble reaching into high cupboards or reaching down to pick up something from the floor. 2    I do not have trouble doing out-door work such as Armed forces logistics/support/administrative officer, raking leaves, or gardening. 1      Mobility and Daily Activities   I feel younger than my age. 4    I feel independent. 4    I feel energetic. 3    I live an active life.  4    I feel strong. 2    I feel healthy. 3    I feel active as other people my age. 3      How fit and strong are you.   Fit and Strong Total Score 38            Past Medical History:  Diagnosis Date   Atypical chest pain  05/06/2021   Chronic back pain    Headache    Moderate mitral regurgitation 05/06/2021   Murmur    Muscle spasm    Obesity (BMI 30.0-34.9) 05/06/2021   Pure hypercholesterolemia 05/06/2021   Past Surgical History:  Procedure Laterality Date   ABDOMINAL HYSTERECTOMY     partial   BREAST CYST ASPIRATION     COLONOSCOPY WITH PROPOFOL N/A 10/12/2020   Procedure: COLONOSCOPY WITH PROPOFOL;  Surgeon: Eloise Harman, DO;  [facility];  Service: Endoscopy;  Laterality: N/A;  12:45   POLYPECTOMY  10/12/2020   Procedure: POLYPECTOMY;  Surgeon: Eloise Harman, DO;  [facility];  Service: Endoscopy;;   Social History   Tobacco Use  Smoking Status Never  Smokeless Tobacco Never    Barnett Hatter 08/10/2021, 5:39 PM

## 2021-08-19 NOTE — Progress Notes (Signed)
[facility] PREP Weekly Session  Patient Details  Name: Consuelo Suthers MRN: [id number] Date of Birth: Dec 31, 1955 Age: 65 y.o. PCP: [name], MD  There were no vitals filed for this visit.   [facility] Weekly seesion - 08/19/21 1600       [facility] "PREP" Location   [facility] "PREP" Location [facility]      Weekly Session   Topic Discussed Goal setting and welcome to the program   scale of perceived exertion, fit test done   Classes attended to date 2             Barnett Hatter 08/19/2021, 4:32 PM

## 2021-08-24 NOTE — Progress Notes (Signed)
[facility] PREP Weekly Session  Patient Details  Name: Megan Allen MRN: [id number] Date of Birth: August 17, 1956 Age: 65 y.o. PCP: [name], MD  Vitals:   08/24/21 1700  Weight: 194 lb (88 kg)     [facility] Weekly seesion - 08/24/21 1700       [facility] "PREP" Location   [facility]PREP[facility] Location [facility]      Weekly Session   Topic Discussed Other ways to be active;Importance of resistance training    Minutes exercised this week 50 minutes    Classes attended to date [place] 08/24/2021, 5:01 PM

## 2021-08-31 NOTE — Progress Notes (Signed)
[facility] PREP Weekly Session  Patient Details  Name: Megan Allen MRN: [id number] Date of Birth: [birth year] Age: 65 y.o. PCP: [name], MD  There were no vitals filed for this visit.   [facility] Weekly seesion - 08/31/21 1600       [facility] "PREP" Location   [facility]PREP[facility] Location [facility]      Weekly Session   Topic Discussed Healthy eating tips    Minutes exercised this week 185 minutes    Classes attended to date [place] 08/31/2021, 4:43 PM

## 2021-09-01 ENCOUNTER — Ambulatory Visit: Payer: BLUE CROSS/BLUE SHIELD | Admitting: Family Medicine

## 2021-09-02 ENCOUNTER — Other Ambulatory Visit: Payer: Self-pay

## 2021-09-02 ENCOUNTER — Ambulatory Visit: Payer: BLUE CROSS/BLUE SHIELD | Attending: Family Medicine | Admitting: Family Medicine

## 2021-09-02 ENCOUNTER — Encounter: Payer: Self-pay | Admitting: Family Medicine

## 2021-09-02 VITALS — BP 126/84 | HR 71 | Ht 65.5 in | Wt 192.8 lb

## 2021-09-02 DIAGNOSIS — E2839 Other primary ovarian failure: Secondary | ICD-10-CM

## 2021-09-02 DIAGNOSIS — Z13228 Encounter for screening for other metabolic disorders: Secondary | ICD-10-CM

## 2021-09-02 DIAGNOSIS — Z0000 Encounter for general adult medical examination without abnormal findings: Secondary | ICD-10-CM

## 2021-09-02 NOTE — Progress Notes (Signed)
Subjective:  Patient ID: Megan Allen, female    DOB: 1956/08/11  Age: 65 y.o. MRN: [id number]  CC: Pain   HPI Megan Allen is a 65 y.o. year old female with a history of chronic low back pain who presents today for an annual physical exam. Last colonoscopy was in 2021, she is up-to-date on mammogram from 06/2021.  Pap smear no longer indicated due to previous history of hysterectomy. She is due for a bone density study. Interval History: Her back continues to hurt but she is followed by Ortho Raliegh Ip) and is 'pushing through'. She is currently under the care of Dr. Oval Linsey, cardiology and is registered for the [facility] PREP classes. Past Medical History:  Diagnosis Date   Atypical chest pain 05/06/2021   Chronic back pain    Headache    Moderate mitral regurgitation 05/06/2021   Murmur    Muscle spasm    Obesity (BMI 30.0-34.9) 05/06/2021   Pure hypercholesterolemia 05/06/2021    Past Surgical History:  Procedure Laterality Date   ABDOMINAL HYSTERECTOMY     partial   BREAST CYST ASPIRATION     COLONOSCOPY WITH PROPOFOL N/A 10/12/2020   Procedure: COLONOSCOPY WITH PROPOFOL;  Surgeon: Eloise Harman, DO;  [facility];  Service: Endoscopy;  Laterality: N/A;  12:45   POLYPECTOMY  10/12/2020   Procedure: POLYPECTOMY;  Surgeon: Eloise Harman, DO;  [facility];  Service: Endoscopy;;    Family History  Problem Relation Age of Onset   Hyperlipidemia Mother    Diabetes Father    Diabetes Brother    Heart disease Daughter    Stroke Maternal Grandfather     Allergies  Allergen Reactions   Bee Venom    Caffeine     Chest pain, heart racing    Outpatient Medications Prior to Visit  Medication Sig Dispense Refill   acetaminophen (TYLENOL) 650 MG CR tablet Take 650 mg by mouth as needed for pain.     Ascorbic Acid (VITAMIN C) 1000 MG tablet Take 1,000 mg by mouth daily.     bisacodyl (DULCOLAX) 5 MG EC tablet Take 5 mg by  mouth daily as needed for moderate constipation.     Calcium Carb-Cholecalciferol 600-800 MG-UNIT TABS Take 1 tablet by mouth daily.     Cholecalciferol (VITAMIN D3 PO) Take 2,000 Units by mouth daily.     Cyanocobalamin (VITAMIN B-12 PO) Take 3,000 mcg by mouth daily.     Diclofenac Sodium 2 % SOLN Place onto the skin daily.     ECHINACEA PO Take 400 mg by mouth as needed.     EPINEPHrine 0.3 mg/0.3 mL IJ SOAJ injection Inject 0.3 mLs (0.3 mg total) into the muscle as needed for anaphylaxis (severe allergic reactions from bee or other insect stings resulting in shortness of breath). 1 each 0   famotidine (PEPCID) 40 MG tablet Take 1 tablet (40 mg total) by mouth daily. For five days. 5 tablet 0   meclizine (ANTIVERT) 12.5 MG tablet Take 1 tablet (12.5 mg total) by mouth 3 (three) times daily as needed for dizziness. (Patient taking differently: Take 12.5 mg by mouth as needed for dizziness.) 10 tablet 0   methocarbamol (ROBAXIN) 750 MG tablet Take 1 tablet (750 mg total) by mouth every 8 (eight) hours as needed for muscle spasms. 90 tablet 1   Multiple Vitamin (MULTIVITAMIN) capsule Take 1 capsule by mouth daily.     Na Sulfate-K Sulfate-Mg Sulf 17.5-3.13-1.6 GM/177ML  SOLN TAKE AS DIRECTED 354 mL 0   omeprazole (PRILOSEC) 20 MG capsule Take 1 capsule (20 mg total) by mouth 2 (two) times daily before a meal. (Patient taking differently: Take 20 mg by mouth as needed.) 28 capsule 0   SUMAtriptan (IMITREX) 50 MG tablet Take 1 tab at onset of migraine.  May repeat in 2 hrs, if needed.  Max dose: 2 tabs/day. This is a 30 day prescription. (Patient taking differently: as needed. Take 1 tab at onset of migraine.  May repeat in 2 hrs, if needed.  Max dose: 2 tabs/day. This is a 30 day prescription.) 12 tablet 6   UNABLE TO FIND Take 1 tablet by mouth as needed. OTC Calms for simple nervous tension and occasional sleeplessness.     No facility-administered medications prior to visit.     ROS Review of  Systems  Constitutional:  Negative for activity change, appetite change and fatigue.  HENT:  Negative for congestion, sinus pressure and sore throat.   Eyes:  Negative for visual disturbance.  Respiratory:  Negative for cough, chest tightness, shortness of breath and wheezing.   Cardiovascular:  Negative for chest pain and palpitations.  Gastrointestinal:  Negative for abdominal distention, abdominal pain and constipation.  Endocrine: Negative for polydipsia.  Genitourinary:  Negative for dysuria and frequency.  Musculoskeletal:  Negative for arthralgias and back pain.  Skin:  Negative for rash.  Neurological:  Negative for tremors, light-headedness and numbness.  Hematological:  Does not bruise/bleed easily.  Psychiatric/Behavioral:  Negative for agitation and behavioral problems.    Objective:  BP 126/84 (BP Location: Left Arm, Patient Position: Sitting, Cuff Size: Large)   Pulse 71   Ht 5' 5.5" (1.664 m)   Wt 192 lb 12.8 oz (87.5 kg)   SpO2 100%   BMI 31.60 kg/m   BP/Weight 09/02/2021 08/24/2021 77/08/6578  Systolic BP 038 - 333  Diastolic BP 84 - 70  Wt. (Lbs) 192.8 194 198.8  BMI 31.6 30.84 31.61      Physical Exam Constitutional:      General: She is not in acute distress.    Appearance: She is well-developed. She is not diaphoretic.  HENT:     Head: Normocephalic.     Right Ear: External ear normal.     Left Ear: External ear normal.     Nose: Nose normal.     Mouth/Throat:     Mouth: Mucous membranes are moist.  Eyes:     Extraocular Movements: Extraocular movements intact.     Conjunctiva/sclera: Conjunctivae normal.     Pupils: Pupils are equal, round, and reactive to light.  Neck:     Vascular: No JVD.  Cardiovascular:     Rate and Rhythm: Normal rate and regular rhythm.     Pulses: Normal pulses.     Heart sounds: Normal heart sounds. No murmur heard.   No gallop.  Pulmonary:     Effort: Pulmonary effort is normal. No respiratory distress.      Breath sounds: Normal breath sounds. No wheezing or rales.  Chest:     Chest wall: No tenderness.  Abdominal:     General: Bowel sounds are normal. There is no distension.     Palpations: Abdomen is soft. There is no mass.     Tenderness: There is no abdominal tenderness.  Musculoskeletal:        General: No tenderness. Normal range of motion.     Cervical back: Normal range of motion.  Skin:  General: Skin is warm and dry.  Neurological:     Mental Status: She is alert and oriented to person, place, and time.     Deep Tendon Reflexes: Reflexes are normal and symmetric.  Psychiatric:        Mood and Affect: Mood normal.    CMP Latest Ref Rng & Units 01/11/2021 05/03/2020 11/28/2017  Glucose 70 - 99 mg/dL 90 128(H) 77  BUN 8 - 23 mg/dL 14 28(H) 13  Creatinine 0.44 - 1.00 mg/dL 0.77 0.79 0.75  Sodium 135 - 145 mmol/L 138 137 142  Potassium 3.5 - 5.1 mmol/L 3.7 4.2 4.0  Chloride 98 - 111 mmol/L 104 102 100  CO2 22 - 32 mmol/L 25 25 26   Calcium 8.9 - 10.3 mg/dL 9.2 8.9 9.9  Total Protein 6.5 - 8.1 g/dL - 6.7 6.9  Total Bilirubin 0.3 - 1.2 mg/dL - 0.2(L) 0.4  Alkaline Phos 38 - 126 U/L - 63 72  AST 15 - 41 U/L - 18 16  ALT 0 - 44 U/L - 18 18    Lipid Panel     Component Value Date/Time   CHOL 194 06/11/2020 1041   TRIG 58 06/11/2020 1041   HDL 61 06/11/2020 1041   CHOLHDL 3.2 06/11/2020 1041   CHOLHDL 3.5 09/13/2016 1202   VLDL 12 09/13/2016 1202   LDLCALC 122 (H) 06/11/2020 1041    CBC    Component Value Date/Time   WBC 6.9 01/11/2021 1628   RBC 4.39 01/11/2021 1628   HGB 13.6 01/11/2021 1628   HCT 40.7 01/11/2021 1628   PLT 188 01/11/2021 1628   MCV 92.7 01/11/2021 1628   MCH 31.0 01/11/2021 1628   MCHC 33.4 01/11/2021 1628   RDW 13.3 01/11/2021 1628   LYMPHSABS 1,960 09/13/2016 1202   MONOABS 448 09/13/2016 1202   EOSABS 56 09/13/2016 1202   BASOSABS 0 09/13/2016 1202    Lab Results  Component Value Date   HGBA1C 5.6 05/12/2020    Assessment & Plan:   1. Annual physical exam Counseled on 150 minutes of exercise per week, healthy eating (including decreased daily intake of saturated fats, cholesterol, added sugars, sodium), routine healthcare maintenance.   2. Estrogen deficiency - DG Bone Density; Future  3. Screening for metabolic disorder - Hemoglobin A1c - LP+Non-HDL Cholesterol - CMP14+EGFR - CBC with Differential/Platelet   No orders of the defined types were placed in this encounter.   Follow-up: Return in about 6 months (around 03/02/2022) for Chronic medical conditions.       [name], MD, FAAFP. [facility] and [facility] [place], [place]   09/02/2021, 12:43 PM

## 2021-09-02 NOTE — Patient Instructions (Signed)
Health Maintenance After Age 65 After age 65, you are at a higher risk for certain long-term diseases and infections as well as injuries from falls. Falls are a major cause of broken bones and head injuries in people who are older than age 65. Getting regular preventive care can help to keep you healthy and well. Preventive care includes getting regular testing and making lifestyle changes as recommended by your health care provider. Talk with your health care provider about: Which screenings and tests you should have. A screening is a test that checks for a disease when you have no symptoms. A diet and exercise plan that is right for you. What should I know about screenings and tests to prevent falls? Screening and testing are the best ways to find a health problem early. Early diagnosis and treatment give you the best chance of managing medical conditions that are common after age 65. Certain conditions and lifestyle choices may make you more likely to have a fall. Your health care provider may recommend: Regular vision checks. Poor vision and conditions such as cataracts can make you more likely to have a fall. If you wear glasses, make sure to get your prescription updated if your vision changes. Medicine review. Work with your health care provider to regularly review all of the medicines you are taking, including over-the-counter medicines. Ask your health care provider about any side effects that may make you more likely to have a fall. Tell your health care provider if any medicines that you take make you feel dizzy or sleepy. Strength and balance checks. Your health care provider may recommend certain tests to check your strength and balance while standing, walking, or changing positions. Foot health exam. Foot pain and numbness, as well as not wearing proper footwear, can make you more likely to have a fall. Screenings, including: Osteoporosis screening. Osteoporosis is a condition that causes  the bones to get weaker and break more easily. Blood pressure screening. Blood pressure changes and medicines to control blood pressure can make you feel dizzy. Depression screening. You may be more likely to have a fall if you have a fear of falling, feel depressed, or feel unable to do activities that you used to do. Alcohol use screening. Using too much alcohol can affect your balance and may make you more likely to have a fall. Follow these instructions at home: Lifestyle Do not drink alcohol if: Your health care provider tells you not to drink. If you drink alcohol: Limit how much you have to: 0-1 drink a day for women. 0-2 drinks a day for men. Know how much alcohol is in your drink. In the [place] one drink equals one 12 oz bottle of beer (355 mL), one 5 oz glass of wine (148 mL), or one 1 oz glass of hard liquor (44 mL). Do not use any products that contain nicotine or tobacco. These products include cigarettes, chewing tobacco, and vaping devices, such as e-cigarettes. If you need help quitting, ask your health care provider. Activity  Follow a regular exercise program to stay fit. This will help you maintain your balance. Ask your health care provider what types of exercise are appropriate for you. If you need a cane or walker, use it as recommended by your health care provider. Wear supportive shoes that have nonskid soles. Safety  Remove any tripping hazards, such as rugs, cords, and clutter. Install safety equipment such as grab bars in bathrooms and safety rails on stairs. Keep rooms and walkways   well-lit. General instructions Talk with your health care provider about your risks for falling. Tell your health care provider if: You fall. Be sure to tell your health care provider about all falls, even ones that seem minor. You feel dizzy, tiredness (fatigue), or off-balance. Take over-the-counter and prescription medicines only as told by your health care provider. These include  supplements. Eat a healthy diet and maintain a healthy weight. A healthy diet includes low-fat dairy products, low-fat (lean) meats, and fiber from whole grains, beans, and lots of fruits and vegetables. Stay current with your vaccines. Schedule regular health, dental, and eye exams. Summary Having a healthy lifestyle and getting preventive care can help to protect your health and wellness after age 65. Screening and testing are the best way to find a health problem early and help you avoid having a fall. Early diagnosis and treatment give you the best chance for managing medical conditions that are more common for people who are older than age 65. Falls are a major cause of broken bones and head injuries in people who are older than age 65. Take precautions to prevent a fall at home. Work with your health care provider to learn what changes you can make to improve your health and wellness and to prevent falls. This information is not intended to replace advice given to you by your health care provider. Make sure you discuss any questions you have with your health care provider. Document Revised: 03/01/2021 Document Reviewed: 03/01/2021 Elsevier Patient Education  2022 Elsevier Inc.  

## 2021-09-03 ENCOUNTER — Telehealth: Payer: Self-pay

## 2021-09-03 ENCOUNTER — Other Ambulatory Visit: Payer: Self-pay | Admitting: Family Medicine

## 2021-09-03 ENCOUNTER — Other Ambulatory Visit: Payer: Self-pay

## 2021-09-03 LAB — CBC WITH DIFFERENTIAL/PLATELET
Basophils Absolute: 0 10*3/uL (ref 0.0–0.2)
Basos: 0 %
EOS (ABSOLUTE): 0.1 10*3/uL (ref 0.0–0.4)
Eos: 1 %
Hematocrit: 42.8 % (ref 34.0–46.6)
Hemoglobin: 14.3 g/dL (ref 11.1–15.9)
Immature Grans (Abs): 0 10*3/uL (ref 0.0–0.1)
Immature Granulocytes: 0 %
Lymphocytes Absolute: 1.9 10*3/uL (ref 0.7–3.1)
Lymphs: 36 %
MCH: 30.2 pg (ref 26.6–33.0)
MCHC: 33.4 g/dL (ref 31.5–35.7)
MCV: 90 fL (ref 79–97)
Monocytes Absolute: 0.4 10*3/uL (ref 0.1–0.9)
Monocytes: 7 %
Neutrophils Absolute: 2.9 10*3/uL (ref 1.4–7.0)
Neutrophils: 56 %
Platelets: 194 10*3/uL (ref 150–450)
RBC: 4.74 x10E6/uL (ref 3.77–5.28)
RDW: 13.2 % (ref 11.7–15.4)
WBC: 5.4 10*3/uL (ref 3.4–10.8)

## 2021-09-03 LAB — CMP14+EGFR
ALT: 13 IU/L (ref 0–32)
AST: 15 IU/L (ref 0–40)
Albumin/Globulin Ratio: 2.2 (ref 1.2–2.2)
Albumin: 4.6 g/dL (ref 3.8–4.8)
Alkaline Phosphatase: 83 IU/L (ref 44–121)
BUN/Creatinine Ratio: 20 (ref 12–28)
BUN: 17 mg/dL (ref 8–27)
Bilirubin Total: 0.3 mg/dL (ref 0.0–1.2)
CO2: 26 mmol/L (ref 20–29)
Calcium: 9.6 mg/dL (ref 8.7–10.3)
Chloride: 101 mmol/L (ref 96–106)
Creatinine, Ser: 0.87 mg/dL (ref 0.57–1.00)
Globulin, Total: 2.1 g/dL (ref 1.5–4.5)
Glucose: 90 mg/dL (ref 70–99)
Potassium: 4.9 mmol/L (ref 3.5–5.2)
Sodium: 140 mmol/L (ref 134–144)
Total Protein: 6.7 g/dL (ref 6.0–8.5)
eGFR: 74 mL/min/{1.73_m2} (ref >59)

## 2021-09-03 LAB — LP+NON-HDL CHOLESTEROL
Cholesterol, Total: 209 mg/dL — ABNORMAL HIGH (ref 100–199)
HDL: 59 mg/dL (ref >39)
LDL Chol Calc (NIH): 140 mg/dL — ABNORMAL HIGH (ref 0–99)
Total Non-HDL-Chol (LDL+VLDL): 150 mg/dL — ABNORMAL HIGH (ref 0–129)
Triglycerides: 54 mg/dL (ref 0–149)
VLDL Cholesterol Cal: 10 mg/dL (ref 5–40)

## 2021-09-03 LAB — HEMOGLOBIN A1C
Est. average glucose Bld gHb Est-mCnc: 117 mg/dL
Hgb A1c MFr Bld: 5.7 % — ABNORMAL HIGH (ref 4.8–5.6)

## 2021-09-03 MED ORDER — ATORVASTATIN CALCIUM 20 MG PO TABS
20.0000 mg | ORAL_TABLET | Freq: Every day | ORAL | 6 refills | Status: DC
  Filled 2021-09-03: qty 30, 30d supply, fill #0

## 2021-09-03 NOTE — Telephone Encounter (Signed)
-----   Message from [name], MD sent at 09/03/2021  9:10 AM EST ----- Please inform her that kidney and liver functions are normal.  Cholesterol is elevated and I have sent a prescription for atorvastatin to the pharmacy; a low-cholesterol diet, exercise will be beneficial.  Labs reveal prediabetes with an A1c of 5.7.  Working on a low carbohydrate diet, exercise, weight loss is recommended in order to prevent progression to type 2 diabetes mellitus.

## 2021-09-03 NOTE — Telephone Encounter (Signed)
Patient name and DOB has been verified Patient was informed of lab results. Patient had no questions.  

## 2021-09-06 NOTE — Telephone Encounter (Signed)
Pt is calling to let the Dr know that she will change her diet. And  would like to know if it is ok to start with red yeast rice instead of started on the atorvastatin?  Cb- [phone]

## 2021-09-07 NOTE — Telephone Encounter (Signed)
Pt returned call to the nurse.

## 2021-09-07 NOTE — Telephone Encounter (Signed)
She can work on her diet and we will repeat her lipid panel at her next visit.  There is no harm in taking the red yeast rice but it would not be as effective as atorvastatin.  If repeat labs are still elevated I would recommend starting atorvastatin.

## 2021-09-07 NOTE — Telephone Encounter (Signed)
Routing to PCP for review.

## 2021-09-07 NOTE — Telephone Encounter (Signed)
See encounter

## 2021-09-07 NOTE — Progress Notes (Signed)
[facility] PREP Weekly Session  Patient Details  Name: Megan Allen MRN: [id number] Date of Birth: May 10, 1956 Age: 65 y.o. PCP: [name], MD  Vitals:   09/07/21 1430  Weight: 193 lb 3.2 oz (87.6 kg)     [facility] Weekly seesion - 09/07/21 1600       [facility] "PREP" Location   [facility] "PREP" Location [place]      Weekly Session   Topic Discussed Health habits    Minutes exercised this week 275 minutes    Classes attended to date [place] 09/07/2021, 4:44 PM

## 2021-09-10 ENCOUNTER — Other Ambulatory Visit: Payer: Self-pay

## 2021-09-14 NOTE — Progress Notes (Signed)
[facility] PREP Weekly Session  Patient Details  Name: Megan Allen MRN: [id number] Date of Birth: [birth year] Age: 65 y.o. PCP: [name], MD  Vitals:   09/14/21 1601  Weight: 193 lb 6.4 oz (87.7 kg)     [facility] Weekly seesion - 09/14/21 1600       [facility] "PREP" Location   [facility] "PREP" Location [facility]      Weekly Session   Topic Discussed Restaurant Eating    Minutes exercised this week 150 minutes    Classes attended to date [place] 09/14/2021, 4:02 PM

## 2021-09-14 NOTE — Progress Notes (Deleted)
[facility] PREP Weekly Session  Patient Details  Name: Megan Allen MRN: [id number] Date of Birth: 12-30-1955 Age: 65 y.o. PCP: [name], MD  Vitals:   09/14/21 1601  Weight: 193 lb 6.4 oz (87.7 kg)     [facility] Weekly seesion - 09/14/21 1600       [facility] "PREP" Location   [facility] "PREP" Location [facility]      Weekly Session   Topic Discussed Restaurant Eating    Minutes exercised this week 150 minutes             Pam M Byrd [place]re[place] 09/14/2021, 4:02 PM

## 2021-09-21 NOTE — Progress Notes (Signed)
[facility] PREP Weekly Session  Patient Details  Name: Megan Allen MRN: [id number] Date of Birth: [birth year] Age: 65 y.o. PCP: [name], MD  Vitals:   09/21/21 1634  Weight: 192 lb 9.6 oz (87.4 kg)     [facility] Weekly seesion - 09/21/21 1600       [facility] "PREP" Location   [facility]PREP[facility] Location [facility]      Weekly Session   Topic Discussed Stress management and problem solving   Relaxation meditation, sleep hygiene   Minutes exercised this week 300 minutes    Classes attended to date [place] 09/21/2021, 4:35 PM

## 2021-09-28 NOTE — Progress Notes (Signed)
[facility] PREP Weekly Session  Patient Details  Name: Megan Allen MRN: [id number] Date of Birth: May 26, 1956 Age: 65 y.o. PCP: [name], MD  Vitals:   09/28/21 1627  Weight: 193 lb (87.5 kg)     [facility] Weekly seesion - 09/28/21 1600       [facility] "PREP" Location   [facility]PREP[facility] Location [facility]      Weekly Session   Topic Discussed Expectations and non-scale victories    Minutes exercised this week 340 minutes    Classes attended to date 12             Barnett Hatter 09/28/2021, 4:28 PM

## 2021-10-05 NOTE — Progress Notes (Signed)
[facility] PREP Weekly Session  Patient Details  Name: Megan Allen MRN: [id number] Date of Birth: 06-09-1956 Age: 65 y.o. PCP: [name], MD  Vitals:   10/05/21 1637  Weight: 189 lb 12.8 oz (86.1 kg)     [facility] Weekly seesion - 10/05/21 1600       [facility] "PREP" Location   [facility] "PREP[facility] Location [facility]      Weekly Session   Topic Discussed Other   Portion control   Minutes exercised this week 240 minutes    Classes attended to date [place] 10/05/2021, 4:38 PM

## 2021-10-12 NOTE — Progress Notes (Signed)
[facility] PREP Weekly Session  Patient Details  Name: Megan Allen MRN: [id number] Date of Birth: 24-Aug-1956 Age: 65 y.o. PCP: [name], MD  Vitals:   10/12/21 1400  Weight: 191 lb (86.6 kg)     [facility] Weekly seesion - 10/12/21 1700       [facility] "PREP" Location   [facility] "PREP" Location [facility]      Weekly Session   Topic Discussed Finding support    Minutes exercised this week 330 minutes    Classes attended to date [place] 10/12/2021, 5:14 PM

## 2021-10-19 NOTE — Progress Notes (Signed)
[facility] PREP Weekly Session  Patient Details  Name: Megan Allen MRN: [id number] Date of Birth: August 19, 1956 Age: 65 y.o. PCP: [name], MD  Vitals:   10/19/21 1618  Weight: 188 lb 3.2 oz (85.4 kg)     [facility] Weekly seesion - 10/19/21 1600       [facility] "PREP" Location   [facility] "PREP" Location [facility]      Weekly Session   Topic Discussed Calorie breakdown    Minutes exercised this week 90 minutes    Classes attended to date 48            Started work on next set of goals for end of program  Barnett Hatter 10/19/2021, 4:19 PM

## 2021-10-26 NOTE — Progress Notes (Signed)
[facility] PREP Weekly Session  Patient Details  Name: Megan Allen MRN: [id number] Date of Birth: 1956-02-22 Age: 66 y.o. PCP: [name], MD  Vitals:   10/26/21 1400  Weight: 187 lb 12.8 oz (85.2 kg)     [facility] Weekly seesion - 10/26/21 1700       [facility] "PREP" Location   [facility] "PREP" Location [facility]      Weekly Session   Topic Discussed Hitting roadblocks    Minutes exercised this week 230 minutes    Classes attended to date 76             [place] 10/26/2021, 5:14 PM

## 2021-11-02 NOTE — Progress Notes (Signed)
[facility] PREP Weekly Session  Patient Details  Name: Megan Allen MRN: [id number] Date of Birth: 1955-12-03 Age: 66 y.o. PCP: [name], MD  Vitals:   11/02/21 1400  Weight: 187 lb 3.2 oz (84.9 kg)     [facility] Weekly seesion - 11/02/21 1700       [facility] "PREP" Location   [facility] "PREP" Location [facility]      Weekly Session   Topic Discussed --   Goals and surveys   Minutes exercised this week 248 minutes    Classes attended to date [place] 11/02/2021, 5:02 PM

## 2021-11-10 NOTE — Progress Notes (Signed)
[facility] PREP Evaluation  Patient Details  Name: Megan Allen MRN: [id number] Date of Birth: Jan 20, 1956 Age: 66 y.o. PCP: [name], MD  Vitals:   11/09/21 1500  BP: 98/68  Pulse: 78  SpO2: 93%  Weight: 184 lb (83.5 kg)     [facility] Eval - 11/10/21 1000       [facility] "PREP" Location   [facility] "PREP" Location [facility]      Referral    Program Start Date --   PREP final class 11/09/21     Measurement   Waist Circumference 37 inches    Hip Circumference 38 inches    Body fat 40.4 percent      Information for Trainer   Goals reset goals; plans to cont to exercise      Mobility and Daily Activities   I find it easy to walk up or down two or more flights of stairs. 2    I have no trouble taking out the trash. 3    I do housework such as vacuuming and dusting on my own without difficulty. 2    I can easily lift a gallon of milk (8lbs). 4    I can easily walk a mile. 4    I have no trouble reaching into high cupboards or reaching down to pick up something from the floor. 3    I do not have trouble doing out-door work such as Armed forces logistics/support/administrative officer, raking leaves, or gardening. 1      Mobility and Daily Activities   I feel younger than my age. 4    I feel independent. 4    I feel energetic. 3    I live an active life.  4    I feel strong. 2    I feel healthy. 3    I feel active as other people my age. 3      How fit and strong are you.   Fit and Strong Total Score 42            Past Medical History:  Diagnosis Date   Atypical chest pain 05/06/2021   Chronic back pain    Headache    Moderate mitral regurgitation 05/06/2021   Murmur    Muscle spasm    Obesity (BMI 30.0-34.9) 05/06/2021   Pure hypercholesterolemia 05/06/2021   Past Surgical History:  Procedure Laterality Date   ABDOMINAL HYSTERECTOMY     partial   BREAST CYST ASPIRATION     COLONOSCOPY WITH PROPOFOL N/A 10/12/2020   Procedure: COLONOSCOPY WITH PROPOFOL;  Surgeon: Eloise Harman,  DO;  [facility];  Service: Endoscopy;  Laterality: N/A;  12:45   POLYPECTOMY  10/12/2020   Procedure: POLYPECTOMY;  Surgeon: Eloise Harman, DO;  [facility];  Service: Endoscopy;;   Social History   Tobacco Use  Smoking Status Never  Smokeless Tobacco Never   Fit testing: Cardio: 165 to 318 Sit to stand: 12 to 16 Right bicep curl ( 5 lbs): 16 to 19 Improvement in balance testing (single leg stand)  Kara Pacer Jamol Ginyard 11/10/2021, 10:12 AM

## 2021-11-26 ENCOUNTER — Other Ambulatory Visit: Payer: Self-pay | Admitting: Cardiology

## 2021-11-27 LAB — LIPID PANEL W/O CHOL/HDL RATIO
Cholesterol, Total: 209 mg/dL — ABNORMAL HIGH (ref 100–199)
HDL: 59 mg/dL (ref >39)
LDL Chol Calc (NIH): 136 mg/dL — ABNORMAL HIGH (ref 0–99)
Triglycerides: 78 mg/dL (ref 0–149)
VLDL Cholesterol Cal: 14 mg/dL (ref 5–40)

## 2021-11-27 LAB — HGB A1C W/O EAG: Hgb A1c MFr Bld: 5.9 % — ABNORMAL HIGH (ref 4.8–5.6)

## 2022-02-15 ENCOUNTER — Inpatient Hospital Stay: Admission: RE | Admit: 2022-02-15 | Payer: BLUE CROSS/BLUE SHIELD | Source: Ambulatory Visit

## 2022-02-28 ENCOUNTER — Ambulatory Visit: Payer: BLUE CROSS/BLUE SHIELD | Admitting: Family Medicine

## 2022-03-03 ENCOUNTER — Ambulatory Visit (HOSPITAL_COMMUNITY): Payer: No Typology Code available for payment source | Attending: Cardiology

## 2022-03-03 DIAGNOSIS — I340 Nonrheumatic mitral (valve) insufficiency: Secondary | ICD-10-CM | POA: Insufficient documentation

## 2022-03-03 LAB — ECHOCARDIOGRAM COMPLETE
Area-P 1/2: 3.65 cm2
MV M vel: 4.94 m/s
MV Peak grad: 97.6 mmHg
Radius: 0.6 cm
S' Lateral: 2.9 cm

## 2022-03-30 ENCOUNTER — Other Ambulatory Visit (HOSPITAL_COMMUNITY): Payer: BLUE CROSS/BLUE SHIELD

## 2022-04-01 ENCOUNTER — Ambulatory Visit (INDEPENDENT_AMBULATORY_CARE_PROVIDER_SITE_OTHER): Payer: No Typology Code available for payment source | Admitting: Cardiovascular Disease

## 2022-04-01 ENCOUNTER — Encounter (HOSPITAL_BASED_OUTPATIENT_CLINIC_OR_DEPARTMENT_OTHER): Payer: Self-pay | Admitting: Cardiovascular Disease

## 2022-04-01 VITALS — BP 116/76 | HR 63 | Ht 65.5 in | Wt 170.4 lb

## 2022-04-01 DIAGNOSIS — E669 Obesity, unspecified: Secondary | ICD-10-CM

## 2022-04-01 DIAGNOSIS — E7800 Pure hypercholesterolemia, unspecified: Secondary | ICD-10-CM | POA: Diagnosis not present

## 2022-04-01 DIAGNOSIS — I340 Nonrheumatic mitral (valve) insufficiency: Secondary | ICD-10-CM | POA: Diagnosis not present

## 2022-04-01 NOTE — Patient Instructions (Signed)
Medication Instructions:  ?Your physician recommends that you continue on your current medications as directed. Please refer to the Current Medication list given to you today.  ? ?*If you need a refill on your cardiac medications before your next appointment, please call your pharmacy* ? ?Lab Work: ?NONE ? ?Testing/Procedures: ?NONE ? ?Follow-Up: ?At [facility], you and your health needs are our priority.  As part of our continuing mission to provide you with exceptional heart care, we have created designated Provider Care Teams.  These Care Teams include your primary Cardiologist (physician) and Advanced Practice Providers (APPs -  Physician Assistants and Nurse Practitioners) who all work together to provide you with the care you need, when you need it. ? ?We recommend signing up for the patient portal called "MyChart".  Sign up information is provided on this After Visit Summary.  MyChart is used to connect with patients for Virtual Visits (Telemedicine).  Patients are able to view lab/test results, encounter notes, upcoming appointments, etc.  Non-urgent messages can be sent to your provider as well.   ?To learn more about what you can do with MyChart, go to [email or web address].   ? ?Your next appointment:   ?12 month(s) ? ?The format for your next appointment:   ?In Person ? ?Provider:   ?Tiffany McFarland, MD  ? ? ? ? ? ? ?

## 2022-04-01 NOTE — Progress Notes (Signed)
Cardiology Office Note:    Date:  04/01/2022   ID:  Megan Allen, DOB 27-Aug-1956, MRN [id number]  PCP:  [name], MD   [facility] Providers Cardiologist:  None     Referring MD: [name], MD   No chief complaint on file.    History of Present Illness:    Megan Allen is a 66 y.o. female with a hx of  moderate mitral regurgitation, here for follow-up. She was seen in the ED 12/2020 with chest pain. Cardiac enzymes were negative, and she was referred to cardiology as an outpatient. She saw Dr. Margaretann Loveless and was referred for a coronary CTA 01/2021 which was normal. Her calcium score was 0. She was also noted to have a murmur. She had an Echo 52022 with normal systolic function and moderate mitral regurgitation.  At her last appointment she was doing well.  Aspirin was stopped given her calcium score of 0.  She was referred to the PREP exercise program.  She has enjoyed the program and sticked with the lifestyle changes.  SHe is exercising on the elliptical and watching her diet.  She has lost 20 lb. She is limits fried foods and exercises at least every other day but sometimes every day.  She hasn't had any chest pain and her breathing has been stable.  She has no LE edema, orthopnea or PND.     Past Medical History:  Diagnosis Date   Atypical chest pain 05/06/2021   Chronic back pain    Headache    Moderate mitral regurgitation 05/06/2021   Murmur    Muscle spasm    Obesity (BMI 30.0-34.9) 05/06/2021   Pure hypercholesterolemia 05/06/2021    Past Surgical History:  Procedure Laterality Date   ABDOMINAL HYSTERECTOMY     partial   BREAST CYST ASPIRATION     COLONOSCOPY WITH PROPOFOL N/A 10/12/2020   Procedure: COLONOSCOPY WITH PROPOFOL;  Surgeon: Eloise Harman, DO;  [facility];  Service: Endoscopy;  Laterality: N/A;  12:45   POLYPECTOMY  10/12/2020   Procedure: POLYPECTOMY;  Surgeon: Eloise Harman, DO;  [facility];   Service: Endoscopy;;    Current Medications: No outpatient medications have been marked as taking for the 04/01/22 encounter (Appointment) with Skeet Latch, MD.     Allergies:   Bee venom and Caffeine   Social History   Socioeconomic History   Marital status: Married    Spouse name: Not on file   Number of children: 3   Years of education: some college   Highest education level: Not on file  Occupational History   Occupation: Hair Dresser  Tobacco Use   Smoking status: Never   Smokeless tobacco: Never  Vaping Use   Vaping Use: Never used  Substance and Sexual Activity   Alcohol use: No   Drug use: No   Sexual activity: Yes    Birth control/protection: Surgical  Other Topics Concern   Not on file  Social History Narrative   Lives at home with her husband.   Right-handed.   No caffeine per day.   Social Determinants of Health   Financial Resource Strain: Not on file  Food Insecurity: Not on file  Transportation Needs: Not on file  Physical Activity: Not on file  Stress: Not on file  Social Connections: Not on file     Family History: The patient's family history includes Diabetes in her brother and father; Heart disease in her daughter; Hyperlipidemia in  her mother; Stroke in her maternal grandfather.  ROS:   Please see the history of present illness.    (+) Central chest pain (+) Abdominal pain (+) Back pain (+) Stress All other systems reviewed and are negative.  EKGs/Labs/Other Studies Reviewed:    The following studies were reviewed today:  Echo 02/23/2021:  1. Eccentric posteriorly directed mitral regurgitation jet noted. MR is  at least moderate, but poorly visualized. Mitral inflow is A dominant and  LA size is normal. There is no overt AMVL prolapse. Pathology could be  related to restricted PMVL with  pseudo prolapse of the AMVL. The mitral valve is grossly normal. Moderate  mitral valve regurgitation. No evidence of mitral stenosis.   2.  Left ventricular ejection fraction, by estimation, is 60 to 65%. The  left ventricle has normal function. The left ventricle has no regional  wall motion abnormalities. Left ventricular diastolic parameters were  normal.   3. Right ventricular systolic function is normal. The right ventricular  size is normal. There is normal pulmonary artery systolic pressure. The  estimated right ventricular systolic pressure is 09.9 mmHg.   4. The aortic valve is tricuspid. Aortic valve regurgitation is not  visualized. No aortic stenosis is present.   5. The inferior vena cava is normal in size with greater than 50%  respiratory variability, suggesting right atrial pressure of 3 mmHg.   EKG:   04/01/22: Sinus rhythm.  Rate 63 bpm  Recent Labs: 09/02/2021: ALT 13; BUN 17; Creatinine, Ser 0.87; Hemoglobin 14.3; Platelets 194; Potassium 4.9; Sodium 140  Recent Lipid Panel    Component Value Date/Time   CHOL 209 (H) 11/26/2021 0230   TRIG 78 11/26/2021 0230   HDL 59 11/26/2021 0230   CHOLHDL 3.2 06/11/2020 1041   CHOLHDL 3.5 09/13/2016 1202   VLDL 12 09/13/2016 1202   LDLCALC 136 (H) 11/26/2021 0230        Physical Exam:    VS:  BP 116/76 (BP Location: Left Arm, Patient Position: Sitting, Cuff Size: Normal)   Pulse 63   Ht 5' 5.5" (1.664 m)   Wt 170 lb 6.4 oz (77.3 kg)   BMI 27.92 kg/m  , BMI Body mass index is 27.92 kg/m. GENERAL:  Well appearing HEENT: Pupils equal round and reactive, fundi not visualized, oral mucosa unremarkable NECK:  No jugular venous distention, waveform within normal limits, carotid upstroke brisk and symmetric, no bruits, no thyromegaly LUNGS:  Clear to auscultation bilaterally HEART:  RRR.  PMI not displaced or sustained,S1 and S2 within normal limits, no S3, no S4, no clicks, no rubs, no murmurs ABD:  Flat, positive bowel sounds normal in frequency in pitch, no bruits, no rebound, no guarding, no midline pulsatile mass, no hepatomegaly, no splenomegaly EXT:  2  plus pulses throughout, no edema, no cyanosis no clubbing SKIN:  No rashes no nodules NEURO:  Cranial nerves II through XII grossly intact, motor grossly intact throughout PSYCH:  Cognitively intact, oriented to person place and time   ASSESSMENT:    No diagnosis found.  PLAN:   No problem-specific Assessment & Plan notes found for this encounter.  Moderate mitral regurgitation Moderate mitral regurgitation on echo 02/2021. She has no heart failure symptoms.  Consider repeat echo in one year.     Obesity (BMI 30.0-34.9) Ms. Regas was praised for her excellent lifestyle interventions.  Her BMI decreased from 30-27.  She is doing a great job and encouraged to keep it up.   Pure hypercholesterolemia  ASCVD 10-year risk is 5.4%.  Keep working on diet and exercise.  Given her calcium score of 0 we are not starting her on a statin.   Atypical chest pain Resolved    In order of problems listed above:  Disposition: FU with Brayant Dorr C. Oval Linsey, MD, [facility] in 1 year.   Medication Adjustments/Labs and Tests Ordered: Current medicines are reviewed at length with the patient today.  Concerns regarding medicines are outlined above.  No orders of the defined types were placed in this encounter.   No orders of the defined types were placed in this encounter.    There are no Patient Instructions on file for this visit.     Signed, Skeet Latch, MD  04/01/2022 8:04 AM    [place]

## 2022-05-10 ENCOUNTER — Telehealth: Payer: Self-pay | Admitting: Family Medicine

## 2022-05-10 ENCOUNTER — Telehealth: Payer: Self-pay | Admitting: Emergency Medicine

## 2022-05-10 ENCOUNTER — Encounter: Payer: Self-pay | Admitting: Family Medicine

## 2022-05-10 ENCOUNTER — Ambulatory Visit: Payer: No Typology Code available for payment source | Attending: Family Medicine | Admitting: Family Medicine

## 2022-05-10 VITALS — BP 109/69 | HR 83 | Ht 65.5 in | Wt 168.0 lb

## 2022-05-10 DIAGNOSIS — G8929 Other chronic pain: Secondary | ICD-10-CM | POA: Diagnosis not present

## 2022-05-10 DIAGNOSIS — E785 Hyperlipidemia, unspecified: Secondary | ICD-10-CM | POA: Insufficient documentation

## 2022-05-10 DIAGNOSIS — E7849 Other hyperlipidemia: Secondary | ICD-10-CM | POA: Diagnosis not present

## 2022-05-10 DIAGNOSIS — M5441 Lumbago with sciatica, right side: Secondary | ICD-10-CM

## 2022-05-10 NOTE — Telephone Encounter (Signed)
Patient  has requested to see if the provider would put in an order for a blood glucose testing machine so she can test it at home and the insurance would pay for it. The patient uses  [facility] Phone:  [phone]  Fax:  [phone]     Please assist patient further

## 2022-05-10 NOTE — Patient Instructions (Signed)

## 2022-05-10 NOTE — Telephone Encounter (Signed)
Copied from [place] [phone]. Topic: Referral - Question >> May 10, 2022  4:12 PM Erskine Squibb wrote: Reason for CRM: Patient called in stating she was hoping that the Physical Therapy referral was to the location on [address] as she has heard good things about it. The patient originally discussed  another location but she prefers the [place] location if possible. If not she will stay with the other location. Please assist patient further.

## 2022-05-10 NOTE — Progress Notes (Signed)
Subjective:  Patient ID: Megan Allen, female    DOB: Nov 21, 1955  Age: 66 y.o. MRN: [id number]  CC: Back Pain   HPI Megan Allen is a 66 y.o. year old female with a history of chronic low back pain who presents today for an office visit.  Interval History: She had a visit with cardiology last month to follow-up on her mitral regurg and notes reviewed with recommendation to follow-up in 1 year.   She has lost 16 pounds in the last 1 year and [facility] Her cholesterol was elevated in 11/2021 and she never commenced Atorvastatin as she wanted to work on lifestyle.  Her back hurts from time to time and she exercises regularly.  She does not like to take medication so she just uses topical regimens.  Pain radiates down both legs intermittently. Past Medical History:  Diagnosis Date   Atypical chest pain 05/06/2021   Chronic back pain    Headache    Moderate mitral regurgitation 05/06/2021   Murmur    Muscle spasm    Obesity (BMI 30.0-34.9) 05/06/2021   Pure hypercholesterolemia 05/06/2021    Past Surgical History:  Procedure Laterality Date   ABDOMINAL HYSTERECTOMY     partial   BREAST CYST ASPIRATION     COLONOSCOPY WITH PROPOFOL N/A 10/12/2020   Procedure: COLONOSCOPY WITH PROPOFOL;  Surgeon: Eloise Harman, DO;  [facility];  Service: Endoscopy;  Laterality: N/A;  12:45   POLYPECTOMY  10/12/2020   Procedure: POLYPECTOMY;  Surgeon: Eloise Harman, DO;  [facility];  Service: Endoscopy;;    Family History  Problem Relation Age of Onset   Hyperlipidemia Mother    Diabetes Father    Diabetes Brother    Heart disease Daughter    Stroke Maternal Grandfather     Social History   Socioeconomic History   Marital status: Married    Spouse name: Not on file   Number of children: 3   Years of education: some college   Highest education level: Not on file  Occupational History   Occupation: Hair  Dresser  Tobacco Use   Smoking status: Never   Smokeless tobacco: Never  Vaping Use   Vaping Use: Never used  Substance and Sexual Activity   Alcohol use: No   Drug use: No   Sexual activity: Yes    Birth control/protection: Surgical  Other Topics Concern   Not on file  Social History Narrative   Lives at home with her husband.   Right-handed.   No caffeine per day.   Social Determinants of Health   Financial Resource Strain: Not on file  Food Insecurity: Not on file  Transportation Needs: Not on file  Physical Activity: Not on file  Stress: Not on file  Social Connections: Not on file    Allergies  Allergen Reactions   Bee Venom    Caffeine     Chest pain, heart racing    Outpatient Medications Prior to Visit  Medication Sig Dispense Refill   acetaminophen (TYLENOL) 650 MG CR tablet Take 650 mg by mouth as needed for pain.     Ascorbic Acid (VITAMIN C) 1000 MG tablet Take 1,000 mg by mouth daily.     atorvastatin (LIPITOR) 20 MG tablet Take 1 tablet (20 mg total) by mouth daily. 30 tablet 6   bisacodyl (DULCOLAX) 5 MG EC tablet Take 5 mg by mouth daily as needed for  moderate constipation.     Calcium Carb-Cholecalciferol 600-800 MG-UNIT TABS Take 1 tablet by mouth daily.     Cholecalciferol (VITAMIN D3 PO) Take 2,000 Units by mouth daily.     Cyanocobalamin (VITAMIN B-12 PO) Take 3,000 mcg by mouth daily.     Diclofenac Sodium 2 % SOLN Place onto the skin daily.     ECHINACEA PO Take 400 mg by mouth as needed.     EPINEPHrine 0.3 mg/0.3 mL IJ SOAJ injection Inject 0.3 mLs (0.3 mg total) into the muscle as needed for anaphylaxis (severe allergic reactions from bee or other insect stings resulting in shortness of breath). 1 each 0   famotidine (PEPCID) 40 MG tablet Take 1 tablet (40 mg total) by mouth daily. For five days. 5 tablet 0   methocarbamol (ROBAXIN) 750 MG tablet Take 1 tablet (750 mg total) by mouth every 8 (eight) hours as needed for muscle spasms. 90 tablet  1   Multiple Vitamin (MULTIVITAMIN) capsule Take 1 capsule by mouth daily.     UNABLE TO FIND Take 1 tablet by mouth as needed. OTC Calms for simple nervous tension and occasional sleeplessness.     meclizine (ANTIVERT) 12.5 MG tablet Take 1 tablet (12.5 mg total) by mouth 3 (three) times daily as needed for dizziness. (Patient taking differently: Take 12.5 mg by mouth as needed for dizziness.) 10 tablet 0   omeprazole (PRILOSEC) 20 MG capsule Take 1 capsule (20 mg total) by mouth 2 (two) times daily before a meal. (Patient taking differently: Take 20 mg by mouth as needed.) 28 capsule 0   SUMAtriptan (IMITREX) 50 MG tablet Take 1 tab at onset of migraine.  May repeat in 2 hrs, if needed.  Max dose: 2 tabs/day. This is a 30 day prescription. (Patient taking differently: as needed. Take 1 tab at onset of migraine.  May repeat in 2 hrs, if needed.  Max dose: 2 tabs/day. This is a 30 day prescription.) 12 tablet 6   No facility-administered medications prior to visit.     ROS Review of Systems  Constitutional:  Negative for activity change and appetite change.  HENT:  Negative for sinus pressure and sore throat.   Respiratory:  Negative for chest tightness, shortness of breath and wheezing.   Cardiovascular:  Negative for chest pain and palpitations.  Gastrointestinal:  Negative for abdominal distention, abdominal pain and constipation.  Genitourinary: Negative.   Musculoskeletal:        See HPI  Psychiatric/Behavioral:  Negative for behavioral problems and dysphoric mood.     Objective:  BP 109/69   Pulse 83   Ht 5' 5.5" (1.664 m)   Wt 168 lb (76.2 kg)   SpO2 99%   BMI 27.53 kg/m      05/10/2022    3:20 PM 04/01/2022    8:09 AM 11/09/2021    3:00 PM  BP/Weight  Systolic BP 330 076 98  Diastolic BP 69 76 68  Wt. (Lbs) 168 170.4 184  BMI 27.53 kg/m2 27.92 kg/m2 30.15 kg/m2      Physical Exam Constitutional:      Appearance: She is well-developed.  Cardiovascular:     Rate  and Rhythm: Normal rate.     Heart sounds: Normal heart sounds. No murmur heard. Pulmonary:     Effort: Pulmonary effort is normal.     Breath sounds: Normal breath sounds. No wheezing or rales.  Chest:     Chest wall: No tenderness.  Abdominal:  General: Bowel sounds are normal. There is no distension.     Palpations: Abdomen is soft. There is no mass.     Tenderness: There is no abdominal tenderness.  Musculoskeletal:        General: Normal range of motion.     Right lower leg: No edema.     Left lower leg: No edema.     Comments: Neg straight leg raise bilaterally  Neurological:     Mental Status: She is alert and oriented to person, place, and time.  Psychiatric:        Mood and Affect: Mood normal.        Latest Ref Rng & Units 09/02/2021   10:54 AM 01/11/2021    4:28 PM 05/03/2020    1:44 PM  CMP  Glucose 70 - 99 mg/dL 90  90  128   BUN 8 - 27 mg/dL 17  14  28    Creatinine 0.57 - 1.00 mg/dL 0.87  0.77  0.79   Sodium 134 - 144 mmol/L 140  138  137   Potassium 3.5 - 5.2 mmol/L 4.9  3.7  4.2   Chloride 96 - 106 mmol/L 101  104  102   CO2 20 - 29 mmol/L 26  25  25    Calcium 8.7 - 10.3 mg/dL 9.6  9.2  8.9   Total Protein 6.0 - 8.5 g/dL 6.7   6.7   Total Bilirubin 0.0 - 1.2 mg/dL 0.3   0.2   Alkaline Phos 44 - 121 IU/L 83   63   AST 0 - 40 IU/L 15   18   ALT 0 - 32 IU/L 13   18     Lipid Panel     Component Value Date/Time   CHOL 209 (H) 11/26/2021 0230   TRIG 78 11/26/2021 0230   HDL 59 11/26/2021 0230   CHOLHDL 3.2 06/11/2020 1041   CHOLHDL 3.5 09/13/2016 1202   VLDL 12 09/13/2016 1202   LDLCALC 136 (H) 11/26/2021 0230    CBC    Component Value Date/Time   WBC 5.4 09/02/2021 1054   WBC 6.9 01/11/2021 1628   RBC 4.74 09/02/2021 1054   RBC 4.39 01/11/2021 1628   HGB 14.3 09/02/2021 1054   HCT 42.8 09/02/2021 1054   PLT 194 09/02/2021 1054   MCV 90 09/02/2021 1054   MCH 30.2 09/02/2021 1054   MCH 31.0 01/11/2021 1628   MCHC 33.4 09/02/2021 1054    MCHC 33.4 01/11/2021 1628   RDW 13.2 09/02/2021 1054   LYMPHSABS 1.9 09/02/2021 1054   MONOABS 448 09/13/2016 1202   EOSABS 0.1 09/02/2021 1054   BASOSABS 0.0 09/02/2021 1054    Lab Results  Component Value Date   HGBA1C 5.9 (H) 11/26/2021    Assessment & Plan:  1. Other Hyperlipidemia Currently not on a statin as she has been working on lifestyle Low cholesterol diet - CMP14+EGFR; Future - LP+Non-HDL Cholesterol; Future  2. Chronic bilateral low back pain with right-sided sciatica Uncontrolled Declines oral medical but prefers topical analgesics which she has at home Advised to apply heat or ice whichever is tolerated to painful areas. Counseled on evidence of improvement in pain control with regards to yoga, water aerobics, massage, home physical therapy, exercise as tolerated. - Ambulatory referral to Physical Therapy    No orders of the defined types were placed in this encounter.   Follow-up: Return in about 1 year (around 05/11/2023) for Chronic medical conditions.  [name], MD, FAAFP. [facility] and [place], [place]   05/10/2022, 4:06 PM

## 2022-05-11 ENCOUNTER — Other Ambulatory Visit: Payer: Self-pay

## 2022-05-11 MED ORDER — TRUEPLUS LANCETS 28G MISC
2 refills | Status: DC
  Filled 2022-05-11: qty 100, 90d supply, fill #0

## 2022-05-11 MED ORDER — TRUE METRIX BLOOD GLUCOSE TEST VI STRP
ORAL_STRIP | 2 refills | Status: DC
  Filled 2022-05-11: qty 100, 90d supply, fill #0

## 2022-05-11 MED ORDER — TRUE METRIX METER W/DEVICE KIT
PACK | 0 refills | Status: DC
  Filled 2022-05-11: qty 1, fill #0
  Filled 2022-05-11: qty 1, 30d supply, fill #0

## 2022-05-11 NOTE — Telephone Encounter (Signed)
Rx for testing supplies sent.

## 2022-05-13 ENCOUNTER — Other Ambulatory Visit: Payer: No Typology Code available for payment source

## 2022-05-13 ENCOUNTER — Ambulatory Visit: Payer: No Typology Code available for payment source | Attending: Family Medicine

## 2022-05-13 DIAGNOSIS — E7849 Other hyperlipidemia: Secondary | ICD-10-CM | POA: Diagnosis not present

## 2022-05-14 LAB — CMP14+EGFR
ALT: 15 IU/L (ref 0–32)
AST: 18 IU/L (ref 0–40)
Albumin/Globulin Ratio: 2.3 — ABNORMAL HIGH (ref 1.2–2.2)
Albumin: 4.5 g/dL (ref 3.9–4.9)
Alkaline Phosphatase: 97 IU/L (ref 44–121)
BUN/Creatinine Ratio: 19 (ref 12–28)
BUN: 15 mg/dL (ref 8–27)
Bilirubin Total: 0.3 mg/dL (ref 0.0–1.2)
CO2: 24 mmol/L (ref 20–29)
Calcium: 9.6 mg/dL (ref 8.7–10.3)
Chloride: 102 mmol/L (ref 96–106)
Creatinine, Ser: 0.79 mg/dL (ref 0.57–1.00)
Globulin, Total: 2 g/dL (ref 1.5–4.5)
Glucose: 82 mg/dL (ref 70–99)
Potassium: 4 mmol/L (ref 3.5–5.2)
Sodium: 140 mmol/L (ref 134–144)
Total Protein: 6.5 g/dL (ref 6.0–8.5)
eGFR: 83 mL/min/1.73

## 2022-05-14 LAB — LP+NON-HDL CHOLESTEROL
Cholesterol, Total: 266 mg/dL — ABNORMAL HIGH (ref 100–199)
HDL: 63 mg/dL
LDL Chol Calc (NIH): 195 mg/dL — ABNORMAL HIGH (ref 0–99)
Total Non-HDL-Chol (LDL+VLDL): 203 mg/dL — ABNORMAL HIGH (ref 0–129)
Triglycerides: 53 mg/dL (ref 0–149)
VLDL Cholesterol Cal: 8 mg/dL (ref 5–40)

## 2022-05-17 ENCOUNTER — Other Ambulatory Visit: Payer: Self-pay

## 2022-05-17 ENCOUNTER — Telehealth: Payer: Self-pay

## 2022-05-17 DIAGNOSIS — E7849 Other hyperlipidemia: Secondary | ICD-10-CM

## 2022-05-17 NOTE — Telephone Encounter (Signed)
Pt given lab results per notes of Dr. Margarita Rana on 05/16/22. Pt verbalized understanding. She says she would like to see a dietician to give her guidance on the right foods to eat to lower her cholesterol and to help lower her pre-diabetes. She says she would rather try this and lower the numbers on her on than starting on medication. Advised I will send this to Dr. Margarita Rana and someone will call with her recommendation, patient verbalized understanding.    [name], MD  05/16/2022  8:30 AM EDT Back to Top    Please inform her that her cholesterol is elevated higher than it has been over the last 4 years. My recommendation would be that she commence a statin which she has been resistant to in the past. I am happy to send her a Prescription to the pharmacy.

## 2022-05-19 ENCOUNTER — Telehealth: Payer: Self-pay | Admitting: Family Medicine

## 2022-05-19 ENCOUNTER — Other Ambulatory Visit: Payer: Self-pay

## 2022-05-19 ENCOUNTER — Telehealth: Payer: Self-pay | Admitting: Emergency Medicine

## 2022-05-19 DIAGNOSIS — Z23 Encounter for immunization: Secondary | ICD-10-CM | POA: Diagnosis not present

## 2022-05-19 MED ORDER — ONETOUCH DELICA PLUS LANCET33G MISC: 12 refills | Status: DC

## 2022-05-19 MED ORDER — ONETOUCH VERIO W/DEVICE KIT: PACK | 0 refills | Status: DC

## 2022-05-19 MED ORDER — ONETOUCH VERIO VI STRP: ORAL_STRIP | 12 refills | Status: DC

## 2022-05-19 MED ORDER — ATORVASTATIN CALCIUM 20 MG PO TABS: 20.0000 mg | ORAL_TABLET | Freq: Every day | ORAL | 6 refills | Status: DC

## 2022-05-19 NOTE — Telephone Encounter (Signed)
Pt called back asking about the PT referral.  She hasn't heard anything back.  CB@ [phone]

## 2022-05-19 NOTE — Telephone Encounter (Signed)
Copied from [place] [id number]. Topic: Referral - Question >> May 19, 2022  1:35 PM Rosanne Ashing P wrote: She also asked about nutrition classes

## 2022-05-19 NOTE — Addendum Note (Signed)
Addended byCharlott Rakes on: 05/19/2022 03:06 PM   Modules accepted: Orders

## 2022-05-19 NOTE — Telephone Encounter (Signed)
Patient has been informed of lab results and medication was sent to pharmacy. Pt has been informed of referral being placed.

## 2022-05-19 NOTE — Telephone Encounter (Signed)
Please see referral information in chart. Thanks.

## 2022-05-19 NOTE — Telephone Encounter (Signed)
Script has been sent.

## 2022-05-19 NOTE — Telephone Encounter (Signed)
Patient request new Rx for covered meter:  One Touch /VeFlex. Request sent to office

## 2022-05-19 NOTE — Telephone Encounter (Signed)
Pt called saying her insurance would not cover the diabetic meter that was ordered.  They gave her the name One Touch / Ve Flex.  This is what her insurance told her to have her doctor order  [facility] [address]. Carolan Shiver  [place]@ [phone]

## 2022-05-19 NOTE — Telephone Encounter (Signed)
Done

## 2022-05-19 NOTE — Telephone Encounter (Signed)
Pt has been informed.

## 2022-05-20 ENCOUNTER — Other Ambulatory Visit: Payer: Self-pay

## 2022-06-16 ENCOUNTER — Other Ambulatory Visit: Payer: Self-pay | Admitting: Family Medicine

## 2022-06-16 DIAGNOSIS — Z1231 Encounter for screening mammogram for malignant neoplasm of breast: Secondary | ICD-10-CM

## 2022-06-20 ENCOUNTER — Ambulatory Visit (HOSPITAL_BASED_OUTPATIENT_CLINIC_OR_DEPARTMENT_OTHER): Payer: No Typology Code available for payment source | Attending: Family Medicine | Admitting: Physical Therapy

## 2022-06-20 ENCOUNTER — Ambulatory Visit: Payer: Self-pay

## 2022-06-20 ENCOUNTER — Other Ambulatory Visit: Payer: Self-pay

## 2022-06-20 DIAGNOSIS — M5416 Radiculopathy, lumbar region: Secondary | ICD-10-CM | POA: Diagnosis not present

## 2022-06-20 DIAGNOSIS — M5441 Lumbago with sciatica, right side: Secondary | ICD-10-CM | POA: Insufficient documentation

## 2022-06-20 DIAGNOSIS — R2689 Other abnormalities of gait and mobility: Secondary | ICD-10-CM | POA: Insufficient documentation

## 2022-06-20 DIAGNOSIS — G8929 Other chronic pain: Secondary | ICD-10-CM | POA: Insufficient documentation

## 2022-06-20 DIAGNOSIS — M5459 Other low back pain: Secondary | ICD-10-CM | POA: Diagnosis not present

## 2022-06-20 NOTE — Telephone Encounter (Signed)
Chief Complaint: Atorvastatin, should continue taking, if so will need a refill Symptoms: No side effects, no symptoms Frequency: Started on 05/19/22 Pertinent Negatives: Patient denies symptoms Disposition: '[]'$ ED /'[]'$ Urgent Care (no appt availability in office) / '[x]'$ Appointment(In office/virtual)/ '[]'$  [place] [facility]/ '[]'$ Home Care/ '[]'$ Refused Recommended Disposition /'[]'$ [place] [facility]/ '[]'$  Follow-up with PCP Additional Notes: Patient took 30 days of Atorvastatin with the last pill last night. She's asking is she supposed to continue taking, if so she will need a refill. Advised per lab results to continue taking, [facility], advised to call the pharmacy for a refill. She asked about retesting of cholesterol, advised no mention of retesting in the OV notes, only to return in 1 year around 05/11/23. Appointment scheduled for 05/17/23 for CPE. Advised I will send this to Dr. Margarita Rana asking if a retest of cholesterol is needed prior to next years physical, she verbalized understanding.   Summary: Med management.   Pt stated PCP started her on cholesterol medication (pt doesn't know the name of it) Pt stated she has no more medication and is unsure if she needs a refill or if she doesn't need to take anymore.  Pt stated she is out of medication as of last night.    Pt seeking clinical advice.      Reason for Disposition  Caller has medicine question only, adult not sick, AND triager answers question  Answer Assessment - Initial Assessment Questions 1. NAME of MEDICINE: "What medicine(s) are you calling about?"     Atorvastain 2. QUESTION: "What is your question?" (e.g., double dose of medicine, side effect)     No refills, should I continue taking, if so I need a refill 3. PRESCRIBER: "Who prescribed the medicine?" Reason: if prescribed by specialist, call should be referred to that group.     Dr. Margarita Rana 4. SYMPTOMS: "Do you have any symptoms?" If  Yes, ask: "What symptoms are you having?"  "How bad are the symptoms (e.g., mild, moderate, severe)     None 5. PREGNANCY:  "Is there any chance that you are pregnant?" "When was your last menstrual period?"     N/A  Protocols used: Medication Question Call-A-AH

## 2022-06-20 NOTE — Telephone Encounter (Signed)
Patient aware that Rx is at pharmacy. Given phone number to call [facility] regarding retest for Lipid panel.

## 2022-06-20 NOTE — Therapy (Signed)
OUTPATIENT PHYSICAL THERAPY THORACOLUMBAR Evaluation    Patient Name: Megan Allen MRN: [id number] DOB:30-Apr-1956, 66 y.o., female Today's Date: 06/20/2022   PT End of Session - 06/20/22 1216     Visit Number 1    Number of Visits 6    Date for PT Re-Evaluation 08/01/22    PT Start Time 1010    PT Stop Time 1100    PT Time Calculation (min) 50 min    Activity Tolerance Patient tolerated treatment well    Behavior During Therapy [facility] for tasks assessed/performed             Past Medical History:  Diagnosis Date   Atypical chest pain 05/06/2021   Chronic back pain    Headache    Moderate mitral regurgitation 05/06/2021   Murmur    Muscle spasm    Obesity (BMI 30.0-34.9) 05/06/2021   Pure hypercholesterolemia 05/06/2021   Past Surgical History:  Procedure Laterality Date   ABDOMINAL HYSTERECTOMY     partial   BREAST CYST ASPIRATION     COLONOSCOPY WITH PROPOFOL N/A 10/12/2020   Procedure: COLONOSCOPY WITH PROPOFOL;  Surgeon: Eloise Harman, DO;  [facility];  Service: Endoscopy;  Laterality: N/A;  12:45   POLYPECTOMY  10/12/2020   Procedure: POLYPECTOMY;  Surgeon: Eloise Harman, DO;  Location: [facility];  Service: Endoscopy;;   Patient Active Problem List   Diagnosis Date Noted   Hyperlipidemia 05/10/2022   Moderate mitral regurgitation 05/06/2021   Obesity (BMI 30.0-34.9) 05/06/2021   Pure hypercholesterolemia 05/06/2021   Atypical chest pain 05/06/2021   Insect bite of left thigh 06/25/2020   Chronic migraine 05/29/2018   Chronic nonintractable headache 01/30/2018   Blurry vision 01/30/2018   Back pain 10/13/2016   Pain in joint, shoulder region 09/13/2016   Pain in joint, ankle and foot 09/13/2016    PCP: Dr Rosalita Chessman   REFERRING PROVIDER: Rosalita Chessman   REFERRING DIAG: Y80.16,P53.74 (ICD-10-CM) - Chronic bilateral low back pain with right-sided sciatica  Rationale for Evaluation and Treatment  Rehabilitation  THERAPY DIAG:  Other low back pain  Radiculopathy, lumbar region  Other abnormalities of gait and mobility  ONSET DATE: 2016 following a fall   SUBJECTIVE:                                                                                                                                                                                           SUBJECTIVE STATEMENT: The patient fell in 2016 on her right side. She has had right sided low back pain since that point. She has increased pain when she is standing  and walking. She can lie down and it improves. She can lie on her back to decrease the pain. She can use heat or her bed massager to reduce her pain. She also had an ankle issue following the fall.   PERTINENT HISTORY:  Atypical chest pain; headache, Mitral valve regurgitation; muscle spasm, obesity ( has lost 16 pounds)   PAIN:  Are you having pain? Yes: NPRS scale: 5-6/10 at worst and right now  Pain location: start on the right side of her lower back and down the lateral leg.  Pain description: acing Aggravating factors: standing and walking Relieving factors: rest    PRECAUTIONS: None  WEIGHT BEARING RESTRICTIONS No  FALLS:  Has patient fallen in last 6 months? No  LIVING ENVIRONMENT: Has steps and stairs into her house  OCCUPATION: retired  Hobbies: exercising   PLOF: Independent  PATIENT GOALS :  To have less pain in her back    OBJECTIVE:   DIAGNOSTIC FINDINGS:  X-ray: No evidence of acute fracture malalignment of the lumbar spine.   Degenerative disc disease most advanced at L5-S1, with associated facet disease.  PATIENT SURVEYS:  FOTO    SCREENING FOR RED FLAGS: Bowel or bladder incontinence: No Spinal tumors: No Cauda equina syndrome: No Compression fracture: No checked after her fall but nothing  Abdominal aneurysm: No  COGNITION:  Overall cognitive status: Within functional limits for tasks  assessed     SENSATION: WFL  POSTURE: No Significant postural limitations  PALPATION: Significant trigger point in upper gluteal area and in lower lumbar spine area,  LUMBAR ROM:   Active  A/PROM  eval  Flexion Full ROM   Extension Mild pain   Right lateral flexion No pain  Left lateral flexion No pain   Right rotation Mild pain at end range  Left rotation Mild pain at end range    (Blank rows = not tested)  LOWER EXTREMITY ROM:     Active  Right eval Left eval  Hip flexion Pain with flexion passed 105  full  Hip extension    Hip abduction    Hip adduction    Hip internal rotation Painful towards end range Full   Hip external rotation    Knee flexion    Knee extension    Ankle dorsiflexion    Ankle plantarflexion    Ankle inversion    Ankle eversion     (Blank rows = not tested)  LOWER EXTREMITY MMT:    MMT Right eval Left eval  Hip flexion 10.7 18.2  Hip extension    Hip abduction 19.6 23.9  Hip adduction    Hip internal rotation    Hip external rotation    Knee flexion    Knee extension 11.4 20.8  Ankle dorsiflexion    Ankle plantarflexion    Ankle inversion    Ankle eversion     (Blank rows = not tested)     GAIT: Right hip drop in single leg stance; decreased hip flexion on the left    TODAY'S TREATMENT    Exercises - Supine Lower Trunk Rotation  - 1 x daily - 7 x weekly - 3 sets - 10 reps - Supine Transversus Abdominis Bracing - Hands on Stomach  - 1 x daily - 7 x weekly - 3 sets - 10 reps - Supine Piriformis Stretch with Foot on Ground  - 1 x daily - 7 x weekly - 3 sets - 3 reps - 20 hold - Standing Glute Med  Mobilization with Small Ball on Wall  - 1 x daily - 7 x weekly - 3 sets - 10 reps   PATIENT EDUCATION:  Education details: HEP/ symptom management/ progression of activity Person educated: Patient Education method: Explanation, Demonstration, Tactile cues, Verbal cues, and Handouts Education comprehension: verbalized  understanding, returned demonstration, verbal cues required, tactile cues required, and needs further education   HOME EXERCISE PROGRAM: Access Code: [email or web address] URL: [email or web address] Date: 06/21/2022 Prepared by: Carolyne Littles  ASSESSMENT:  CLINICAL IMPRESSION: Patient is a 66 y.o. female who was seen today for physical therapy evaluation and treatment for right sided low back pain. She presents with decreased strength with hip flexion and knee extension on the right. She has a large trigger point in her gluteal and lower lumbar spine. She has good lumbar mobility. She would benefit from sed therapy for dry needling, aqua therapy, and to build a core/ right LE strengthening program to reduce radicular symptoms.    OBJECTIVE IMPAIRMENTS decreased activity tolerance, difficulty walking, decreased ROM, decreased strength, and pain.   ACTIVITY LIMITATIONS bending, standing, stairs, transfers, and locomotion level  PARTICIPATION LIMITATIONS: meal prep, cleaning, laundry, shopping, community activity, and yard work  PERSONAL FACTORS 3+ comorbidities: chronic headaches; mitral valve regurgitation   are also affecting patient's functional outcome.   REHAB POTENTIAL: Good  CLINICAL DECISION MAKING: Evolving/moderate complexity  EVALUATION COMPLEXITY: Moderate   GOALS: Goals reviewed with patient? Yes  SHORT TERM GOALS: Target date: 07/11/2022  Patient will increase gross leg strength by 5 lbs bilateral  Baseline: Goal status: INITIAL  2.  The patient will increase her hip IR and flexion to full and pain free on the right side.  Baseline:  Goal status: INITIAL  3.  The patient will be independent with base stretching and exercise program Baseline:  Goal status: INITIAL   LONG TERM GOALS: Target date: 08/01/2022  The patient will stand for 1 hour without increased pain in order to perform ADL's  Baseline:  Goal status: INITIAL  2.  Patient will ambulate  community distances without radicular pain  Baseline:  Goal status: INITIAL  3.  Patient will have complete program for low back management withiut  Baseline:  Goal status: INITIAL   PLAN: PT FREQUENCY: 1x/week  PT DURATION: 6 weeks  PLANNED INTERVENTIONS: Therapeutic exercises, Therapeutic activity, Neuromuscular re-education, Balance training, Patient/Family education, Self Care, Joint mobilization, Dry Needling, Electrical stimulation, Spinal mobilization, Cryotherapy, Moist heat, Ultrasound, and Manual therapy.  PLAN FOR NEXT SESSION: assess tolerance to HEP; review basic table strengthening exercises; advance to gym exercises as tolerated. Consider manual therapy and dry needling PRN.    Carney Living, PT 06/20/2022, 12:17 PM

## 2022-06-21 ENCOUNTER — Encounter (HOSPITAL_BASED_OUTPATIENT_CLINIC_OR_DEPARTMENT_OTHER): Payer: Self-pay | Admitting: Physical Therapy

## 2022-06-27 ENCOUNTER — Ambulatory Visit: Payer: No Typology Code available for payment source | Admitting: Nutrition

## 2022-06-28 ENCOUNTER — Encounter: Payer: No Typology Code available for payment source | Attending: Family Medicine | Admitting: Nutrition

## 2022-06-28 ENCOUNTER — Encounter: Payer: Self-pay | Admitting: Nutrition

## 2022-06-28 DIAGNOSIS — E782 Mixed hyperlipidemia: Secondary | ICD-10-CM | POA: Insufficient documentation

## 2022-06-28 DIAGNOSIS — R7303 Prediabetes: Secondary | ICD-10-CM | POA: Insufficient documentation

## 2022-06-28 NOTE — Patient Instructions (Signed)
Goals  Choose egg whites only. Avoid yolks. Increase plant based foods of fruits and vegetables. Increase water to 4 bottles per day Cut out bacon and sausage. Keep up exercising daily for 30 mins to an hour.

## 2022-06-28 NOTE — Progress Notes (Signed)
Medical Nutrition Therapy  Appointment Start time:  0900  Appointment End time:  1000  Primary concerns today: Pre DM and Hyperlipidemia  Referral diagnosis: E78.0,  Preferred learning style: Visual and hear  Learning readiness: Change in progress    NUTRITION ASSESSMENT  66 yr old bfemale with history of GDM and now has pre dm and hyperlipemia Has been working on [employer] and has lost about 20 lbs in the last 9 months.Her highest weight was 210 lbs. She has cut out a lot of processed foods, sweets, desserts and junk food.  She is doing ellipical 3-4 times per week for 30 minutes at at time and does hand weights also.  Just started on PT for sciatic nerve pain. She is working on doing the exercises given at home too.  She is motivated and willing to work with lifestyle medicine to help reverse and improve her medical conditions of Hyperlipidemia, Pre DM and overall health.  Anthropometrics  Wt Readings from Last 3 Encounters:  06/28/22 161 lb (73 kg)  05/10/22 168 lb (76.2 kg)  04/01/22 170 lb 6.4 oz (77.3 kg)   Ht Readings from Last 3 Encounters:  06/28/22 5' 6.5" (1.689 m)  05/10/22 5' 5.5" (1.664 m)  04/01/22 5' 5.5" (1.664 m)   Body mass index is 25.6 kg/m. '@BMIFA'$ @ Facility age limit for growth %iles is 20 years. Facility age limit for growth %iles is 20 years.    Clinical Medical Hx: Hyperlipidemia, Pre DM, GERD Medications: see chart Labs:  Lab Results  Component Value Date   HGBA1C 5.9 (H) 11/26/2021      Latest Ref Rng & Units 05/13/2022   11:06 AM 09/02/2021   10:54 AM 01/11/2021    4:28 PM  CMP  Glucose 70 - 99 mg/dL 82  90  90   BUN 8 - 27 mg/dL '15  17  14   '$ Creatinine 0.57 - 1.00 mg/dL 0.79  0.87  0.77   Sodium 134 - 144 mmol/L 140  140  138   Potassium 3.5 - 5.2 mmol/L 4.0  4.9  3.7   Chloride 96 - 106 mmol/L 102  101  104   CO2 20 - 29 mmol/L '24  26  25   '$ Calcium 8.7 - 10.3 mg/dL 9.6  9.6  9.2   Total Protein 6.0 - 8.5 g/dL 6.5  6.7     Total Bilirubin 0.0 - 1.2 mg/dL 0.3  0.3    Alkaline Phos 44 - 121 IU/L 97  83    AST 0 - 40 IU/L 18  15    ALT 0 - 32 IU/L 15  13     Lipid Panel     Component Value Date/Time   CHOL 266 (H) 05/13/2022 1106   TRIG 53 05/13/2022 1106   HDL 63 05/13/2022 1106   CHOLHDL 3.2 06/11/2020 1041   CHOLHDL 3.5 09/13/2016 1202   VLDL 12 09/13/2016 1202   LDLCALC 195 (H) 05/13/2022 1106   LABVLDL 8 05/13/2022 1106    Notable Signs/Symptoms: None  Lifestyle & Dietary Hx Married and lives with her son and husband. She does the cooking and shopping in the home.  Estimated daily fluid intake: 45 oz Supplements: VIt D 3, Calcium Sleep: good Stress / self-care: none Current average weekly physical activity: works out 3 times per week on ellipical and hand weights.  24-Hr Dietary Recall First Meal: Water, 1 slice bacon and egg wrap, peaches and oatmeal,  Snack: nuts Second Meal: Smoked  chicken, water Snack: nuts Third Meal: okra, smoked chicken, water Snack:  Beverages: water  Estimated Energy Needs Calories: 1500 Carbohydrate: 170g Protein: 112g Fat: 42g   NUTRITION DIAGNOSIS  NI-5.6.2 Excessive fat intake As related to Diet high in processed food previously.  As evidenced by TCHOL 266 mg/dl, Total NON HDL Chol 203 mg/dl and LDL 195 mg/dl..   NUTRITION INTERVENTION  Nutrition education (E-1) on the following topics:  Lifestyle Medicine  - Whole Food, Plant Predominant Nutrition is highly recommended: Eat Plenty of vegetables, Mushrooms, fruits, Legumes, Whole Grains, Nuts, seeds in lieu of processed meats, processed snacks/pastries red meat, poultry, eggs.    -It is better to avoid simple carbohydrates including: Cakes, Sweet Desserts, Ice Cream, Soda (diet and regular), Sweet Tea, Candies, Chips, Cookies, Store Bought Juices, Alcohol in Excess of  1-2 drinks a day, Lemonade,  Artificial Sweeteners, Doughnuts, Coffee Creamers, "Sugar-free" Products, etc, etc.  This is not a  complete list.....  Exercise: If you are able: 30 -60 minutes a day ,4 days a week, or 150 minutes a week.  The longer the better.  Combine stretch, strength, and aerobic activities.  If you were told in the past that you have high risk for cardiovascular diseases, you may seek evaluation by your heart doctor prior to initiating moderate to intense exercise programs.   Handouts Provided Include  Lifestyle Medicine handouts  Learning Style & Readiness for Change Teaching method utilized: Visual & Auditory  Demonstrated degree of understanding via: Teach Back  Barriers to learning/adherence to lifestyle change: None  Goals Established by Pt Goals  Choose egg whites only. Avoid yolks. Increase plant based foods of fruits and vegetables. Increase water to 4 bottles per day Cut out bacon and sausage. Keep up exercising daily for 30 mins to an hour.   MONITORING & EVALUATION Dietary intake, weekly physical activity, and weight in 1 month.  Next Steps  Patient is to work on whole plant based food choices.Marland Kitchen

## 2022-06-30 ENCOUNTER — Encounter (HOSPITAL_BASED_OUTPATIENT_CLINIC_OR_DEPARTMENT_OTHER): Payer: Self-pay | Admitting: Physical Therapy

## 2022-06-30 ENCOUNTER — Ambulatory Visit (HOSPITAL_BASED_OUTPATIENT_CLINIC_OR_DEPARTMENT_OTHER): Payer: No Typology Code available for payment source | Attending: Family Medicine | Admitting: Physical Therapy

## 2022-06-30 DIAGNOSIS — M5459 Other low back pain: Secondary | ICD-10-CM | POA: Diagnosis not present

## 2022-06-30 DIAGNOSIS — M5416 Radiculopathy, lumbar region: Secondary | ICD-10-CM | POA: Diagnosis not present

## 2022-06-30 DIAGNOSIS — R2689 Other abnormalities of gait and mobility: Secondary | ICD-10-CM | POA: Insufficient documentation

## 2022-06-30 NOTE — Therapy (Signed)
OUTPATIENT PHYSICAL THERAPY THORACOLUMBAR TREATMENT    Patient Name: Megan Allen MRN: [id number] DOB:[birth year], 66 y.o., female Today's Date: 06/30/2022   PT End of Session - 06/30/22 1307     Visit Number 2    Number of Visits 6    Date for PT Re-Evaluation 08/01/22    PT Start Time 3295   arrives late   PT Stop Time 1340    PT Time Calculation (min) 33 min    Activity Tolerance Patient tolerated treatment well    Behavior During Therapy [facility] for tasks assessed/performed             Past Medical History:  Diagnosis Date   Atypical chest pain 05/06/2021   Chronic back pain    Headache    Moderate mitral regurgitation 05/06/2021   Murmur    Muscle spasm    Obesity (BMI 30.0-34.9) 05/06/2021   Pure hypercholesterolemia 05/06/2021   Past Surgical History:  Procedure Laterality Date   ABDOMINAL HYSTERECTOMY     partial   BREAST CYST ASPIRATION     COLONOSCOPY WITH PROPOFOL N/A 10/12/2020   Procedure: COLONOSCOPY WITH PROPOFOL;  Surgeon: Eloise Harman, DO;  [facility]: [facility];  Service: Endoscopy;  Laterality: N/A;  12:45   POLYPECTOMY  10/12/2020   Procedure: POLYPECTOMY;  Surgeon: Eloise Harman, DO;  Location: [facility];  Service: Endoscopy;;   Patient Active Problem List   Diagnosis Date Noted   Hyperlipidemia 05/10/2022   Moderate mitral regurgitation 05/06/2021   Obesity (BMI 30.0-34.9) 05/06/2021   Pure hypercholesterolemia 05/06/2021   Atypical chest pain 05/06/2021   Insect bite of left thigh 06/25/2020   Chronic migraine 05/29/2018   Chronic nonintractable headache 01/30/2018   Blurry vision 01/30/2018   Back pain 10/13/2016   Pain in joint, shoulder region 09/13/2016   Pain in joint, ankle and foot 09/13/2016    PCP: Dr Rosalita Chessman   REFERRING PROVIDER: Rosalita Chessman   REFERRING DIAG: J88.41,Y60.63 (ICD-10-CM) - Chronic bilateral low back pain with right-sided sciatica  Rationale for Evaluation and Treatment  Rehabilitation  THERAPY DIAG:  Other low back pain  Radiculopathy, lumbar region  Other abnormalities of gait and mobility  ONSET DATE: 2016 following a fall   SUBJECTIVE:                                                                                                                                                                                           SUBJECTIVE STATEMENT:  Pt states that the ball hurt but the pain feels better after. She states the ball will cause shooting pains.   Eval The  patient fell in 2016 on her right side. She has had right sided low back pain since that point. She has increased pain when she is standing and walking. She can lie down and it improves. She can lie on her back to decrease the pain. She can use heat or her bed massager to reduce her pain. She also had an ankle issue following the fall.   PERTINENT HISTORY:  Atypical chest pain; headache, Mitral valve regurgitation; muscle spasm, obesity ( has lost 16 pounds)   PAIN:  Are you having pain? Yes: NPRS scale: 5-6/10 at worst and right now  Pain location: start on the right side of her lower back and down the lateral leg.  Pain description: acing Aggravating factors: standing and walking Relieving factors: rest    PRECAUTIONS: None  WEIGHT BEARING RESTRICTIONS No  FALLS:  Has patient fallen in last 6 months? No  LIVING ENVIRONMENT: Has steps and stairs into her house  OCCUPATION: retired  Hobbies: exercising   PLOF: Independent  PATIENT GOALS :  To have less pain in her back    OBJECTIVE:   DIAGNOSTIC FINDINGS:  X-ray: No evidence of acute fracture malalignment of the lumbar spine.   Degenerative disc disease most advanced at L5-S1, with associated facet disease.  TODAY'S TREATMENT   STM bilat L/S paraspinals   Exercises - Supine Piriformis Stretch with Foot on Ground  - 1 x daily - 7 x weekly - 3 sets - 3 reps - 20 hold - Supine Posterior Pelvic Tilt  - 2 x daily -  7 x weekly - 2 sets - 10 reps - 2 hold - Seated Quadratus Lumborum Stretch in Chair  - 2 x daily - 7 x weekly - 1 sets - 3 reps - 30 hold - Standing Trunk Flexion at Wall  - 1 x daily - 7 x weekly - 1 sets - 10 reps   PATIENT EDUCATION:  Education details: anatomy, exercise progression,  muscle firing,  envelope of function, HEP, POC  Person educated: Patient Education method: Explanation, Demonstration, Tactile cues, Verbal cues, and Handouts Education comprehension: verbalized understanding, returned demonstration, verbal cues required, tactile cues required, and needs further education   HOME EXERCISE PROGRAM: Access Code: [email or web address] URL: [email or web address] Date: 06/21/2022 Prepared by: Carolyne Littles  ASSESSMENT:  CLINICAL IMPRESSION: Pt able to continue with lumbar mobility type exercise following STM. Pt did not have any increase in pain with new flexion based motions. Pt with improvement in soft tissue extensbility with R>L following deep ischemic pressure. Pt able to tolerate BW loaded flexion today without increased pain. HEP updated at this time as pt is familiar with previous exercises from last episode of therapy. Plan to review previous HEP and progress lumbar mobility as tolerated Pt would benefit from continued skilled therapy in order to reach goals and maximize functional lumbopelvic strength and ROM for prevention of further functional decline.    OBJECTIVE IMPAIRMENTS decreased activity tolerance, difficulty walking, decreased ROM, decreased strength, and pain.   ACTIVITY LIMITATIONS bending, standing, stairs, transfers, and locomotion level  PARTICIPATION LIMITATIONS: meal prep, cleaning, laundry, shopping, community activity, and yard work  PERSONAL FACTORS 3+ comorbidities: chronic headaches; mitral valve regurgitation   are also affecting patient's functional outcome.   REHAB POTENTIAL: Good  CLINICAL DECISION MAKING: Evolving/moderate  complexity  EVALUATION COMPLEXITY: Moderate   GOALS: Goals reviewed with patient? Yes  SHORT TERM GOALS: Target date: 07/21/2022  Patient will increase gross leg strength by 5 lbs bilateral  Baseline:  Goal status: INITIAL  2.  The patient will increase her hip IR and flexion to full and pain free on the right side.  Baseline:  Goal status: INITIAL  3.  The patient will be independent with base stretching and exercise program Baseline:  Goal status: INITIAL   LONG TERM GOALS: Target date: 08/11/2022  The patient will stand for 1 hour without increased pain in order to perform ADL's  Baseline:  Goal status: INITIAL  2.  Patient will ambulate community distances without radicular pain  Baseline:  Goal status: INITIAL  3.  Patient will have complete program for low back management withiut  Baseline:  Goal status: INITIAL   PLAN: PT FREQUENCY: 1x/week  PT DURATION: 6 weeks  PLANNED INTERVENTIONS: Therapeutic exercises, Therapeutic activity, Neuromuscular re-education, Balance training, Patient/Family education, Self Care, Joint mobilization, Dry Needling, Electrical stimulation, Spinal mobilization, Cryotherapy, Moist heat, Ultrasound, and Manual therapy.  PLAN FOR NEXT SESSION: assess tolerance to HEP; review basic table strengthening exercises; advance to gym exercises as tolerated. Consider manual therapy and dry needling PRN.    Daleen Bo, PT 06/30/2022, 1:45 PM

## 2022-07-06 ENCOUNTER — Ambulatory Visit (HOSPITAL_BASED_OUTPATIENT_CLINIC_OR_DEPARTMENT_OTHER): Payer: No Typology Code available for payment source | Admitting: Physical Therapy

## 2022-07-06 ENCOUNTER — Encounter (HOSPITAL_BASED_OUTPATIENT_CLINIC_OR_DEPARTMENT_OTHER): Payer: Self-pay | Admitting: Physical Therapy

## 2022-07-06 DIAGNOSIS — M5459 Other low back pain: Secondary | ICD-10-CM

## 2022-07-06 DIAGNOSIS — R2689 Other abnormalities of gait and mobility: Secondary | ICD-10-CM

## 2022-07-06 DIAGNOSIS — M5416 Radiculopathy, lumbar region: Secondary | ICD-10-CM

## 2022-07-06 NOTE — Therapy (Signed)
OUTPATIENT PHYSICAL THERAPY THORACOLUMBAR TREATMENT    Patient Name: Megan Allen MRN: [id number] DOB:1955-12-08, 66 y.o., female Today's Date: 07/06/2022   PT End of Session - 07/06/22 1159     Visit Number 3    Number of Visits 6    Date for PT Re-Evaluation 08/01/22    PT Start Time 1150    PT Stop Time 1230    PT Time Calculation (min) 40 min    Activity Tolerance Patient tolerated treatment well    Behavior During Therapy [facility] for tasks assessed/performed              Past Medical History:  Diagnosis Date   Atypical chest pain 05/06/2021   Chronic back pain    Headache    Moderate mitral regurgitation 05/06/2021   Murmur    Muscle spasm    Obesity (BMI 30.0-34.9) 05/06/2021   Pure hypercholesterolemia 05/06/2021   Past Surgical History:  Procedure Laterality Date   ABDOMINAL HYSTERECTOMY     partial   BREAST CYST ASPIRATION     COLONOSCOPY WITH PROPOFOL N/A 10/12/2020   Procedure: COLONOSCOPY WITH PROPOFOL;  Surgeon: Eloise Harman, DO;  [facility]: [facility];  Service: Endoscopy;  Laterality: N/A;  12:45   POLYPECTOMY  10/12/2020   Procedure: POLYPECTOMY;  Surgeon: Eloise Harman, DO;  [facility]: [facility];  Service: Endoscopy;;   Patient Active Problem List   Diagnosis Date Noted   Hyperlipidemia 05/10/2022   Moderate mitral regurgitation 05/06/2021   Obesity (BMI 30.0-34.9) 05/06/2021   Pure hypercholesterolemia 05/06/2021   Atypical chest pain 05/06/2021   Insect bite of left thigh 06/25/2020   Chronic migraine 05/29/2018   Chronic nonintractable headache 01/30/2018   Blurry vision 01/30/2018   Back pain 10/13/2016   Pain in joint, shoulder region 09/13/2016   Pain in joint, ankle and foot 09/13/2016    PCP: Dr Rosalita Chessman   REFERRING PROVIDER: Rosalita Chessman   REFERRING DIAG: D98.33,A25.05 (ICD-10-CM) - Chronic bilateral low back pain with right-sided sciatica  Rationale for Evaluation and Treatment  Rehabilitation  THERAPY DIAG:  Other low back pain  Radiculopathy, lumbar region  Other abnormalities of gait and mobility  ONSET DATE: 2016 following a fall   SUBJECTIVE:                                                                                                                                                                                           SUBJECTIVE STATEMENT:  Pt states that the ball hurt but the pain feels better after. She states she feels like the back pain is getting better. Pt requests more  manual therapy to her L hip since it has helped so much with the R and her back.  Eval The patient fell in 2016 on her right side. She has had right sided low back pain since that point. She has increased pain when she is standing and walking. She can lie down and it improves. She can lie on her back to decrease the pain. She can use heat or her bed massager to reduce her pain. She also had an ankle issue following the fall.   PERTINENT HISTORY:  Atypical chest pain; headache, Mitral valve regurgitation; muscle spasm, obesity ( has lost 16 pounds)   PAIN:  Are you having pain? Yes: NPRS scale: 4/10 at worst and right now  Pain location: start on the right side of her lower back and down the lateral leg.  Pain description: acing Aggravating factors: standing and walking Relieving factors: rest    PRECAUTIONS: None  WEIGHT BEARING RESTRICTIONS No  FALLS:  Has patient fallen in last 6 months? No  LIVING ENVIRONMENT: Has steps and stairs into her house  OCCUPATION: retired  Hobbies: exercising   PLOF: Independent  PATIENT GOALS :  To have less pain in her back    OBJECTIVE:   DIAGNOSTIC FINDINGS:  X-ray: No evidence of acute fracture malalignment of the lumbar spine.   Degenerative disc disease most advanced at L5-S1, with associated facet disease.  TODAY'S TREATMENT   STM bilat L/S paraspinals and bilat glutes  Exercises - supine SKTC stretch 10s  5x -bridge with GTB at knees 2x10  PATIENT EDUCATION:  Education details: anatomy, exercise progression,  muscle firing,  envelope of function, HEP, POC  Person educated: Patient Education method: Explanation, Demonstration, Tactile cues, Verbal cues, and Handouts Education comprehension: verbalized understanding, returned demonstration, verbal cues required, tactile cues required, and needs further education   HOME EXERCISE PROGRAM: Access Code: [email or web address] URL: [email or web address] Date: 06/21/2022 Prepared by: Carolyne Littles  ASSESSMENT:  CLINICAL IMPRESSION: Pt with report of relief of pain following STM bilaterally to paraspinals and hips. Pt able to progress some lumbopelvic strengthening but appears hesitant with bridging exercise. Pt required cuing for PPT position. HEP updated at this time to incorporate hip ext strength and lumbar mobility exercise. Plan to progress lumbar mobility as tolerated. TPDN handout edu provided. Pt to consider at next session. Pt would benefit from continued skilled therapy in order to reach goals and maximize functional lumbopelvic strength and ROM for prevention of further functional decline.    OBJECTIVE IMPAIRMENTS decreased activity tolerance, difficulty walking, decreased ROM, decreased strength, and pain.   ACTIVITY LIMITATIONS bending, standing, stairs, transfers, and locomotion level  PARTICIPATION LIMITATIONS: meal prep, cleaning, laundry, shopping, community activity, and yard work  PERSONAL FACTORS 3+ comorbidities: chronic headaches; mitral valve regurgitation   are also affecting patient's functional outcome.   REHAB POTENTIAL: Good  CLINICAL DECISION MAKING: Evolving/moderate complexity  EVALUATION COMPLEXITY: Moderate   GOALS: Goals reviewed with patient? Yes  SHORT TERM GOALS: Target date: 07/27/2022  Patient will increase gross leg strength by 5 lbs bilateral  Baseline: Goal status: INITIAL  2.  The patient  will increase her hip IR and flexion to full and pain free on the right side.  Baseline:  Goal status: INITIAL  3.  The patient will be independent with base stretching and exercise program Baseline:  Goal status: INITIAL   LONG TERM GOALS: Target date: 08/17/2022  The patient will stand for 1 hour without increased pain in order to perform ADL's  Baseline:  Goal status: INITIAL  2.  Patient will ambulate community distances without radicular pain  Baseline:  Goal status: INITIAL  3.  Patient will have complete program for low back management withiut  Baseline:  Goal status: INITIAL   PLAN: PT FREQUENCY: 1x/week  PT DURATION: 6 weeks  PLANNED INTERVENTIONS: Therapeutic exercises, Therapeutic activity, Neuromuscular re-education, Balance training, Patient/Family education, Self Care, Joint mobilization, Dry Needling, Electrical stimulation, Spinal mobilization, Cryotherapy, Moist heat, Ultrasound, and Manual therapy.  PLAN FOR NEXT SESSION: assess tolerance to HEP; review basic table strengthening exercises; advance to gym exercises as tolerated. Consider manual therapy and dry needling PRN.    Daleen Bo, PT 07/06/2022, 12:43 PM

## 2022-07-13 ENCOUNTER — Encounter (HOSPITAL_BASED_OUTPATIENT_CLINIC_OR_DEPARTMENT_OTHER): Payer: No Typology Code available for payment source | Admitting: Physical Therapy

## 2022-07-14 DIAGNOSIS — R69 Illness, unspecified: Secondary | ICD-10-CM | POA: Diagnosis not present

## 2022-07-18 ENCOUNTER — Ambulatory Visit: Payer: No Typology Code available for payment source

## 2022-07-20 ENCOUNTER — Ambulatory Visit (HOSPITAL_BASED_OUTPATIENT_CLINIC_OR_DEPARTMENT_OTHER): Payer: No Typology Code available for payment source | Admitting: Physical Therapy

## 2022-07-21 IMAGING — MG MM DIGITAL SCREENING BILAT W/ TOMO AND CAD
8 series · 8 of 24 positions shown · non-contrast
Comparison: Previous exam(s).

CLINICAL DATA: Screening.

EXAM:
DIGITAL SCREENING BILATERAL MAMMOGRAM WITH TOMOSYNTHESIS AND CAD
TECHNIQUE: Bilateral screening digital craniocaudal and mediolateral oblique
mammograms were obtained. Bilateral screening digital breast
tomosynthesis was performed. The images were evaluated with
computer-aided detection.

[L CC synth-2D]
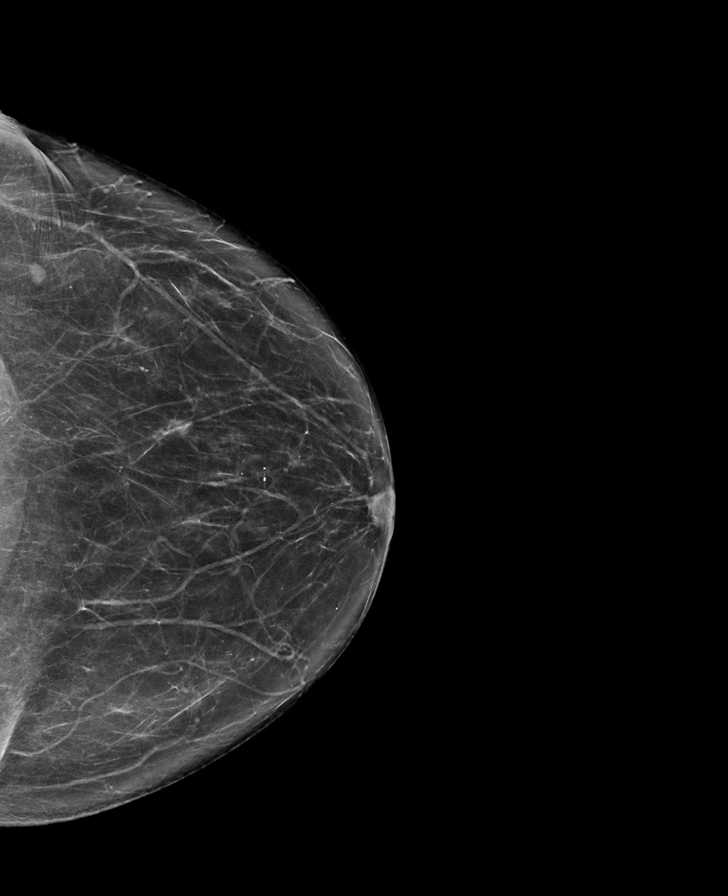

[R MLO synth-2D]
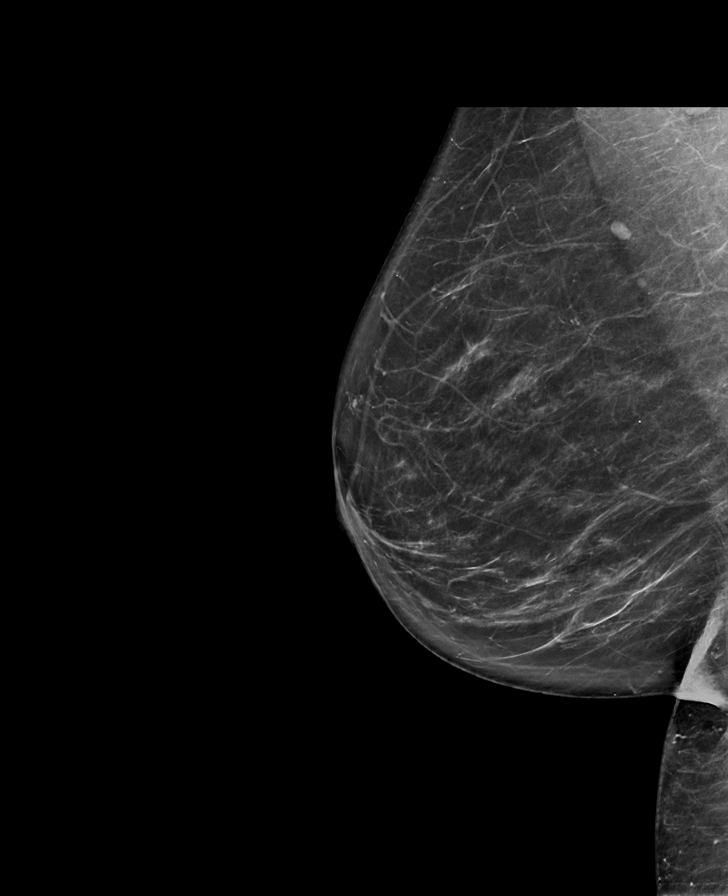

[L MLO synth-2D]
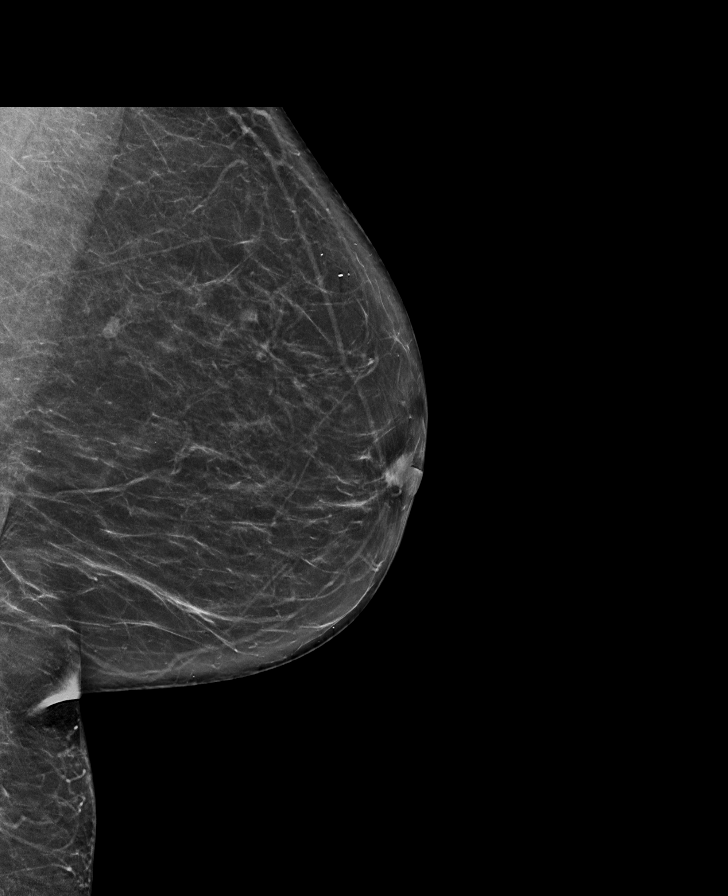

[R CC synth-2D]
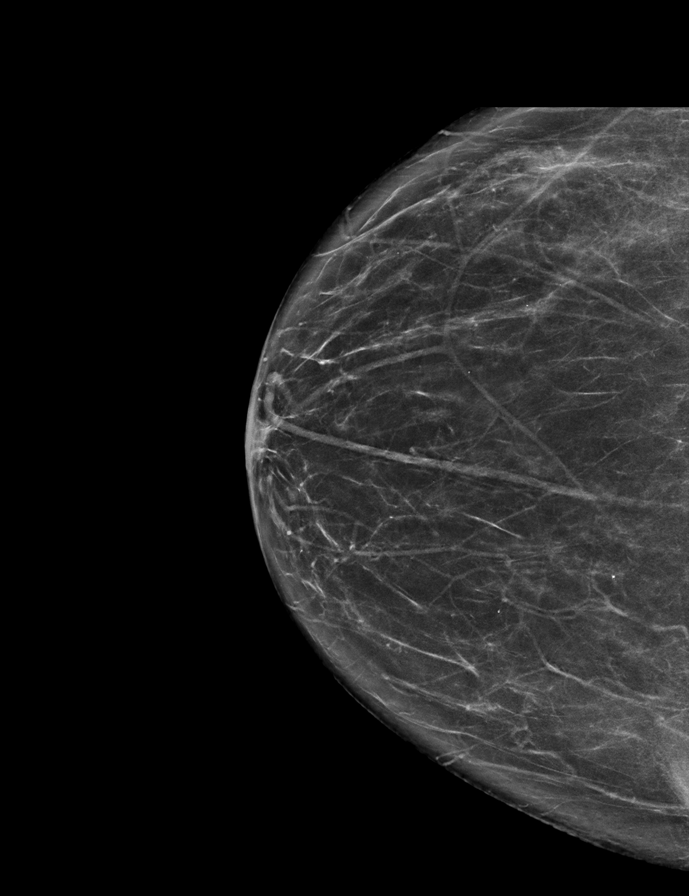

[R MLO tomo · tomo slice 40/79.0]
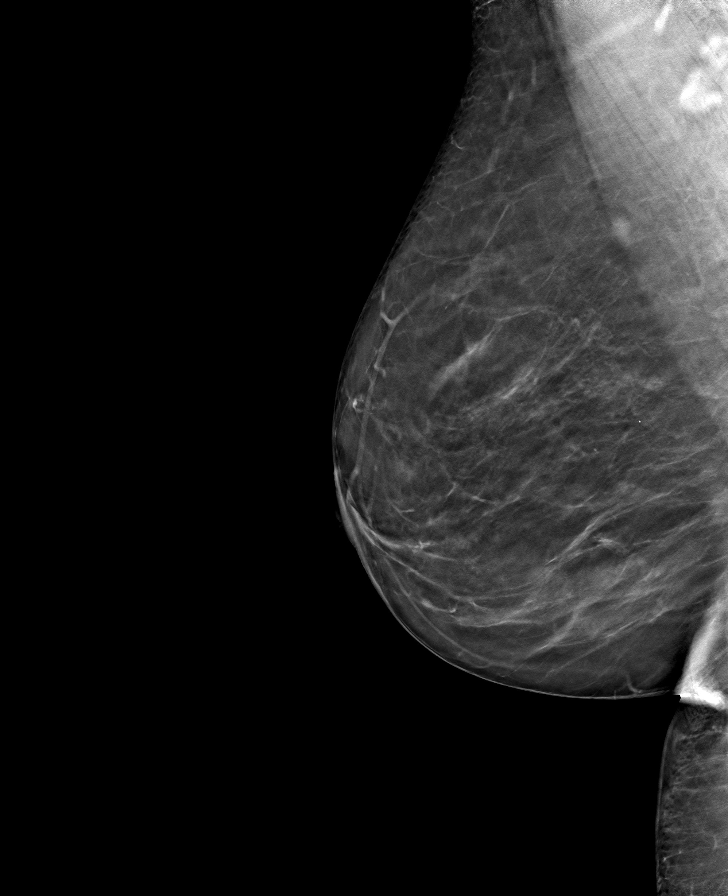

[L MLO tomo · tomo slice 39/77.0]
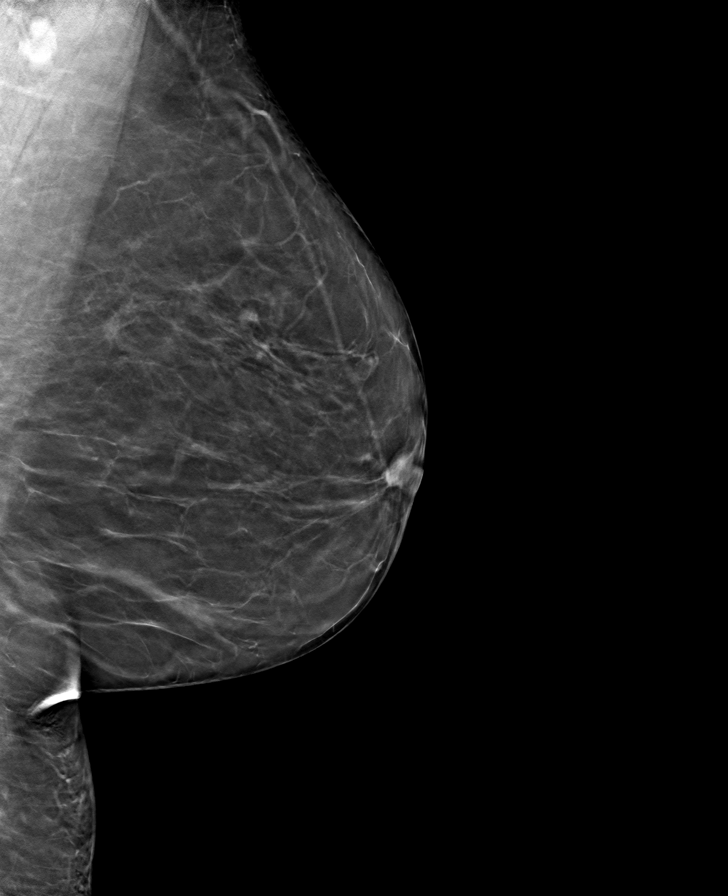

[R CC tomo · tomo slice 37/72.0]
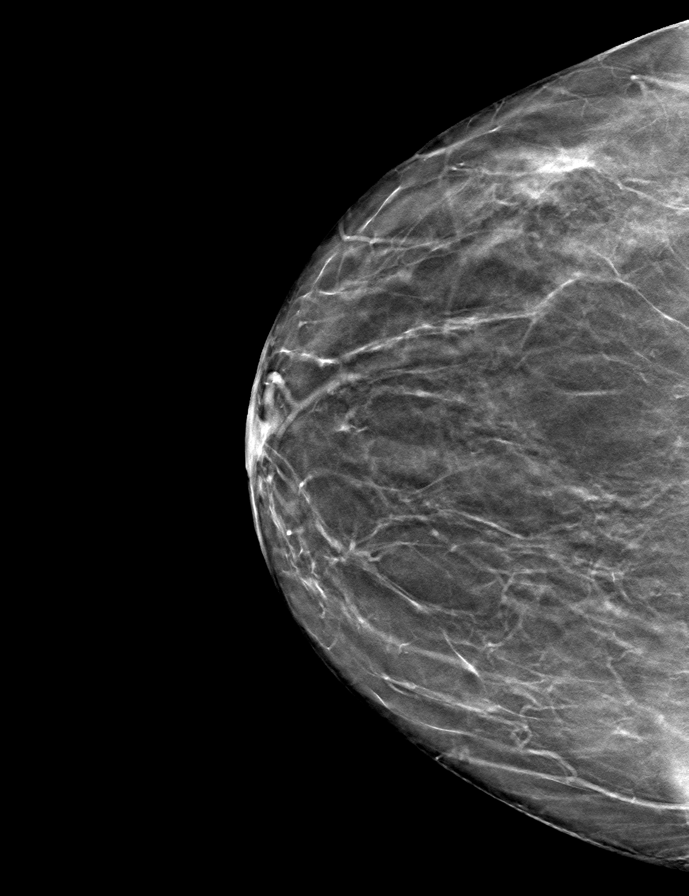

[L CC tomo · tomo slice 39/78.0]
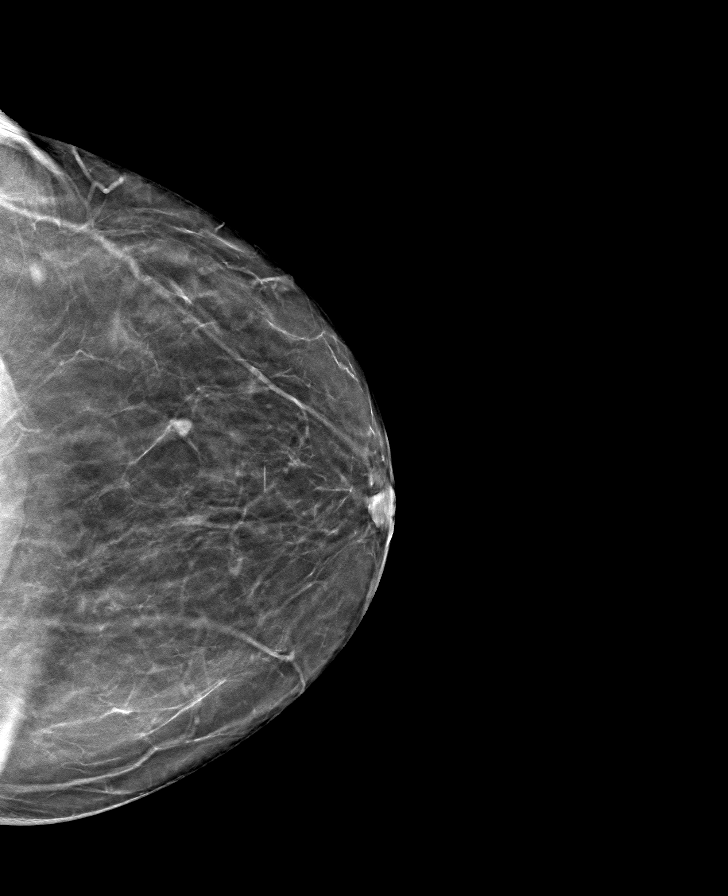

[8 of 24 positions shown; findings below may reference images not displayed]

ACR Breast Density Category b: There are scattered areas of
fibroglandular density.
FINDINGS: There are no findings suspicious for malignancy.
IMPRESSION: No mammographic evidence of malignancy. A result letter of this
screening mammogram will be mailed directly to the patient.

RECOMMENDATION:
Screening mammogram in one year. (Code:51-O-LD2)

BI-RADS CATEGORY  1: Negative.

## 2022-07-25 DIAGNOSIS — N182 Chronic kidney disease, stage 2 (mild): Secondary | ICD-10-CM | POA: Diagnosis not present

## 2022-07-25 DIAGNOSIS — E663 Overweight: Secondary | ICD-10-CM | POA: Diagnosis not present

## 2022-07-25 DIAGNOSIS — Z008 Encounter for other general examination: Secondary | ICD-10-CM | POA: Diagnosis not present

## 2022-07-25 DIAGNOSIS — F17211 Nicotine dependence, cigarettes, in remission: Secondary | ICD-10-CM | POA: Diagnosis not present

## 2022-07-25 DIAGNOSIS — E785 Hyperlipidemia, unspecified: Secondary | ICD-10-CM | POA: Diagnosis not present

## 2022-07-25 DIAGNOSIS — Z6825 Body mass index (BMI) 25.0-25.9, adult: Secondary | ICD-10-CM | POA: Diagnosis not present

## 2022-07-25 DIAGNOSIS — R7303 Prediabetes: Secondary | ICD-10-CM | POA: Diagnosis not present

## 2022-07-27 ENCOUNTER — Ambulatory Visit (HOSPITAL_BASED_OUTPATIENT_CLINIC_OR_DEPARTMENT_OTHER): Payer: No Typology Code available for payment source | Attending: Family Medicine | Admitting: Physical Therapy

## 2022-07-27 ENCOUNTER — Encounter (HOSPITAL_BASED_OUTPATIENT_CLINIC_OR_DEPARTMENT_OTHER): Payer: Self-pay | Admitting: Physical Therapy

## 2022-07-27 DIAGNOSIS — R69 Illness, unspecified: Secondary | ICD-10-CM | POA: Diagnosis not present

## 2022-07-27 DIAGNOSIS — R2689 Other abnormalities of gait and mobility: Secondary | ICD-10-CM | POA: Diagnosis not present

## 2022-07-27 DIAGNOSIS — M5459 Other low back pain: Secondary | ICD-10-CM | POA: Insufficient documentation

## 2022-07-27 DIAGNOSIS — M5416 Radiculopathy, lumbar region: Secondary | ICD-10-CM | POA: Insufficient documentation

## 2022-07-27 NOTE — Therapy (Signed)
OUTPATIENT PHYSICAL THERAPY THORACOLUMBAR TREATMENT/Progress Note     Patient Name: Megan Allen MRN: [id number] DOB:12-Jul-1956, 66 y.o., female Today's Date: 07/28/2022  Progress Note Reporting Period 06/20/2022 to 07/27/2022  See note below for Objective Data and Assessment of Progress/Goals.       PT End of Session - 07/27/22 1200     Visit Number 4    Number of Visits 6    Date for PT Re-Evaluation 08/01/22    PT Start Time 4944    PT Stop Time 1233    PT Time Calculation (min) 40 min    Activity Tolerance Patient tolerated treatment well    Behavior During Therapy [facility] for tasks assessed/performed               Past Medical History:  Diagnosis Date   Atypical chest pain 05/06/2021   Chronic back pain    Headache    Moderate mitral regurgitation 05/06/2021   Murmur    Muscle spasm    Obesity (BMI 30.0-34.9) 05/06/2021   Pure hypercholesterolemia 05/06/2021   Past Surgical History:  Procedure Laterality Date   ABDOMINAL HYSTERECTOMY     partial   BREAST CYST ASPIRATION     COLONOSCOPY WITH PROPOFOL N/A 10/12/2020   Procedure: COLONOSCOPY WITH PROPOFOL;  Surgeon: Eloise Harman, DO;  [facility];  Service: Endoscopy;  Laterality: N/A;  12:45   POLYPECTOMY  10/12/2020   Procedure: POLYPECTOMY;  Surgeon: Eloise Harman, DO;  Location[facility];  Service: Endoscopy;;   Patient Active Problem List   Diagnosis Date Noted   Hyperlipidemia 05/10/2022   Moderate mitral regurgitation 05/06/2021   Obesity (BMI 30.0-34.9) 05/06/2021   Pure hypercholesterolemia 05/06/2021   Atypical chest pain 05/06/2021   Insect bite of left thigh 06/25/2020   Chronic migraine 05/29/2018   Chronic nonintractable headache 01/30/2018   Blurry vision 01/30/2018   Back pain 10/13/2016   Pain in joint, shoulder region 09/13/2016   Pain in joint, ankle and foot 09/13/2016    PCP: Dr Rosalita Chessman   REFERRING PROVIDER: Rosalita Chessman   REFERRING  DIAG: H67.59,F63.84 (ICD-10-CM) - Chronic bilateral low back pain with right-sided sciatica  Rationale for Evaluation and Treatment Rehabilitation  THERAPY DIAG:  Other low back pain  Radiculopathy, lumbar region  Other abnormalities of gait and mobility  ONSET DATE: 2016 following a fall   SUBJECTIVE:                                                                                                                                                                                           SUBJECTIVE STATEMENT:  The patient reports that her back was doing well but she became ill and was not able to do much activity or movement. It has become exacerbated over the last two weeks. She is having pain in the left and right side of her back.  Eval The patient fell in 2016 on her right side. She has had right sided low back pain since that point. She has increased pain when she is standing and walking. She can lie down and it improves. She can lie on her back to decrease the pain. She can use heat or her bed massager to reduce her pain. She also had an ankle issue following the fall.   PERTINENT HISTORY:  Atypical chest pain; headache, Mitral valve regurgitation; muscle spasm, obesity ( has lost 16 pounds)   PAIN:  Are you having pain? Yes: NPRS scale: 4/10 at worst and right now  Pain location: start on the right side of her lower back and down the lateral leg.  Pain description: acing Aggravating factors: standing and walking Relieving factors: rest    PRECAUTIONS: None  WEIGHT BEARING RESTRICTIONS No  FALLS:  Has patient fallen in last 6 months? No  LIVING ENVIRONMENT: Has steps and stairs into her house  OCCUPATION: retired  Hobbies: exercising   PLOF: Independent  PATIENT GOALS :  To have less pain in her back    OBJECTIVE:   DIAGNOSTIC FINDINGS:  X-ray: No evidence of acute fracture malalignment of the lumbar spine.   Degenerative disc disease most advanced at  L5-S1, with associated facet disease.   PATIENT SURVEYS:  FOTO  decreased 2%     SCREENING FOR RED FLAGS: Bowel or bladder incontinence: No Spinal tumors: No Cauda equina syndrome: No Compression fracture: No checked after her fall but nothing  Abdominal aneurysm: No   COGNITION:           Overall cognitive status: Within functional limits for tasks assessed                          SENSATION: WFL   POSTURE: No Significant postural limitations   PALPATION: Significant trigger point in upper gluteal area and in lower lumbar spine area,   LUMBAR ROM:    Active  A/PROM  eval   Flexion Full ROM  Full ROM   Extension Mild pain    Right lateral flexion No pain   Left lateral flexion No pain    Right rotation Mild pain at end range Mild pain   Left rotation Mild pain at end range  Mild pain    (Blank rows = not tested)   LOWER EXTREMITY ROM:      Active  Right  eval Left  eval  Hip flexion Pain with flexion passed 105  full  Hip extension      Hip abduction      Hip adduction      Hip internal rotation Painful towards end range Full   Hip external rotation      Knee flexion      Knee extension      Ankle dorsiflexion      Ankle plantarflexion      Ankle inversion      Ankle eversion       (Blank rows = not tested)   LOWER EXTREMITY MMT:     MMT Right eval Left eval Right 10/4 Left 10/4  Hip flexion 10.7 18.2 17.2 19.4  Hip extension  Hip abduction 19.6 23.9 27.4 24.5  Hip adduction        Hip internal rotation        Hip external rotation        Knee flexion        Knee extension 11.4 20.8 19.8 23.7  Ankle dorsiflexion        Ankle plantarflexion        Ankle inversion        Ankle eversion         (Blank rows = not tested)         GAIT: Right hip drop in single leg stance; decreased hip flexion on the left     DIAGNOSTIC FINDINGS:  X-ray: No evidence of acute fracture malalignment of the lumbar spine.   Degenerative disc disease  most advanced at L5-S1, with associated facet disease.  TODAY'S TREATMENT  10/4 Performed re-assessment on the patient. Reviewed test and measures and goals including strength testing, FOTO, and ROM     Exercises  -bridge with GTB at knees 2x10 - Supine march 2x10  - Supine clam shell 3x15 red band   All with cuing for breathing   Gluteal stretch 3x20 sec hold  LTR x20  Hamstring stretch 3x20 sec     PATIENT EDUCATION:  Education details: anatomy, exercise progression,  muscle firing,  envelope of function, HEP, POC  Person educated: Patient Education method: Explanation, Demonstration, Tactile cues, Verbal cues, and Handouts Education comprehension: verbalized understanding, returned demonstration, verbal cues required, tactile cues required, and needs further education   HOME EXERCISE PROGRAM: Access Code: [email or web address] URL: [email or web address] Date: 06/21/2022 Prepared by: Carolyne Littles  ASSESSMENT:  CLINICAL IMPRESSION: The patient reports that she had had a significant improvement in pain prior to onset of sickness. At this time she is still making some progress but has back tracked a bit. Her strength has improved. She continues to have pain with end range passive hip flexion/ER/ad IR. She continues to have a trigger point in her low back and gluteal. Functionally she does feel like she was able to do more activity but she is back to being limited. Today we spent time going over her HEP. We reviewed stretches and exercises to o for home. We expanded her exercises a bit. She may require more cuing for technique with there-ex and exercises. She would beneift from skilled therapy 1-2/W6 to continue to work on strength and stretching exercises. See below for goal specific progress.   OBJECTIVE IMPAIRMENTS decreased activity tolerance, difficulty walking, decreased ROM, decreased strength, and pain.   ACTIVITY LIMITATIONS bending, standing, stairs, transfers,  and locomotion level  PARTICIPATION LIMITATIONS: meal prep, cleaning, laundry, shopping, community activity, and yard work  PERSONAL FACTORS 3+ comorbidities: chronic headaches; mitral valve regurgitation   are also affecting patient's functional outcome.   REHAB POTENTIAL: Good  CLINICAL DECISION MAKING: Evolving/moderate complexity  EVALUATION COMPLEXITY: Moderate   GOALS: Goals reviewed with patient? Yes  SHORT TERM GOALS: Target date: 08/18/2022  Patient will increase gross leg strength by 5 lbs bilateral  Baseline: Goal status: overall increased achieved   2.  The patient will increase her hip IR and flexion to full and pain free on the right side.  Baseline:  Goal status: continues to be painful today  3.  The patient will be independent with base stretching and exercise program Baseline:  Goal status: independent but has not been doing much    LONG TERM GOALS: Target date: 09/08/2022  The patient will  stand for 1 hour without increased pain in order to perform ADL's  Baseline:  Goal status: continues to be limited but was improving   2.  Patient will ambulate community distances without radicular pain  Baseline:  Goal status: radicular pain has improved but still not going community distances   3.  Patient will have complete program for low back management withiut  Baseline:  Goal status: working towards a complete program    PLAN: PT FREQUENCY: 1x/week  PT DURATION: 6 weeks  PLANNED INTERVENTIONS: Therapeutic exercises, Therapeutic activity, Neuromuscular re-education, Balance training, Patient/Family education, Self Care, Joint mobilization, Dry Needling, Electrical stimulation, Spinal mobilization, Cryotherapy, Moist heat, Ultrasound, and Manual therapy.  PLAN FOR NEXT SESSION: assess tolerance to HEP; review basic table strengthening exercises; advance to gym exercises as tolerated. Consider manual therapy and dry needling PRN. Continue to progress HEP.  Patient will trial pool therapy.    Carney Living, PT 07/28/2022, 9:20 AM

## 2022-07-28 ENCOUNTER — Encounter (HOSPITAL_BASED_OUTPATIENT_CLINIC_OR_DEPARTMENT_OTHER): Payer: Self-pay | Admitting: Physical Therapy

## 2022-08-01 ENCOUNTER — Ambulatory Visit (INDEPENDENT_AMBULATORY_CARE_PROVIDER_SITE_OTHER): Payer: No Typology Code available for payment source | Admitting: Podiatry

## 2022-08-01 ENCOUNTER — Ambulatory Visit: Payer: No Typology Code available for payment source | Admitting: Nutrition

## 2022-08-01 ENCOUNTER — Encounter: Payer: Self-pay | Admitting: Podiatry

## 2022-08-01 DIAGNOSIS — B351 Tinea unguium: Secondary | ICD-10-CM | POA: Diagnosis not present

## 2022-08-01 DIAGNOSIS — L600 Ingrowing nail: Secondary | ICD-10-CM | POA: Diagnosis not present

## 2022-08-02 ENCOUNTER — Ambulatory Visit (HOSPITAL_BASED_OUTPATIENT_CLINIC_OR_DEPARTMENT_OTHER): Payer: No Typology Code available for payment source | Admitting: Physical Therapy

## 2022-08-03 NOTE — Progress Notes (Signed)
Subjective:   Patient ID: Megan Allen, female   DOB: 66 y.o.   MRN: [id number]   HPI Patient presents stating that she is concerned about nail darkness on both big toes and that she has had trauma and is lost her right big toenail several times.  States that she does get some soreness also.  Patient does not smoke and is active    Review of Systems  All other systems reviewed and are negative.       Objective:  Physical Exam Vitals and nursing note reviewed.  Constitutional:      Appearance: She is well-developed.  Pulmonary:     Effort: Pulmonary effort is normal.  Musculoskeletal:        General: Normal range of motion.  Skin:    General: Skin is warm.  Neurological:     Mental Status: She is alert.     Neurovascular status was found to be intact muscle strength adequate range of motion adequate.  Patient was noted to have discoloration of the hallux nails bilateral with the distal two thirds of the nail right found to be debrided and she had lost the nail in the last few months.  Good digital perfusion well-oriented     Assessment:  Strong probability for trauma causing these problems given her history of injury to the nailbeds     Plan:  Reviewed with her recommended continuing to debride and its okay to pain but do not see current treatment did discuss nail removal and educated her on possibility for nail removal right which would have to be permanent

## 2022-08-09 ENCOUNTER — Ambulatory Visit
Admission: RE | Admit: 2022-08-09 | Discharge: 2022-08-09 | Disposition: A | Discharge status: Home / Self Care | Payer: No Typology Code available for payment source | Source: Ambulatory Visit | Attending: Family Medicine | Admitting: Family Medicine

## 2022-08-09 DIAGNOSIS — Z1231 Encounter for screening mammogram for malignant neoplasm of breast: Secondary | ICD-10-CM

## 2022-08-11 ENCOUNTER — Ambulatory Visit (HOSPITAL_BASED_OUTPATIENT_CLINIC_OR_DEPARTMENT_OTHER): Payer: No Typology Code available for payment source | Admitting: Physical Therapy

## 2022-08-11 ENCOUNTER — Encounter (HOSPITAL_BASED_OUTPATIENT_CLINIC_OR_DEPARTMENT_OTHER): Payer: Self-pay | Admitting: Physical Therapy

## 2022-08-11 DIAGNOSIS — M5416 Radiculopathy, lumbar region: Secondary | ICD-10-CM

## 2022-08-11 DIAGNOSIS — M5459 Other low back pain: Secondary | ICD-10-CM | POA: Diagnosis not present

## 2022-08-11 DIAGNOSIS — R2689 Other abnormalities of gait and mobility: Secondary | ICD-10-CM

## 2022-08-11 NOTE — Therapy (Signed)
OUTPATIENT PHYSICAL THERAPY THORACOLUMBAR TREATMENT/Progress Note     Patient Name: Megan Allen MRN: [id number] DOB:03/19/1956, 66 y.o., female Today's Date: 08/11/2022  Progress Note Reporting Period 06/20/2022 to 07/27/2022  See note below for Objective Data and Assessment of Progress/Goals.       PT End of Session - 08/11/22 1140     Visit Number 5    Number of Visits 16    Date for PT Re-Evaluation 09/08/22    PT Start Time 1100    PT Stop Time 1138    PT Time Calculation (min) 38 min    Activity Tolerance Patient tolerated treatment well    Behavior During Therapy [facility] for tasks assessed/performed                Past Medical History:  Diagnosis Date   Atypical chest pain 05/06/2021   Chronic back pain    Headache    Moderate mitral regurgitation 05/06/2021   Murmur    Muscle spasm    Obesity (BMI 30.0-34.9) 05/06/2021   Pure hypercholesterolemia 05/06/2021   Past Surgical History:  Procedure Laterality Date   ABDOMINAL HYSTERECTOMY     partial   BREAST CYST ASPIRATION     COLONOSCOPY WITH PROPOFOL N/A 10/12/2020   Procedure: COLONOSCOPY WITH PROPOFOL;  Surgeon: Eloise Harman, DO;  [facility];  Service: Endoscopy;  Laterality: N/A;  12:45   POLYPECTOMY  10/12/2020   Procedure: POLYPECTOMY;  Surgeon: Eloise Harman, DO;  [facility];  Service: Endoscopy;;   Patient Active Problem List   Diagnosis Date Noted   Hyperlipidemia 05/10/2022   Moderate mitral regurgitation 05/06/2021   Obesity (BMI 30.0-34.9) 05/06/2021   Pure hypercholesterolemia 05/06/2021   Atypical chest pain 05/06/2021   Insect bite of left thigh 06/25/2020   Chronic migraine 05/29/2018   Chronic nonintractable headache 01/30/2018   Blurry vision 01/30/2018   Back pain 10/13/2016   Pain in joint, shoulder region 09/13/2016   Pain in joint, ankle and foot 09/13/2016    PCP: Dr Rosalita Chessman   REFERRING PROVIDER: Rosalita Chessman   REFERRING  DIAG: D92.42,A83.41 (ICD-10-CM) - Chronic bilateral low back pain with right-sided sciatica  Rationale for Evaluation and Treatment Rehabilitation  THERAPY DIAG:  Other low back pain  Radiculopathy, lumbar region  Other abnormalities of gait and mobility  ONSET DATE: 2016 following a fall   SUBJECTIVE:                                                                                                                                                                                           SUBJECTIVE STATEMENT:  Pt states that the back pain was a little better after last session. She has some minor back aching today.   Eval The patient fell in 2016 on her right side. She has had right sided low back pain since that point. She has increased pain when she is standing and walking. She can lie down and it improves. She can lie on her back to decrease the pain. She can use heat or her bed massager to reduce her pain. She also had an ankle issue following the fall.   PERTINENT HISTORY:  Atypical chest pain; headache, Mitral valve regurgitation; muscle spasm, obesity ( has lost 16 pounds)   PAIN:  Are you having pain? Yes: NPRS scale: 4/10 at worst and right now  Pain location: start on the right side of her lower back and down the lateral leg.  Pain description: acing Aggravating factors: standing and walking Relieving factors: rest    PRECAUTIONS: None  WEIGHT BEARING RESTRICTIONS No  FALLS:  Has patient fallen in last 6 months? No  LIVING ENVIRONMENT: Has steps and stairs into her house  OCCUPATION: retired  Hobbies: exercising   PLOF: Independent  PATIENT GOALS :  To have less pain in her back    OBJECTIVE:   DIAGNOSTIC FINDINGS:  X-ray: No evidence of acute fracture malalignment of the lumbar spine.   Degenerative disc disease most advanced at L5-S1, with associated facet disease.   PATIENT SURVEYS:  FOTO  decreased 2%     SCREENING FOR RED FLAGS: Bowel or  bladder incontinence: No Spinal tumors: No Cauda equina syndrome: No Compression fracture: No checked after her fall but nothing  Abdominal aneurysm: No   COGNITION:           Overall cognitive status: Within functional limits for tasks assessed                          SENSATION: WFL   POSTURE: No Significant postural limitations   PALPATION: Significant trigger point in upper gluteal area and in lower lumbar spine area,   LUMBAR ROM:    Active  A/PROM  eval   Flexion Full ROM  Full ROM   Extension Mild pain    Right lateral flexion No pain   Left lateral flexion No pain    Right rotation Mild pain at end range Mild pain   Left rotation Mild pain at end range  Mild pain    (Blank rows = not tested)   LOWER EXTREMITY ROM:      Active  Right  eval Left  eval  Hip flexion Pain with flexion passed 105  full  Hip extension      Hip abduction      Hip adduction      Hip internal rotation Painful towards end range Full   Hip external rotation      Knee flexion      Knee extension      Ankle dorsiflexion      Ankle plantarflexion      Ankle inversion      Ankle eversion       (Blank rows = not tested)   LOWER EXTREMITY MMT:     MMT Right eval Left eval Right 10/4 Left 10/4  Hip flexion 10.7 18.2 17.2 19.4  Hip extension        Hip abduction 19.6 23.9 27.4 24.5  Hip adduction        Hip internal rotation  Hip external rotation        Knee flexion        Knee extension 11.4 20.8 19.8 23.7  Ankle dorsiflexion        Ankle plantarflexion        Ankle inversion        Ankle eversion         (Blank rows = not tested)         GAIT: Right hip drop in single leg stance; decreased hip flexion on the left     DIAGNOSTIC FINDINGS:  X-ray: No evidence of acute fracture malalignment of the lumbar spine.   Degenerative disc disease most advanced at L5-S1, with associated facet disease.  TODAY'S TREATMENT .  10/19   Pt seen for aquatic therapy  today.  Treatment took place in water 3.25-4 ft in depth at the [facility] pool. Temp of water was 91.  Pt entered/exited the pool via stairs (alternating) independently with bilat rail.   Warm up: 3x sidestepping, retro walking, fwd walking with board resistance   Exercises:  standing QL stretch at edge of pool 10s 5x each way Standing hip hinge at pool wall 10x Standing march 3x with noodle across pool Standing hip ABD 2x10 Mini squat at edge of pool with UE 2x20 Kickboard press and row 2x20 (TrA brace in quart squat position) SLS 30s 2x each side Standing shoulder ext with yellow buoys 2x10     Pt requires buoyancy for support and to offload joints with strengthening exercises. Viscosity of the water is needed for resistance of strengthening; water current perturbations provides challenge to standing balance unsupported, requiring increased core activation.     10/4 Performed re-assessment on the patient. Reviewed test and measures and goals including strength testing, FOTO, and ROM     Exercises  -bridge with GTB at knees 2x10 - Supine march 2x10  - Supine clam shell 3x15 red band   All with cuing for breathing   Gluteal stretch 3x20 sec hold  LTR x20  Hamstring stretch 3x20 sec     PATIENT EDUCATION:  Education details: anatomy, exercise progression,  muscle firing,  envelope of function, HEP, POC  Person educated: Patient Education method: Explanation, Demonstration, Tactile cues, Verbal cues, and Handouts Education comprehension: verbalized understanding, returned demonstration, verbal cues required, tactile cues required, and needs further education   HOME EXERCISE PROGRAM: Access Code: [email or web address] URL: [email or web address] Date: 06/21/2022 Prepared by: Carolyne Littles  ASSESSMENT:  CLINICAL IMPRESSION: Pt with good tolerance to introductory aquatic session today without increase in pain. Pt with improvement in lumbar mobility and hip  mobility from beginning to end of session. Pt with mild soreness and aching during session but without exacerbation during session. Pt did require significant VC and TC for hip hinge position in the water. Will need to revisit when back on land. Pt was able to progress functional mobility exercise today at 50% BW today with much greater success than on land. Consider continuation of alternating land and pool visits for progression of lumbopelvic and functional mobility. Pt would benefit from continued skilled therapy in order to reach goals and maximize functional lumbopelvic strength and ROM for prevention of further functional decline.    OBJECTIVE IMPAIRMENTS decreased activity tolerance, difficulty walking, decreased ROM, decreased strength, and pain.   ACTIVITY LIMITATIONS bending, standing, stairs, transfers, and locomotion level  PARTICIPATION LIMITATIONS: meal prep, cleaning, laundry, shopping, community activity, and yard work  PERSONAL FACTORS 3+ comorbidities: chronic headaches; mitral valve regurgitation  are also affecting patient's functional outcome.   REHAB POTENTIAL: Good  CLINICAL DECISION MAKING: Evolving/moderate complexity  EVALUATION COMPLEXITY: Moderate   GOALS: Goals reviewed with patient? Yes  SHORT TERM GOALS: Target date: 08/18/2022  Patient will increase gross leg strength by 5 lbs bilateral  Baseline: Goal status: overall increased achieved   2.  The patient will increase her hip IR and flexion to full and pain free on the right side.  Baseline:  Goal status: continues to be painful today  3.  The patient will be independent with base stretching and exercise program Baseline:  Goal status: independent but has not been doing much    LONG TERM GOALS: Target date: 09/08/2022  The patient will stand for 1 hour without increased pain in order to perform ADL's  Baseline:  Goal status: continues to be limited but was improving   2.  Patient will  ambulate community distances without radicular pain  Baseline:  Goal status: radicular pain has improved but still not going community distances   3.  Patient will have complete program for low back management withiut  Baseline:  Goal status: working towards a complete program    PLAN: PT FREQUENCY: 1x/week  PT DURATION: 6 weeks  PLANNED INTERVENTIONS: Therapeutic exercises, Therapeutic activity, Neuromuscular re-education, Balance training, Patient/Family education, Self Care, Joint mobilization, Dry Needling, Electrical stimulation, Spinal mobilization, Cryotherapy, Moist heat, Ultrasound, and Manual therapy.  PLAN FOR NEXT SESSION: assess tolerance to HEP; review basic table strengthening exercises; advance to gym exercises as tolerated. Consider manual therapy and dry needling PRN. Continue to progress HEP.   Daleen Bo, PT 08/11/2022, 11:48 AM

## 2022-08-12 ENCOUNTER — Other Ambulatory Visit: Payer: Self-pay | Admitting: Family Medicine

## 2022-08-12 DIAGNOSIS — R928 Other abnormal and inconclusive findings on diagnostic imaging of breast: Secondary | ICD-10-CM

## 2022-08-17 ENCOUNTER — Ambulatory Visit (HOSPITAL_BASED_OUTPATIENT_CLINIC_OR_DEPARTMENT_OTHER): Payer: No Typology Code available for payment source | Admitting: Physical Therapy

## 2022-08-17 ENCOUNTER — Encounter (HOSPITAL_BASED_OUTPATIENT_CLINIC_OR_DEPARTMENT_OTHER): Payer: Self-pay | Admitting: Physical Therapy

## 2022-08-17 DIAGNOSIS — R2689 Other abnormalities of gait and mobility: Secondary | ICD-10-CM

## 2022-08-17 DIAGNOSIS — M5416 Radiculopathy, lumbar region: Secondary | ICD-10-CM

## 2022-08-17 DIAGNOSIS — M5459 Other low back pain: Secondary | ICD-10-CM | POA: Diagnosis not present

## 2022-08-17 NOTE — Therapy (Signed)
OUTPATIENT PHYSICAL THERAPY THORACOLUMBAR TREATMENT   Patient Name: Megan Allen MRN: [id number] DOB:1956-03-14, 66 y.o., female Today's Date: 08/17/2022 Past Medical History:  Diagnosis Date   Atypical chest pain 05/06/2021   Chronic back pain    Headache    Moderate mitral regurgitation 05/06/2021   Murmur    Muscle spasm    Obesity (BMI 30.0-34.9) 05/06/2021   Pure hypercholesterolemia 05/06/2021   Past Surgical History:  Procedure Laterality Date   ABDOMINAL HYSTERECTOMY     partial   BREAST CYST ASPIRATION     COLONOSCOPY WITH PROPOFOL N/A 10/12/2020   Procedure: COLONOSCOPY WITH PROPOFOL;  Surgeon: Eloise Harman, DO;  [facility]: [facility];  Service: Endoscopy;  Laterality: N/A;  12:45   POLYPECTOMY  10/12/2020   Procedure: POLYPECTOMY;  Surgeon: Eloise Harman, DO;  Location: [facility];  Service: Endoscopy;;   Patient Active Problem List   Diagnosis Date Noted   Hyperlipidemia 05/10/2022   Moderate mitral regurgitation 05/06/2021   Obesity (BMI 30.0-34.9) 05/06/2021   Pure hypercholesterolemia 05/06/2021   Atypical chest pain 05/06/2021   Insect bite of left thigh 06/25/2020   Chronic migraine 05/29/2018   Chronic nonintractable headache 01/30/2018   Blurry vision 01/30/2018   Back pain 10/13/2016   Pain in joint, shoulder region 09/13/2016   Pain in joint, ankle and foot 09/13/2016    PCP: Dr Rosalita Chessman   REFERRING PROVIDER: Rosalita Chessman   REFERRING DIAG: X52.84,X32.44 (ICD-10-CM) - Chronic bilateral low back pain with right-sided sciatica  Rationale for Evaluation and Treatment Rehabilitation  THERAPY DIAG:  Other low back pain  Radiculopathy, lumbar region  Other abnormalities of gait and mobility  ONSET DATE: 2016 following a fall   SUBJECTIVE:                                                                                                                                                                                            SUBJECTIVE STATEMENT:  Pt states the aquatic therapy session helped a lot last time. She noticed a more significant improvement in pain after last visit. She has some soreness into the L side today vs the usual R.   Eval The patient fell in 2016 on her right side. She has had right sided low back pain since that point. She has increased pain when she is standing and walking. She can lie down and it improves. She can lie on her back to decrease the pain. She can use heat or her bed massager to reduce her pain. She also had an ankle issue following the fall.   PERTINENT HISTORY:  Atypical chest  pain; headache, Mitral valve regurgitation; muscle spasm, obesity ( has lost 16 pounds)   PAIN:  Are you having pain? Yes: NPRS scale: 3/10 at worst and right now  Pain location: L low back Pain description: acing Aggravating factors: standing and walking Relieving factors: rest    PRECAUTIONS: None  WEIGHT BEARING RESTRICTIONS No  FALLS:  Has patient fallen in last 6 months? No  LIVING ENVIRONMENT: Has steps and stairs into her house  OCCUPATION: retired  Hobbies: exercising   PLOF: Independent  PATIENT GOALS :  To have less pain in her back    OBJECTIVE:   DIAGNOSTIC FINDINGS:  X-ray: No evidence of acute fracture malalignment of the lumbar spine.   Degenerative disc disease most advanced at L5-S1, with associated facet disease.   PATIENT SURVEYS:  FOTO  decreased 2%     SCREENING FOR RED FLAGS: Bowel or bladder incontinence: No Spinal tumors: No Cauda equina syndrome: No Compression fracture: No checked after her fall but nothing  Abdominal aneurysm: No   COGNITION:           Overall cognitive status: Within functional limits for tasks assessed                          SENSATION: WFL   POSTURE: No Significant postural limitations   PALPATION: Significant trigger point in upper gluteal area and in lower lumbar spine area,   LUMBAR ROM:    Active   A/PROM  eval   Flexion Full ROM  Full ROM   Extension Mild pain    Right lateral flexion No pain   Left lateral flexion No pain    Right rotation Mild pain at end range Mild pain   Left rotation Mild pain at end range  Mild pain    (Blank rows = not tested)   LOWER EXTREMITY ROM:      Active  Right  eval Left  eval  Hip flexion Pain with flexion passed 105  full  Hip extension      Hip abduction      Hip adduction      Hip internal rotation Painful towards end range Full   Hip external rotation      Knee flexion      Knee extension      Ankle dorsiflexion      Ankle plantarflexion      Ankle inversion      Ankle eversion       (Blank rows = not tested)   LOWER EXTREMITY MMT:     MMT Right eval Left eval Right 10/4 Left 10/4  Hip flexion 10.7 18.2 17.2 19.4  Hip extension        Hip abduction 19.6 23.9 27.4 24.5  Hip adduction        Hip internal rotation        Hip external rotation        Knee flexion        Knee extension 11.4 20.8 19.8 23.7  Ankle dorsiflexion        Ankle plantarflexion        Ankle inversion        Ankle eversion         (Blank rows = not tested)         GAIT: Right hip drop in single leg stance; decreased hip flexion on the left     DIAGNOSTIC FINDINGS:  X-ray: No evidence of acute fracture  malalignment of the lumbar spine.   Degenerative disc disease most advanced at L5-S1, with associated facet disease.  TODAY'S TREATMENT .  10/25   Nu-step L4 6 min warm up  3 way QL stretch 5s 10x each Sidestepping YTB at knees 2x laps at bar STS YTB at knees 3x8 from high table Paloff press RTB 2x10 each way LE only deadbug 2x20 Supine SKTC stretch 30s 3x Fig 4 bridge 2x10    10/19   Pt seen for aquatic therapy today.  Treatment took place in water 3.25-4 ft in depth at the [facility] pool. Temp of water was 91.  Pt entered/exited the pool via stairs (alternating) independently with bilat rail.   Warm up: 3x  sidestepping, retro walking, fwd walking with board resistance   Exercises:  standing QL stretch at edge of pool 10s 5x each way Standing hip hinge at pool wall 10x Standing march 3x with noodle across pool Standing hip ABD 2x10 Mini squat at edge of pool with UE 2x20 Kickboard press and row 2x20 (TrA brace in quart squat position) SLS 30s 2x each side Standing shoulder ext with yellow buoys 2x10     Pt requires buoyancy for support and to offload joints with strengthening exercises. Viscosity of the water is needed for resistance of strengthening; water current perturbations provides challenge to standing balance unsupported, requiring increased core activation.     10/4 Performed re-assessment on the patient. Reviewed test and measures and goals including strength testing, FOTO, and ROM     Exercises  -bridge with GTB at knees 2x10 - Supine march 2x10  - Supine clam shell 3x15 red band   All with cuing for breathing   Gluteal stretch 3x20 sec hold  LTR x20  Hamstring stretch 3x20 sec     PATIENT EDUCATION:  Education details: anatomy, exercise progression,  muscle firing,  envelope of function, HEP, POC  Person educated: Patient Education method: Explanation, Demonstration, Tactile cues, Verbal cues, and Handouts Education comprehension: verbalized understanding, returned demonstration, verbal cues required, tactile cues required, and needs further education   HOME EXERCISE PROGRAM: Access Code: [email or web address] URL: [email or web address] Date: 06/21/2022 Prepared by: Carolyne Littles  ASSESSMENT:  CLINICAL IMPRESSION: Pt land based exercises progressed today and pt without pain. Pt does have poor lumbopelvic motor control today when working on sustained holds and contractions. Plan to continue with lumbopelvic strength and mobility as tolerated. With significant improvement in pain after last session after aquatic, pt will transition remaining land appts to  aquatic sessions in order to reduce resting level of pain. Buoyancy properties likely contributing to unloading of compressive forces at the back and hips. Pt would benefit from continued skilled therapy in order to reach goals and maximize functional lumbopelvic strength and ROM for prevention of further functional decline.    OBJECTIVE IMPAIRMENTS decreased activity tolerance, difficulty walking, decreased ROM, decreased strength, and pain.   ACTIVITY LIMITATIONS bending, standing, stairs, transfers, and locomotion level  PARTICIPATION LIMITATIONS: meal prep, cleaning, laundry, shopping, community activity, and yard work  PERSONAL FACTORS 3+ comorbidities: chronic headaches; mitral valve regurgitation   are also affecting patient's functional outcome.   REHAB POTENTIAL: Good  CLINICAL DECISION MAKING: Evolving/moderate complexity  EVALUATION COMPLEXITY: Moderate   GOALS: Goals reviewed with patient? Yes  SHORT TERM GOALS: Target date: 08/18/2022  Patient will increase gross leg strength by 5 lbs bilateral  Baseline: Goal status: overall increased achieved   2.  The patient will increase her hip IR and flexion to full  and pain free on the right side.  Baseline:  Goal status: continues to be painful today  3.  The patient will be independent with base stretching and exercise program Baseline:  Goal status: independent but has not been doing much    LONG TERM GOALS: Target date: 09/08/2022  The patient will stand for 1 hour without increased pain in order to perform ADL's  Baseline:  Goal status: continues to be limited but was improving   2.  Patient will ambulate community distances without radicular pain  Baseline:  Goal status: radicular pain has improved but still not going community distances   3.  Patient will have complete program for low back management withiut  Baseline:  Goal status: working towards a complete program    PLAN: PT FREQUENCY: 1x/week  PT  DURATION: 6 weeks  PLANNED INTERVENTIONS: Therapeutic exercises, Therapeutic activity, Neuromuscular re-education, Balance training, Patient/Family education, Self Care, Joint mobilization, Dry Needling, Electrical stimulation, Spinal mobilization, Cryotherapy, Moist heat, Ultrasound, and Manual therapy.  PLAN FOR NEXT SESSION: assess tolerance to HEP; review basic table strengthening exercises; advance to gym exercises as tolerated. Consider manual therapy and dry needling PRN. Continue to progress HEP.   Daleen Bo, PT 08/17/2022, 12:31 PM

## 2022-08-24 ENCOUNTER — Ambulatory Visit
Admission: RE | Admit: 2022-08-24 | Discharge: 2022-08-24 | Disposition: A | Discharge status: Home / Self Care | Payer: No Typology Code available for payment source | Source: Ambulatory Visit | Attending: Family Medicine | Admitting: Family Medicine

## 2022-08-24 DIAGNOSIS — R92323 Mammographic fibroglandular density, bilateral breasts: Secondary | ICD-10-CM | POA: Diagnosis not present

## 2022-08-24 DIAGNOSIS — R928 Other abnormal and inconclusive findings on diagnostic imaging of breast: Secondary | ICD-10-CM

## 2022-08-24 DIAGNOSIS — N6012 Diffuse cystic mastopathy of left breast: Secondary | ICD-10-CM | POA: Diagnosis not present

## 2022-08-24 DIAGNOSIS — N6011 Diffuse cystic mastopathy of right breast: Secondary | ICD-10-CM | POA: Diagnosis not present

## 2022-08-25 ENCOUNTER — Encounter (HOSPITAL_BASED_OUTPATIENT_CLINIC_OR_DEPARTMENT_OTHER): Payer: Self-pay | Admitting: Physical Therapy

## 2022-08-25 ENCOUNTER — Ambulatory Visit (HOSPITAL_BASED_OUTPATIENT_CLINIC_OR_DEPARTMENT_OTHER): Payer: No Typology Code available for payment source | Attending: Family Medicine | Admitting: Physical Therapy

## 2022-08-25 DIAGNOSIS — M5416 Radiculopathy, lumbar region: Secondary | ICD-10-CM | POA: Insufficient documentation

## 2022-08-25 DIAGNOSIS — M5459 Other low back pain: Secondary | ICD-10-CM | POA: Diagnosis not present

## 2022-08-25 DIAGNOSIS — R2689 Other abnormalities of gait and mobility: Secondary | ICD-10-CM | POA: Insufficient documentation

## 2022-08-25 NOTE — Therapy (Signed)
OUTPATIENT PHYSICAL THERAPY THORACOLUMBAR TREATMENT   Patient Name: Megan Allen MRN: [id number] DOB:06/06/1956, 66 y.o., female Today's Date: 08/25/2022 Past Medical History:  Diagnosis Date   Atypical chest pain 05/06/2021   Chronic back pain    Headache    Moderate mitral regurgitation 05/06/2021   Murmur    Muscle spasm    Obesity (BMI 30.0-34.9) 05/06/2021   Pure hypercholesterolemia 05/06/2021   Past Surgical History:  Procedure Laterality Date   ABDOMINAL HYSTERECTOMY     partial   BREAST CYST ASPIRATION     COLONOSCOPY WITH PROPOFOL N/A 10/12/2020   Procedure: COLONOSCOPY WITH PROPOFOL;  Surgeon: Eloise Harman, DO;  Location: [facility];  Service: Endoscopy;  Laterality: N/A;  12:45   POLYPECTOMY  10/12/2020   Procedure: POLYPECTOMY;  Surgeon: Eloise Harman, DO;  Location: [facility];  Service: Endoscopy;;   Patient Active Problem List   Diagnosis Date Noted   Hyperlipidemia 05/10/2022   Moderate mitral regurgitation 05/06/2021   Obesity (BMI 30.0-34.9) 05/06/2021   Pure hypercholesterolemia 05/06/2021   Atypical chest pain 05/06/2021   Insect bite of left thigh 06/25/2020   Chronic migraine 05/29/2018   Chronic nonintractable headache 01/30/2018   Blurry vision 01/30/2018   Back pain 10/13/2016   Pain in joint, shoulder region 09/13/2016   Pain in joint, ankle and foot 09/13/2016    PCP: Dr Rosalita Chessman   REFERRING PROVIDER: Rosalita Chessman   REFERRING DIAG: U20.25,K27.06 (ICD-10-CM) - Chronic bilateral low back pain with right-sided sciatica  Rationale for Evaluation and Treatment Rehabilitation  THERAPY DIAG:  Other low back pain  Radiculopathy, lumbar region  Other abnormalities of gait and mobility  ONSET DATE: 2016 following a fall   SUBJECTIVE:                                                                                                                                                                                            SUBJECTIVE STATEMENT:  Pt states she was sore after last session but no increase in pain. Pt just had her eyes dilated today and requests to have her eye protection on in the pool.   Eval The patient fell in 2016 on her right side. She has had right sided low back pain since that point. She has increased pain when she is standing and walking. She can lie down and it improves. She can lie on her back to decrease the pain. She can use heat or her bed massager to reduce her pain. She also had an ankle issue following the fall.   PERTINENT HISTORY:  Atypical chest pain; headache, Mitral valve  regurgitation; muscle spasm, obesity ( has lost 16 pounds)   PAIN:  Are you having pain? Yes: NPRS scale: 2/10 at worst and right now  Pain location: L low back Pain description: acing Aggravating factors: standing and walking Relieving factors: rest    PRECAUTIONS: None  WEIGHT BEARING RESTRICTIONS No  FALLS:  Has patient fallen in last 6 months? No  LIVING ENVIRONMENT: Has steps and stairs into her house  OCCUPATION: retired  Hobbies: exercising   PLOF: Independent  PATIENT GOALS :  To have less pain in her back    OBJECTIVE:   DIAGNOSTIC FINDINGS:  X-ray: No evidence of acute fracture malalignment of the lumbar spine.   Degenerative disc disease most advanced at L5-S1, with associated facet disease.   PATIENT SURVEYS:  FOTO  decreased 2%     SCREENING FOR RED FLAGS: Bowel or bladder incontinence: No Spinal tumors: No Cauda equina syndrome: No Compression fracture: No checked after her fall but nothing  Abdominal aneurysm: No   COGNITION:           Overall cognitive status: Within functional limits for tasks assessed                          SENSATION: WFL   POSTURE: No Significant postural limitations   PALPATION: Significant trigger point in upper gluteal area and in lower lumbar spine area,   LUMBAR ROM:    Active  A/PROM  eval   Flexion Full ROM   Full ROM   Extension Mild pain    Right lateral flexion No pain   Left lateral flexion No pain    Right rotation Mild pain at end range Mild pain   Left rotation Mild pain at end range  Mild pain    (Blank rows = not tested)   LOWER EXTREMITY ROM:      Active  Right  eval Left  eval  Hip flexion Pain with flexion passed 105  full  Hip extension      Hip abduction      Hip adduction      Hip internal rotation Painful towards end range Full   Hip external rotation      Knee flexion      Knee extension      Ankle dorsiflexion      Ankle plantarflexion      Ankle inversion      Ankle eversion       (Blank rows = not tested)   LOWER EXTREMITY MMT:     MMT Right eval Left eval Right 10/4 Left 10/4  Hip flexion 10.7 18.2 17.2 19.4  Hip extension        Hip abduction 19.6 23.9 27.4 24.5  Hip adduction        Hip internal rotation        Hip external rotation        Knee flexion        Knee extension 11.4 20.8 19.8 23.7  Ankle dorsiflexion        Ankle plantarflexion        Ankle inversion        Ankle eversion         (Blank rows = not tested)         GAIT: Right hip drop in single leg stance; decreased hip flexion on the left     DIAGNOSTIC FINDINGS:  X-ray: No evidence of acute fracture malalignment of the lumbar  spine.   Degenerative disc disease most advanced at L5-S1, with associated facet disease.  TODAY'S TREATMENT .  11/2  Pt seen for aquatic therapy today.  Treatment took place in water 3.25-4 ft in depth at the [facility] pool. Temp of water was 91.  Pt entered/exited the pool via stairs (alternating) independently with bilat rail.   Warm up: 3x sidestepping, retro walking, fwd walking with board resistance   Exercises:  standing QL stretch at edge of pool 10s 5x each way Standing hip flexion and ext (wide) 2x20 Standing march 3x with noodle across pool Standing hip ABD 20x continuous with fingertip support Standing hip hinge at  wall 3x8 Mini squat with UE nood reach 3x10 Kickboard press and row 2x20 (TrA brace in quart squat position) Kickboard rotation 2x10 3x laps lateral step with squat, UE adduction      Pt requires buoyancy for support and to offload joints with strengthening exercises. Viscosity of the water is needed for resistance of strengthening; water current perturbations provides challenge to standing balance unsupported, requiring increased core activation.     10/25   Nu-step L4 6 min warm up  3 way QL stretch 5s 10x each Sidestepping YTB at knees 2x laps at bar STS YTB at knees 3x8 from high table Paloff press RTB 2x10 each way LE only deadbug 2x20 Supine SKTC stretch 30s 3x Fig 4 bridge 2x10    10/19   Pt seen for aquatic therapy today.  Treatment took place in water 3.25-4 ft in depth at the [facility] pool. Temp of water was 91.  Pt entered/exited the pool via stairs (alternating) independently with bilat rail.   Warm up: 3x sidestepping, retro walking, fwd walking with board resistance   Exercises:  standing QL stretch at edge of pool 10s 5x each way Standing hip hinge at pool wall 10x Standing march 3x with noodle across pool Standing hip ABD 2x10 Mini squat at edge of pool with UE 2x20 Kickboard press and row 2x20 (TrA brace in quart squat position) SLS 30s 2x each side Standing shoulder ext with yellow buoys 2x10     Pt requires buoyancy for support and to offload joints with strengthening exercises. Viscosity of the water is needed for resistance of strengthening; water current perturbations provides challenge to standing balance unsupported, requiring increased core activation.     10/4 Performed re-assessment on the patient. Reviewed test and measures and goals including strength testing, FOTO, and ROM     Exercises  -bridge with GTB at knees 2x10 - Supine march 2x10  - Supine clam shell 3x15 red band   All with cuing for breathing   Gluteal  stretch 3x20 sec hold  LTR x20  Hamstring stretch 3x20 sec     PATIENT EDUCATION:  Education details: anatomy, exercise progression,  muscle firing,  envelope of function, HEP, POC  Person educated: Patient Education method: Explanation, Demonstration, Tactile cues, Verbal cues, and Handouts Education comprehension: verbalized understanding, returned demonstration, verbal cues required, tactile cues required, and needs further education   HOME EXERCISE PROGRAM: Access Code: [email or web address] URL: [email or web address] Date: 06/21/2022 Prepared by: Carolyne Littles  ASSESSMENT:  CLINICAL IMPRESSION: Pt with better response to exercise and less coordination deficits when in the pool setting. Pt also reports decrease in pain that is greater in the pool than on land. Pt able to coordinate hip hinging motion with less LOB and with more excursion than when on land. Pt able to demo better hip extension when squatting  at today's session as well. Pt able to progress repetitions and volume of loading today in the aquatic setting with no pain. Pt may benefit from more frequent aquatic visits. Will consult with evaluating therapist if change in POC to more aquatic is appropriate.   Pt would benefit from continued skilled therapy in order to reach goals and maximize functional lumbopelvic strength and ROM for prevention of further functional decline.    OBJECTIVE IMPAIRMENTS decreased activity tolerance, difficulty walking, decreased ROM, decreased strength, and pain.   ACTIVITY LIMITATIONS bending, standing, stairs, transfers, and locomotion level  PARTICIPATION LIMITATIONS: meal prep, cleaning, laundry, shopping, community activity, and yard work  PERSONAL FACTORS 3+ comorbidities: chronic headaches; mitral valve regurgitation   are also affecting patient's functional outcome.   REHAB POTENTIAL: Good  CLINICAL DECISION MAKING: Evolving/moderate complexity  EVALUATION COMPLEXITY:  Moderate   GOALS: Goals reviewed with patient? Yes  SHORT TERM GOALS: Target date: 08/18/2022  Patient will increase gross leg strength by 5 lbs bilateral  Baseline: Goal status: overall increased achieved   2.  The patient will increase her hip IR and flexion to full and pain free on the right side.  Baseline:  Goal status: continues to be painful today  3.  The patient will be independent with base stretching and exercise program Baseline:  Goal status: independent but has not been doing much    LONG TERM GOALS: Target date: 09/08/2022  The patient will stand for 1 hour without increased pain in order to perform ADL's  Baseline:  Goal status: continues to be limited but was improving   2.  Patient will ambulate community distances without radicular pain  Baseline:  Goal status: radicular pain has improved but still not going community distances   3.  Patient will have complete program for low back management withiut  Baseline:  Goal status: working towards a complete program    PLAN: PT FREQUENCY: 1x/week  PT DURATION: 6 weeks  PLANNED INTERVENTIONS: Therapeutic exercises, Therapeutic activity, Neuromuscular re-education, Balance training, Patient/Family education, Self Care, Joint mobilization, Dry Needling, Electrical stimulation, Spinal mobilization, Cryotherapy, Moist heat, Ultrasound, and Manual therapy.  PLAN FOR NEXT SESSION: assess tolerance to HEP; review basic table strengthening exercises; advance to gym exercises as tolerated. Consider manual therapy and dry needling PRN. Continue to progress HEP.   Daleen Bo, PT 08/25/2022, 11:40 AM

## 2022-08-31 ENCOUNTER — Ambulatory Visit: Payer: No Typology Code available for payment source | Attending: Physician Assistant | Admitting: Physician Assistant

## 2022-08-31 ENCOUNTER — Ambulatory Visit (HOSPITAL_BASED_OUTPATIENT_CLINIC_OR_DEPARTMENT_OTHER): Payer: No Typology Code available for payment source | Admitting: Physical Therapy

## 2022-08-31 ENCOUNTER — Encounter: Payer: Self-pay | Admitting: Physician Assistant

## 2022-08-31 VITALS — BP 116/77 | HR 77 | Temp 97.7°F | Ht 66.0 in | Wt 159.0 lb

## 2022-08-31 DIAGNOSIS — R52 Pain, unspecified: Secondary | ICD-10-CM | POA: Diagnosis not present

## 2022-08-31 DIAGNOSIS — R7303 Prediabetes: Secondary | ICD-10-CM

## 2022-08-31 DIAGNOSIS — E7849 Other hyperlipidemia: Secondary | ICD-10-CM

## 2022-08-31 DIAGNOSIS — M5416 Radiculopathy, lumbar region: Secondary | ICD-10-CM

## 2022-08-31 DIAGNOSIS — G44229 Chronic tension-type headache, not intractable: Secondary | ICD-10-CM

## 2022-08-31 DIAGNOSIS — M5459 Other low back pain: Secondary | ICD-10-CM

## 2022-08-31 DIAGNOSIS — R2689 Other abnormalities of gait and mobility: Secondary | ICD-10-CM

## 2022-08-31 MED ORDER — MELOXICAM 7.5 MG PO TABS: 7.5000 mg | ORAL_TABLET | Freq: Every day | ORAL | 0 refills | Status: AC

## 2022-08-31 MED ORDER — ATORVASTATIN CALCIUM 20 MG PO TABS: 20.0000 mg | ORAL_TABLET | Freq: Every day | ORAL | 6 refills | Status: DC

## 2022-08-31 MED ORDER — FLUTICASONE PROPIONATE 50 MCG/ACT NA SUSP: 2.0000 | Freq: Every day | NASAL | 6 refills | Status: AC

## 2022-08-31 NOTE — Therapy (Signed)
OUTPATIENT PHYSICAL THERAPY THORACOLUMBAR TREATMENT   Patient Name: Megan Allen MRN: [id number] DOB:1956/05/21, 66 y.o., female Today's Date: 08/25/2022 Past Medical History:  Diagnosis Date   Atypical chest pain 05/06/2021   Chronic back pain    Headache    Moderate mitral regurgitation 05/06/2021   Murmur    Muscle spasm    Obesity (BMI 30.0-34.9) 05/06/2021   Pure hypercholesterolemia 05/06/2021   Past Surgical History:  Procedure Laterality Date   ABDOMINAL HYSTERECTOMY     partial   BREAST CYST ASPIRATION     COLONOSCOPY WITH PROPOFOL N/A 10/12/2020   Procedure: COLONOSCOPY WITH PROPOFOL;  Surgeon: Eloise Harman, DO;  Location: [facility];  Service: Endoscopy;  Laterality: N/A;  12:45   POLYPECTOMY  10/12/2020   Procedure: POLYPECTOMY;  Surgeon: Eloise Harman, DO;  Location: [facility];  Service: Endoscopy;;   Patient Active Problem List   Diagnosis Date Noted   Hyperlipidemia 05/10/2022   Moderate mitral regurgitation 05/06/2021   Obesity (BMI 30.0-34.9) 05/06/2021   Pure hypercholesterolemia 05/06/2021   Atypical chest pain 05/06/2021   Insect bite of left thigh 06/25/2020   Chronic migraine 05/29/2018   Chronic nonintractable headache 01/30/2018   Blurry vision 01/30/2018   Back pain 10/13/2016   Pain in joint, shoulder region 09/13/2016   Pain in joint, ankle and foot 09/13/2016    PCP: Dr Rosalita Chessman   REFERRING PROVIDER: Rosalita Chessman   REFERRING DIAG: C78.93,Y10.17 (ICD-10-CM) - Chronic bilateral low back pain with right-sided sciatica  Rationale for Evaluation and Treatment Rehabilitation  THERAPY DIAG:  Other low back pain  Radiculopathy, lumbar region  Other abnormalities of gait and mobility  ONSET DATE: 2016 following a fall   SUBJECTIVE:                                                                                                                                                                                            SUBJECTIVE STATEMENT:  The patient reports both legs and many of her joints are achy today. She feels like it may be her statin.  Eval The patient fell in 2016 on her right side. She has had right sided low back pain since that point. She has increased pain when she is standing and walking. She can lie down and it improves. She can lie on her back to decrease the pain. She can use heat or her bed massager to reduce her pain. She also had an ankle issue following the fall.   PERTINENT HISTORY:  Atypical chest pain; headache, Mitral valve regurgitation; muscle spasm, obesity ( has lost 16 pounds)  PAIN:  Are you having pain? Yes: NPRS scale: 4/10 gross bilateral LE  Pain location: L low back Pain description: acing Aggravating factors: standing and walking Relieving factors: rest    PRECAUTIONS: None  WEIGHT BEARING RESTRICTIONS No  FALLS:  Has patient fallen in last 6 months? No  LIVING ENVIRONMENT: Has steps and stairs into her house  OCCUPATION: retired  Hobbies: exercising   PLOF: Independent  PATIENT GOALS :  To have less pain in her back    OBJECTIVE:   DIAGNOSTIC FINDINGS:  X-ray: No evidence of acute fracture malalignment of the lumbar spine.   Degenerative disc disease most advanced at L5-S1, with associated facet disease.   PATIENT SURVEYS:  FOTO  decreased 2%     SCREENING FOR RED FLAGS: Bowel or bladder incontinence: No Spinal tumors: No Cauda equina syndrome: No Compression fracture: No checked after her fall but nothing  Abdominal aneurysm: No   COGNITION:           Overall cognitive status: Within functional limits for tasks assessed                          SENSATION: WFL   POSTURE: No Significant postural limitations   PALPATION: Significant trigger point in upper gluteal area and in lower lumbar spine area,   LUMBAR ROM:    Active  A/PROM  eval   Flexion Full ROM  Full ROM   Extension Mild pain    Right lateral flexion  No pain   Left lateral flexion No pain    Right rotation Mild pain at end range Mild pain   Left rotation Mild pain at end range  Mild pain    (Blank rows = not tested)   LOWER EXTREMITY ROM:      Active  Right  eval Left  eval  Hip flexion Pain with flexion passed 105  full  Hip extension      Hip abduction      Hip adduction      Hip internal rotation Painful towards end range Full   Hip external rotation      Knee flexion      Knee extension      Ankle dorsiflexion      Ankle plantarflexion      Ankle inversion      Ankle eversion       (Blank rows = not tested)   LOWER EXTREMITY MMT:     MMT Right eval Left eval Right 10/4 Left 10/4  Hip flexion 10.7 18.2 17.2 19.4  Hip extension        Hip abduction 19.6 23.9 27.4 24.5  Hip adduction        Hip internal rotation        Hip external rotation        Knee flexion        Knee extension 11.4 20.8 19.8 23.7  Ankle dorsiflexion        Ankle plantarflexion        Ankle inversion        Ankle eversion         (Blank rows = not tested)         GAIT: Right hip drop in single leg stance; decreased hip flexion on the left     DIAGNOSTIC FINDINGS:  X-ray: No evidence of acute fracture malalignment of the lumbar spine.   Degenerative disc disease most advanced at L5-S1, with associated facet  disease.  TODAY'S TREATMENT . 11/8 Pt seen for aquatic therapy today.  Treatment took place in water 3.25-4 ft in depth at the [facility] pool. Temp of water was 91.  Pt entered/exited the pool via stairs (alternating) independently with bilat rail.   Warm up: 3x sidestepping, retro walking, fwd walking with board resistance   Exercises:  standing QL stretch at edge of pool 10s 5x each way Standing hip hinge at pool wall 2x10  Standing march 3x with noodle across pool Standing hip ABD 2x10 Mini squat at edge of pool with UE 2x20 Kickboard press and row 2x20 (TrA brace in quart squat position) SLS 30s 2x  each side Standing shoulder ext with yellow buoys 2x10     Pt requires buoyancy for support and to offload joints with strengthening exercises. Viscosity of the water is needed for resistance of strengthening; water current perturbations provides challenge to standing balance unsupported, requiring increased core activation.     11/2  Pt seen for aquatic therapy today.  Treatment took place in water 3.25-4 ft in depth at the [facility] pool. Temp of water was 91.  Pt entered/exited the pool via stairs (alternating) independently with bilat rail.   Warm up: 3x sidestepping, retro walking, fwd walking with board resistance   Exercises:  standing QL stretch at edge of pool 10s 5x each way Standing hip flexion and ext (wide) 2x20 Standing march 3x with noodle across pool Standing hip ABD 20x continuous with fingertip support Standing hip hinge at wall 3x8 Mini squat with UE nood reach 3x10 Kickboard press and row 2x20 (TrA brace in quart squat position) Kickboard rotation 2x10 3x laps lateral step with squat, UE adduction      Pt requires buoyancy for support and to offload joints with strengthening exercises. Viscosity of the water is needed for resistance of strengthening; water current perturbations provides challenge to standing balance unsupported, requiring increased core activation.     10/25   Nu-step L4 6 min warm up  3 way QL stretch 5s 10x each Sidestepping YTB at knees 2x laps at bar STS YTB at knees 3x8 from high table Paloff press RTB 2x10 each way LE only deadbug 2x20 Supine SKTC stretch 30s 3x Fig 4 bridge 2x10    10/19   Pt seen for aquatic therapy today.  Treatment took place in water 3.25-4 ft in depth at the [facility] pool. Temp of water was 91.  Pt entered/exited the pool via stairs (alternating) independently with bilat rail.   Warm up: 3x sidestepping, retro walking, fwd walking with board resistance   Exercises:  standing  QL stretch at edge of pool 10s 5x each way Standing hip hinge at pool wall 10x Standing march 3x with noodle across pool Standing hip ABD 2x10 Mini squat at edge of pool with UE 2x20 Kickboard press and row 2x20 (TrA brace in quart squat position) SLS 30s 2x each side Standing shoulder ext with yellow buoys 2x10     Pt requires buoyancy for support and to offload joints with strengthening exercises. Viscosity of the water is needed for resistance of strengthening; water current perturbations provides challenge to standing balance unsupported, requiring increased core activation.       PATIENT EDUCATION:  Education details: anatomy, exercise progression,  muscle firing,  envelope of function, HEP, POC  Person educated: Patient Education method: Explanation, Demonstration, Tactile cues, Verbal cues, and Handouts Education comprehension: verbalized understanding, returned demonstration, verbal cues required, tactile cues required, and  needs further education   HOME EXERCISE PROGRAM: Access Code: [email or web address] URL: [email or web address] Date: 06/21/2022 Prepared by: Carolyne Littles  ASSESSMENT:  CLINICAL IMPRESSION: Pt continues to tolerate pool exercises better then land. She was abel to go through several standing series of exercises. She came in feeling gross pain in both logs. She feels like it may be coming from her medication but she isn't sure. We will continue to progress exercises as tolerated.   OBJECTIVE IMPAIRMENTS decreased activity tolerance, difficulty walking, decreased ROM, decreased strength, and pain.   ACTIVITY LIMITATIONS bending, standing, stairs, transfers, and locomotion level  PARTICIPATION LIMITATIONS: meal prep, cleaning, laundry, shopping, community activity, and yard work  PERSONAL FACTORS 3+ comorbidities: chronic headaches; mitral valve regurgitation   are also affecting patient's functional outcome.   REHAB POTENTIAL: Good  CLINICAL  DECISION MAKING: Evolving/moderate complexity  EVALUATION COMPLEXITY: Moderate   GOALS: Goals reviewed with patient? Yes  SHORT TERM GOALS: Target date: 08/18/2022  Patient will increase gross leg strength by 5 lbs bilateral  Baseline: Goal status: overall increased achieved   2.  The patient will increase her hip IR and flexion to full and pain free on the right side.  Baseline:  Goal status: continues to be painful today  3.  The patient will be independent with base stretching and exercise program Baseline:  Goal status: independent but has not been doing much    LONG TERM GOALS: Target date: 09/08/2022  The patient will stand for 1 hour without increased pain in order to perform ADL's  Baseline:  Goal status: continues to be limited but was improving   2.  Patient will ambulate community distances without radicular pain  Baseline:  Goal status: radicular pain has improved but still not going community distances   3.  Patient will have complete program for low back management withiut  Baseline:  Goal status: working towards a complete program    PLAN: PT FREQUENCY: 1x/week  PT DURATION: 6 weeks  PLANNED INTERVENTIONS: Therapeutic exercises, Therapeutic activity, Neuromuscular re-education, Balance training, Patient/Family education, Self Care, Joint mobilization, Dry Needling, Electrical stimulation, Spinal mobilization, Cryotherapy, Moist heat, Ultrasound, and Manual therapy.  PLAN FOR NEXT SESSION: assess tolerance to HEP; review basic table strengthening exercises; advance to gym exercises as tolerated. Consider manual therapy and dry needling PRN. Continue to progress HEP.   Carolyne Littles PT DPT  08/25/2022, 11:40 AM

## 2022-08-31 NOTE — Patient Instructions (Signed)
Keep up the great work with exercise and healthy nutrition!!!

## 2022-08-31 NOTE — Progress Notes (Signed)
Patient ID: Megan Allen, female   DOB: 11-Oct-1956, 66 y.o.   MRN: [id number]   Megan Allen, is a 66 y.o. female  ERD:[id number]  HUD:[id number]  DOB - [birth year]  Chief Complaint  Patient presents with   Follow-up    F/u. Pain in legs & hands X1 week. Pt would like labs on cholesterol, A1C and more. No to flu vax.       Subjective:   Megan Allen is a 66 y.o. female here today for med RF.  Last week she was having body aches, esp in her legs and some in her hands.  Voltaren gel helped with the hands.  She has been much more active-using the elliptical, walking outdoors-working hard for lifestyle changes and to get healthier No problems updated.  ALLERGIES: Allergies  Allergen Reactions   Bee Venom    Caffeine     Chest pain, heart racing    PAST MEDICAL HISTORY: Past Medical History:  Diagnosis Date   Atypical chest pain 05/06/2021   Chronic back pain    Headache    Moderate mitral regurgitation 05/06/2021   Murmur    Muscle spasm    Obesity (BMI 30.0-34.9) 05/06/2021   Pure hypercholesterolemia 05/06/2021    MEDICATIONS AT HOME: Prior to Admission medications   Medication Sig Start Date End Date Taking? Authorizing Provider  acetaminophen (TYLENOL) 650 MG CR tablet Take 650 mg by mouth as needed for pain.   Yes [provider]  Ascorbic Acid (VITAMIN C) 1000 MG tablet Take 1,000 mg by mouth daily.   Yes [provider]  bisacodyl (DULCOLAX) 5 MG EC tablet Take 5 mg by mouth daily as needed for moderate constipation.   Yes [provider]  Blood Glucose Monitoring Suppl (ONETOUCH VERIO) w/Device KIT Test blood sugar 3 times daily 05/19/22  Yes Newlin, Enobong, MD  Calcium Carb-Cholecalciferol 600-800 MG-UNIT TABS Take 1 tablet by mouth daily.   Yes [provider]  Cholecalciferol (VITAMIN D3 PO) Take 2,000 Units by mouth daily.   Yes [provider]  Cyanocobalamin (VITAMIN B-12 PO) Take 3,000 mcg by mouth daily.    Yes [provider]  Diclofenac Sodium 2 % SOLN Place onto the skin daily.   Yes [provider]  ECHINACEA PO Take 400 mg by mouth as needed.   Yes [provider]  EPINEPHrine 0.3 mg/0.3 mL IJ SOAJ injection Inject 0.3 mLs (0.3 mg total) into the muscle as needed for anaphylaxis (severe allergic reactions from bee or other insect stings resulting in shortness of breath). 06/25/20  Yes Elsie Stain, MD  famotidine (PEPCID) 40 MG tablet Take 1 tablet (40 mg total) by mouth daily. For five days. 06/25/20  Yes Newlin, Charlane Ferretti, MD  fluticasone (FLONASE) 50 MCG/ACT nasal spray Place 2 sprays into both nostrils daily. 08/31/22  Yes Freeman Caldron M, PA-C  glucose blood (ONETOUCH VERIO) test strip Use as instructed 05/19/22  Yes [name], MD  Lancets (ONETOUCH DELICA PLUS [id number]) MISC Use to test blood sugar 3 times a day 05/19/22  Yes Newlin, Charlane Ferretti, MD  methocarbamol (ROBAXIN) 750 MG tablet Take 1 tablet (750 mg total) by mouth every 8 (eight) hours as needed for muscle spasms. 03/10/21  Yes [name], MD  Multiple Vitamin (MULTIVITAMIN) capsule Take 1 capsule by mouth daily.   Yes [provider]  UNABLE TO FIND Take 1 tablet by mouth as needed. OTC Calms for simple nervous tension and occasional sleeplessness.   Yes [provider]  atorvastatin (LIPITOR) 20 MG tablet Take 1 tablet (20 mg total) by mouth daily. 08/31/22   Ronae Noell, Dionne Bucy, PA-C    ROS: Neg HEENT Neg resp Neg cardiac Neg GI Neg GU Neg psych Neg neuro  Objective:   Vitals:   08/31/22 0914  BP: 116/77  Pulse: 77  Temp: 97.7 F (36.5 C)  TempSrc: Oral  SpO2: 100%  Weight: 159 lb (72.1 kg)  Height: _0  (1.676 m)   Exam General appearance : Awake, alert, not in any distress. Speech Clear. Not toxic looking HEENT: Atraumatic and Normocephalic Neck: Supple, no JVD. No cervical lymphadenopathy.  Chest: Good air entry bilaterally, CTAB.  No  rales/rhonchi/wheezing CVS: S1 S2 regular, no murmurs.  B hands and wrists full S&ROM, normal grip B.   Extremities: B/L Lower Ext shows no edema, both legs are warm to touch Neurology: Awake alert, and oriented X 3, CN II-XII intact, Non focal Skin: No Rash  Data Review Lab Results  Component Value Date   HGBA1C 5.9 (H) 11/26/2021   HGBA1C 5.7 (H) 09/02/2021   HGBA1C 5.6 05/12/2020    Assessment & Plan   1. Other hyperlipidemia Might be able to reduce dose pending labs - Comprehensive metabolic panel - Lipid panel - atorvastatin (LIPITOR) 20 MG tablet; Take 1 tablet (20 mg total) by mouth daily.  Dispense: 30 tablet; Refill: 6  2. Prediabetes Continue with exercise and healthy diet - Lipid panel - Hemoglobin A1c  3. Body aches Meloxicam sent.  Believe related to increased activity/exercise.  Will check TSH and electrolytes - TSH  4. Chronic tension-type headache, not intractable Some congestion.   - fluticasone (FLONASE) 50 MCG/ACT nasal spray; Place 2 sprays into both nostrils daily.  Dispense: 16 g; Refill: 6    Return in about 6 months (around 03/01/2023) for PCP for chronic conditions.  The patient was given clear instructions to go to ER or return to medical center if symptoms don't improve, worsen or new problems develop. The patient verbalized understanding. The patient was told to call to get lab results if they haven't heard anything in the next week.

## 2022-09-01 ENCOUNTER — Encounter (HOSPITAL_BASED_OUTPATIENT_CLINIC_OR_DEPARTMENT_OTHER): Payer: Self-pay | Admitting: Physical Therapy

## 2022-09-01 LAB — COMPREHENSIVE METABOLIC PANEL
ALT: 16 IU/L (ref 0–32)
AST: 18 IU/L (ref 0–40)
Albumin/Globulin Ratio: 2 (ref 1.2–2.2)
Albumin: 4.2 g/dL (ref 3.9–4.9)
Alkaline Phosphatase: 84 IU/L (ref 44–121)
BUN/Creatinine Ratio: 20 (ref 12–28)
BUN: 15 mg/dL (ref 8–27)
Bilirubin Total: 0.3 mg/dL (ref 0.0–1.2)
CO2: 25 mmol/L (ref 20–29)
Calcium: 10 mg/dL (ref 8.7–10.3)
Chloride: 102 mmol/L (ref 96–106)
Creatinine, Ser: 0.74 mg/dL (ref 0.57–1.00)
Globulin, Total: 2.1 g/dL (ref 1.5–4.5)
Glucose: 86 mg/dL (ref 70–99)
Potassium: 4.4 mmol/L (ref 3.5–5.2)
Sodium: 141 mmol/L (ref 134–144)
Total Protein: 6.3 g/dL (ref 6.0–8.5)
eGFR: 89 mL/min/{1.73_m2} (ref >59)

## 2022-09-01 LAB — LIPID PANEL
Chol/HDL Ratio: 2.3 ratio (ref 0.0–4.4)
Cholesterol, Total: 157 mg/dL (ref 100–199)
HDL: 69 mg/dL (ref >39)
LDL Chol Calc (NIH): 79 mg/dL (ref 0–99)
Triglycerides: 37 mg/dL (ref 0–149)
VLDL Cholesterol Cal: 9 mg/dL (ref 5–40)

## 2022-09-01 LAB — HEMOGLOBIN A1C
Est. average glucose Bld gHb Est-mCnc: 111 mg/dL
Hgb A1c MFr Bld: 5.5 % (ref 4.8–5.6)

## 2022-09-01 LAB — TSH: TSH: 1.55 u[IU]/mL (ref 0.450–4.500)

## 2022-09-02 ENCOUNTER — Telehealth: Payer: Self-pay | Admitting: *Deleted

## 2022-09-02 ENCOUNTER — Telehealth: Payer: Self-pay | Admitting: Family Medicine

## 2022-09-02 MED ORDER — ATORVASTATIN CALCIUM 10 MG PO TABS: 10.0000 mg | ORAL_TABLET | Freq: Every day | ORAL | 1 refills | Status: DC

## 2022-09-02 NOTE — Telephone Encounter (Signed)
Yes ma'am, done. 

## 2022-09-02 NOTE — Telephone Encounter (Signed)
Patient returned call and notified : Your labs look amazing!!! The lifestyle changes you have made are working!  Keep up the good work.  You are no longer in the prediabetes range.  Blood sugars are normal.  Your cholesterol is much improved-you may take 1/2 atorvastatin instead of a whole pill and we will continue to monitor.  Thanks, Financial planner, PA-C

## 2022-09-02 NOTE — Telephone Encounter (Signed)
Does this medication come in a 10 mg tablet?

## 2022-09-02 NOTE — Telephone Encounter (Signed)
Noted  

## 2022-09-02 NOTE — Telephone Encounter (Signed)
Pt called back and asked if her atorvastatin (LIPITOR) 20 MG tablet dose can be called in for a new prescription as '10mg'$  tabs instead / she is cutting the ones she has in half but asked if the next RX can be '10mg'$  / please advise

## 2022-09-05 ENCOUNTER — Ambulatory Visit: Payer: No Typology Code available for payment source | Admitting: Nutrition

## 2022-09-07 ENCOUNTER — Encounter (HOSPITAL_BASED_OUTPATIENT_CLINIC_OR_DEPARTMENT_OTHER): Payer: Self-pay | Admitting: Physical Therapy

## 2022-09-07 ENCOUNTER — Ambulatory Visit (HOSPITAL_BASED_OUTPATIENT_CLINIC_OR_DEPARTMENT_OTHER): Payer: No Typology Code available for payment source | Admitting: Physical Therapy

## 2022-09-07 DIAGNOSIS — R2689 Other abnormalities of gait and mobility: Secondary | ICD-10-CM

## 2022-09-07 DIAGNOSIS — M5416 Radiculopathy, lumbar region: Secondary | ICD-10-CM

## 2022-09-07 DIAGNOSIS — M5459 Other low back pain: Secondary | ICD-10-CM | POA: Diagnosis not present

## 2022-09-07 NOTE — Therapy (Addendum)
OUTPATIENT PHYSICAL THERAPY THORACOLUMBAR TREATMENT/discharge   Patient Name: Megan Allen MRN: [id number] DOB:[birth year], 66 y.o., female Today's Date: 08/25/2022 Past Medical History:  Diagnosis Date   Atypical chest pain 05/06/2021   Chronic back pain    Headache    Moderate mitral regurgitation 05/06/2021   Murmur    Muscle spasm    Obesity (BMI 30.0-34.9) 05/06/2021   Pure hypercholesterolemia 05/06/2021   Past Surgical History:  Procedure Laterality Date   ABDOMINAL HYSTERECTOMY     partial   BREAST CYST ASPIRATION     COLONOSCOPY WITH PROPOFOL N/A 10/12/2020   Procedure: COLONOSCOPY WITH PROPOFOL;  Surgeon: Eloise Harman, DO;  [facility]: [facility];  Service: Endoscopy;  Laterality: N/A;  12:45   POLYPECTOMY  10/12/2020   Procedure: POLYPECTOMY;  Surgeon: Eloise Harman, DO;  Location: [facility];  Service: Endoscopy;;   Patient Active Problem List   Diagnosis Date Noted   Hyperlipidemia 05/10/2022   Moderate mitral regurgitation 05/06/2021   Obesity (BMI 30.0-34.9) 05/06/2021   Pure hypercholesterolemia 05/06/2021   Atypical chest pain 05/06/2021   Insect bite of left thigh 06/25/2020   Chronic migraine 05/29/2018   Chronic nonintractable headache 01/30/2018   Blurry vision 01/30/2018   Back pain 10/13/2016   Pain in joint, shoulder region 09/13/2016   Pain in joint, ankle and foot 09/13/2016    PCP: Dr Rosalita Chessman   REFERRING PROVIDER: Rosalita Chessman   REFERRING DIAG: I09.73,Z32.99 (ICD-10-CM) - Chronic bilateral low back pain with right-sided sciatica  Rationale for Evaluation and Treatment Rehabilitation  THERAPY DIAG:  Other low back pain  Radiculopathy, lumbar region  Other abnormalities of gait and mobility  ONSET DATE: 2016 following a fall   SUBJECTIVE:                                                                                                                                                                                            SUBJECTIVE STATEMENT:  The patient reports the pain in both of her legs has resolved. She continues to feel pain in her back but it is better. She has been able to come off her DMII medication and she has cut her statin in half.  Eval The patient fell in 2016 on her right side. She has had right sided low back pain since that point. She has increased pain when she is standing and walking. She can lie down and it improves. She can lie on her back to decrease the pain. She can use heat or her bed massager to reduce her pain. She also had an ankle issue following the fall.  PERTINENT HISTORY:  Atypical chest pain; headache, Mitral valve regurgitation; muscle spasm, obesity ( has lost 16 pounds)   PAIN:  Are you having pain? Yes: NPRS scale: 4/10 gross bilateral LE  Pain location: L low back Pain description: acing Aggravating factors: standing and walking Relieving factors: rest    PRECAUTIONS: None  WEIGHT BEARING RESTRICTIONS No  FALLS:  Has patient fallen in last 6 months? No  LIVING ENVIRONMENT: Has steps and stairs into her house  OCCUPATION: retired  Hobbies: exercising   PLOF: Independent  PATIENT GOALS :  To have less pain in her back    OBJECTIVE:   DIAGNOSTIC FINDINGS:  X-ray: No evidence of acute fracture malalignment of the lumbar spine.   Degenerative disc disease most advanced at L5-S1, with associated facet disease.   PATIENT SURVEYS:  FOTO  decreased 2%     SCREENING FOR RED FLAGS: Bowel or bladder incontinence: No Spinal tumors: No Cauda equina syndrome: No Compression fracture: No checked after her fall but nothing  Abdominal aneurysm: No   COGNITION:           Overall cognitive status: Within functional limits for tasks assessed                          SENSATION: WFL   POSTURE: No Significant postural limitations   PALPATION: Significant trigger point in upper gluteal area and in lower lumbar spine area,   LUMBAR  ROM:    Active  A/PROM  eval   Flexion Full ROM  Full ROM   Extension Mild pain    Right lateral flexion No pain   Left lateral flexion No pain    Right rotation Mild pain at end range Mild pain   Left rotation Mild pain at end range  Mild pain    (Blank rows = not tested)   LOWER EXTREMITY ROM:      Active  Right  eval Left  eval  Hip flexion Pain with flexion passed 105  full  Hip extension      Hip abduction      Hip adduction      Hip internal rotation Painful towards end range Full   Hip external rotation      Knee flexion      Knee extension      Ankle dorsiflexion      Ankle plantarflexion      Ankle inversion      Ankle eversion       (Blank rows = not tested)   LOWER EXTREMITY MMT:     MMT Right eval Left eval Right 10/4 Left 10/4  Hip flexion 10.7 18.2 17.2 19.4  Hip extension        Hip abduction 19.6 23.9 27.4 24.5  Hip adduction        Hip internal rotation        Hip external rotation        Knee flexion        Knee extension 11.4 20.8 19.8 23.7  Ankle dorsiflexion        Ankle plantarflexion        Ankle inversion        Ankle eversion         (Blank rows = not tested)         GAIT: Right hip drop in single leg stance; decreased hip flexion on the left     DIAGNOSTIC FINDINGS:  X-ray: No evidence  of acute fracture malalignment of the lumbar spine.   Degenerative disc disease most advanced at L5-S1, with associated facet disease.  TODAY'S TREATMENT . 11/15 Pt seen for aquatic therapy today.  Treatment took place in water 3.25-4 ft in depth at the [facility] pool. Temp of water was 91.  Pt entered/exited the pool via stairs (alternating) independently with bilat rail.   Warm up: 3x sidestepping, retro walking, fwd walking with board resistance   Exercises:  standing QL stretch at edge of pool 10s 5x each way Standing hip hinge at pool wall 2x10  Standing march 3x with noodle across pool Standing hip ABD 2x10 Mini  squat at edge of pool with UE 2x15 Kickboard press and row 2x20 (TrA brace in quart squat position) SLS 30s 2x each side Standing shoulder ext with yellow buoys 2x10     Pt requires buoyancy for support and to offload joints with strengthening exercises. Viscosity of the water is needed for resistance of strengthening; water current perturbations provides challenge to standing balance unsupported, requiring increased core activation.    11/8 Pt seen for aquatic therapy today.  Treatment took place in water 3.25-4 ft in depth at the [facility] pool. Temp of water was 91.  Pt entered/exited the pool via stairs (alternating) independently with bilat rail.   Warm up: 3x sidestepping, retro walking, fwd walking with board resistance   Exercises:  standing QL stretch at edge of pool 10s 5x each way Standing hip hinge at pool wall 2x10  Standing march 3x with noodle across pool Standing hip ABD 2x10 Mini squat at edge of pool with UE 2x20 Kickboard press and row 2x20 (TrA brace in quart squat position) SLS 30s 2x each side Standing shoulder ext with yellow buoys 2x10     Pt requires buoyancy for support and to offload joints with strengthening exercises. Viscosity of the water is needed for resistance of strengthening; water current perturbations provides challenge to standing balance unsupported, requiring increased core activation.       PATIENT EDUCATION:  Education details: anatomy, exercise progression,  muscle firing,  envelope of function, HEP, POC  Person educated: Patient Education method: Explanation, Demonstration, Tactile cues, Verbal cues, and Handouts Education comprehension: verbalized understanding, returned demonstration, verbal cues required, tactile cues required, and needs further education   HOME EXERCISE PROGRAM: Access Code: [email or web address] URL: [email or web address] Date: 06/21/2022 Prepared by: Carolyne Littles  ASSESSMENT:  CLINICAL  IMPRESSION: The patient continues to tolerate pool therapy well. She had no significant increase in pain> We added in stair training today. We will continue to advance exercises as tolerated. We continue to focus on exercises that keep her on her feet more.   OBJECTIVE IMPAIRMENTS decreased activity tolerance, difficulty walking, decreased ROM, decreased strength, and pain.   ACTIVITY LIMITATIONS bending, standing, stairs, transfers, and locomotion level  PARTICIPATION LIMITATIONS: meal prep, cleaning, laundry, shopping, community activity, and yard work  PERSONAL FACTORS 3+ comorbidities: chronic headaches; mitral valve regurgitation   are also affecting patient's functional outcome.   REHAB POTENTIAL: Good  CLINICAL DECISION MAKING: Evolving/moderate complexity  EVALUATION COMPLEXITY: Moderate   GOALS: Goals reviewed with patient? Yes  SHORT TERM GOALS: Target date: 08/18/2022  Patient will increase gross leg strength by 5 lbs bilateral  Baseline: Goal status: overall increased achieved   2.  The patient will increase her hip IR and flexion to full and pain free on the right side.  Baseline:  Goal status: continues to be painful today  3.  The patient will be independent with base stretching and exercise program Baseline:  Goal status: independent but has not been doing much    LONG TERM GOALS: Target date: 09/08/2022  The patient will stand for 1 hour without increased pain in order to perform ADL's  Baseline:  Goal status: continues to be limited but was improving   2.  Patient will ambulate community distances without radicular pain  Baseline:  Goal status: radicular pain has improved but still not going community distances   3.  Patient will have complete program for low back management withiut  Baseline:  Goal status: working towards a complete program    PLAN: PT FREQUENCY: 1x/week  PT DURATION: 6 weeks  PLANNED INTERVENTIONS: Therapeutic exercises,  Therapeutic activity, Neuromuscular re-education, Balance training, Patient/Family education, Self Care, Joint mobilization, Dry Needling, Electrical stimulation, Spinal mobilization, Cryotherapy, Moist heat, Ultrasound, and Manual therapy.  PLAN FOR NEXT SESSION: assess tolerance to HEP; review basic table strengthening exercises; advance to gym exercises as tolerated. Consider manual therapy and dry needling PRN. Continue to progress HEP.  PHYSICAL THERAPY DISCHARGE SUMMARY  Visits from Start of Care: 9  Current functional level related to goals / functional outcomes: Mild to moderate improvement in back pain with functional activity   Remaining deficits: Continued back pain   Education / Equipment: HEP  Patient agrees to discharge. Patient goals were partially met. Patient is being discharged due to maximizing rehab potential  Carolyne Littles PT DPT discharge completed on 11/18/2022 08/25/2022, 11:40 AM

## 2022-09-13 ENCOUNTER — Ambulatory Visit: Payer: Self-pay | Admitting: *Deleted

## 2022-09-13 MED ORDER — NIRMATRELVIR/RITONAVIR (PAXLOVID)TABLET: 3.0000 | ORAL_TABLET | Freq: Two times a day (BID) | ORAL | 0 refills | Status: AC

## 2022-09-13 NOTE — Telephone Encounter (Signed)
Pt was called and informed of medication being sent to pharmacy. 

## 2022-09-13 NOTE — Telephone Encounter (Signed)
Routing to PCP for review.

## 2022-09-13 NOTE — Telephone Encounter (Signed)
  Chief Complaint: Covid Positive Symptoms: Subjective fever, chills, cough, mostly dry, "Coughing up a little phlegm sometimes." Runny nose, sore throat yesterday, not today. Frequency: Symptoms onset yesterday, tested yesterday. Pertinent Negatives: Patient denies SOB Disposition: '[]'$ ED /'[]'$ Urgent Care (no appt availability in office) / '[]'$ Appointment(In office/virtual)/ '[]'$  [facility]/ '[]'$ Home Care/ '[x]'$ Refused Recommended Disposition /'[]'$ [facility]/ '[]'$  Follow-up with PCP Additional Notes: Pt requesting Paxlovid. Husband positive as well and is on med. Advised appt. for med request, declines. Home care advise provided, self isolation guidelines reviewed.  Pt verbalizes understanding.  Please advise.  If appropriate, [facility].

## 2022-09-13 NOTE — Telephone Encounter (Signed)
  Patient called in tested positive for covid, yesterday, has chills, fever, cough, runny nose. She doesn't want an appt, she is asking for Plaxovid for [id number] [place]: [phone]: [phone]    Reason for Disposition  [1] COVID-19 diagnosed by positive lab test (e.g., PCR, rapid self-test kit) AND [2] mild symptoms (e.g., cough, fever, others) AND [[1]no complications or SOB  Answer Assessment - Initial Assessment Questions 1. COVID-19 DIAGNOSIS: "How do you know that you have COVID?" (e.g., positive lab test or self-test, diagnosed by doctor or NP/PA, symptoms after exposure).     Home 2. COVID-19 EXPOSURE: "Was there any known exposure to COVID before the symptoms began?" CDC Definition of close contact: within 6 feet (2 meters) for a total of 15 minutes or more over a 24-hour period.      husband 3. ONSET: "When did the COVID-19 symptoms start?"      Yesterday 4. WORST SYMPTOM: "What is your worst symptom?" (e.g., cough, fever, shortness of breath, muscle aches)     Cough 5. COUGH: "Do you have a cough?" If Yes, ask: "How bad is the cough?"       Yes 6. FEVER: "Do you have a fever?" If Yes, ask: "What is your temperature, how was it measured, and when did it start?"     Warm and chills 7. RESPIRATORY STATUS: "Describe your breathing?" (e.g., normal; shortness of breath, wheezing, unable to speak)      No  9. OTHER SYMPTOMS: "Do you have any other symptoms?"  (e.g., chills, fatigue, headache, loss of smell or taste, muscle pain, sore throat)     Mild sore throat 10. HIGH RISK DISEASE: "Do you have any chronic medical problems?" (e.g., asthma, heart or lung disease, weak immune system, obesity, etc.)        11. VACCINE: "Have you had the COVID-19 vaccine?" If Yes, ask: "Which one, how many shots, when did you get it?"       Vaccines and last booster 2 months  ago  Protocols used: Coronavirus (COVID-19) Diagnosed or Suspected-A-AH

## 2022-09-13 NOTE — Telephone Encounter (Signed)
Prescription sent to pharmacy.

## 2022-09-14 ENCOUNTER — Ambulatory Visit (HOSPITAL_BASED_OUTPATIENT_CLINIC_OR_DEPARTMENT_OTHER): Payer: No Typology Code available for payment source | Admitting: Physical Therapy

## 2022-09-26 ENCOUNTER — Ambulatory Visit: Payer: Self-pay | Admitting: *Deleted

## 2022-09-26 NOTE — Telephone Encounter (Signed)
Summary: covid symptoms   Patient has covid, symptoms for 3 days,  and is asking paxlovid, She has sneezing, coughing and stuffy head. [id number] [place] [address]: [phone]: [phone]     Treated with antiviral(Paxlovid)- over 2 weeks- finished 1 week ago- this could be rebound Reason for Disposition . [1] PERSISTING SYMPTOMS OF COVID-19 AND [2] symptoms BETTER (improving)  Answer Assessment - Initial Assessment Questions 1. COVID-19 DIAGNOSIS: "How do you know that you have COVID?" (e.g., positive lab test or self-test, diagnosed by doctor or NP/PA, symptoms after exposure).     + COVID home test- patient had antiviral treatment- finished 1 week ago 2. COVID-19 EXPOSURE: "Was there any known exposure to COVID before the symptoms began?" CDC Definition of close contact: within 6 feet (2 meters) for a total of 15 minutes or more over a 24-hour period.      Possible family exposure 3. ONSET: "When did the COVID-19 symptoms start?"      Thursday/Friday- sneezing/cough 4. WORST SYMPTOM: "What is your worst symptom?" (e.g., cough, fever, shortness of breath, muscle aches)     Cough, congestion 5. COUGH: "Do you have a cough?" If Yes, ask: "How bad is the cough?"       Yes-dry 6. FEVER: "Do you have a fever?" If Yes, ask: "What is your temperature, how was it measured, and when did it start?"     First day- none now 7. RESPIRATORY STATUS: "Describe your breathing?" (e.g., normal; shortness of breath, wheezing, unable to speak)      normal 8. BETTER-SAME-WORSE: "Are you getting better, staying the same or getting worse compared to yesterday?"  If getting worse, ask, "In what way?"     Better 9. OTHER SYMPTOMS: "Do you have any other symptoms?"  (e.g., chills, fatigue, headache, loss of smell or taste, muscle pain, sore throat)     Chills, weak- all better  now 10. HIGH RISK DISEASE: "Do you have any chronic  medical problems?" (e.g., asthma, heart or lung disease, weak immune system, obesity, etc.)       High cholesterol, heart disease 11. VACCINE: "Have you had the COVID-19 vaccine?" If Yes, ask: "Which one, how many shots, when did you get it?"       Yes- may not be current 12. PREGNANCY: "Is there any chance you are pregnant?" "When was your last menstrual period?"       na 13. O2 SATURATION MONITOR:  "Do you use an oxygen saturation monitor (pulse oximeter) at home?" If Yes, ask "What is your reading (oxygen level) today?" "What is your usual oxygen saturation reading?" (e.g., 95%)       na  Protocols used: Coronavirus (COVID-19) Diagnosed or Suspected-A-AH, Coronavirus (COVID-19) Persisting Symptoms Follow-up Call-A-AH

## 2022-09-26 NOTE — Telephone Encounter (Signed)
  Chief Complaint: rebound + COVID Symptoms: sneezing, cough-dry, congestion Frequency: symptoms started back Thursday/Friday Pertinent Negatives: Patient denies SOB, fever Disposition: '[]'$ ED /'[]'$ Urgent Care (no appt availability in office) / '[]'$ Appointment(In office/virtual)/ '[]'$  [facility]/ '[]'$ Home Care/ '[]'$ Refused Recommended Disposition /'[]'$ [facility]/ '[x]'$  Follow-up with PCP Additional Notes: Patient may be experiencing rebound - advised per CDC recommendations- restart isolation/treat symptoms- call if symptoms should increase/get worse- high risk for secondary infections. Will send note to provider for review and recommendations

## 2022-09-26 NOTE — Telephone Encounter (Signed)
Pt was called and she states that she will be taking OTC medications.

## 2022-09-26 NOTE — Telephone Encounter (Signed)
Routing to PCP for review.

## 2022-09-26 NOTE — Telephone Encounter (Signed)
Sent prescription for Paxlovid to the pharmacy on 09/13/2022.  Please advise her to go to urgent care she needs an in person visit.

## 2022-09-27 ENCOUNTER — Encounter: Payer: No Typology Code available for payment source | Admitting: Nutrition

## 2022-09-28 ENCOUNTER — Ambulatory Visit (HOSPITAL_BASED_OUTPATIENT_CLINIC_OR_DEPARTMENT_OTHER): Payer: No Typology Code available for payment source | Admitting: Physical Therapy

## 2022-10-04 ENCOUNTER — Ambulatory Visit (HOSPITAL_BASED_OUTPATIENT_CLINIC_OR_DEPARTMENT_OTHER): Payer: No Typology Code available for payment source | Admitting: Physical Therapy

## 2022-10-12 ENCOUNTER — Ambulatory Visit (HOSPITAL_BASED_OUTPATIENT_CLINIC_OR_DEPARTMENT_OTHER): Payer: No Typology Code available for payment source | Admitting: Physical Therapy

## 2022-10-19 ENCOUNTER — Ambulatory Visit (HOSPITAL_BASED_OUTPATIENT_CLINIC_OR_DEPARTMENT_OTHER): Payer: No Typology Code available for payment source | Admitting: Physical Therapy

## 2022-10-25 DIAGNOSIS — Z23 Encounter for immunization: Secondary | ICD-10-CM | POA: Diagnosis not present

## 2022-10-26 ENCOUNTER — Ambulatory Visit (HOSPITAL_BASED_OUTPATIENT_CLINIC_OR_DEPARTMENT_OTHER): Payer: No Typology Code available for payment source | Admitting: Physical Therapy

## 2022-10-31 ENCOUNTER — Encounter: Payer: Self-pay | Admitting: Nutrition

## 2022-10-31 ENCOUNTER — Encounter: Payer: No Typology Code available for payment source | Attending: Family Medicine | Admitting: Nutrition

## 2022-10-31 VITALS — Ht 66.0 in | Wt 159.0 lb

## 2022-10-31 DIAGNOSIS — E782 Mixed hyperlipidemia: Secondary | ICD-10-CM | POA: Insufficient documentation

## 2022-10-31 DIAGNOSIS — R7303 Prediabetes: Secondary | ICD-10-CM | POA: Insufficient documentation

## 2022-10-31 NOTE — Patient Instructions (Addendum)
Keep up the great job!! Focus on more whole foods, plant based foods Watch FORKS OVER KNIVES or GAME CHANGERS Aim for 25 grams of fiber per day.

## 2022-10-31 NOTE — Progress Notes (Signed)
Medical Nutrition Therapy  1020  1100 Primary concerns today: Pre DM and Hyperlipidemia  Referral diagnosis: E78.0,  Preferred learning style: Visual and hear  Learning readiness: Change in progress    NUTRITION ASSESSMENT Hyperlipidemia follow up 67 yr old bfemale with history of GDM when she was having kids and now has pre dm and hyperlipemia.  Changes:  Has been eating more plant based foods of dried beans, fruits and vegetables and following Lifestyle Medicine principals. Has cut out processed foods and junk food. Feels much betters. Lost 3 lbs, but has on winter clothes. Her weight at home is 155 lbs and in office is 159 lbs. Changes in Labs since last visit 9/23.   7/23                            1/24 TCHOL 266                  157 HDL 63                           69 TG 53                             37 LDL  195                        79 Wt 161 lbs               155 lbs on home scale  Her PCP has reduced her Atorvastin from 20 mg to 10 mg a day now. Still has issues siatic  nerve issues.. Had covid and has to  reschedule appointment with PT and water therapy. Still doing exercises at home.  Dr. Reduced her cholesterol medication of atorvastin to 10 mg. A1C 5.5% , down from 5.9 % and no longer prediabetic.  She is motivated and willing to work with lifestyle medicine to help reverse and improve her medical conditions of Hyperlipidemia, Pre DM and overall health.  Anthropometrics  Wt Readings from Last 3 Encounters:  08/31/22 159 lb (72.1 kg)  06/28/22 161 lb (73 kg)  05/10/22 168 lb (76.2 kg)   Ht Readings from Last 3 Encounters:  08/31/22 '5\' 6"'$  (1.676 m)  06/28/22 5' 6.5" (1.689 m)  05/10/22 5' 5.5" (1.664 m)   There is no height or weight on file to calculate BMI. '@BMIFA'$ @ Facility age limit for growth %iles is 20 years. Facility age limit for growth %iles is 20 years.    Clinical Medical Hx: Hyperlipidemia, Pre DM, GERD Medications: see chart Labs:  Lab  Results  Component Value Date   HGBA1C 5.5 08/31/2022      Latest Ref Rng & Units 08/31/2022    9:46 AM 05/13/2022   11:06 AM 09/02/2021   10:54 AM  CMP  Glucose 70 - 99 mg/dL 86  82  90   BUN 8 - 27 mg/dL '15  15  17   '$ Creatinine 0.57 - 1.00 mg/dL 0.74  0.79  0.87   Sodium 134 - 144 mmol/L 141  140  140   Potassium 3.5 - 5.2 mmol/L 4.4  4.0  4.9   Chloride 96 - 106 mmol/L 102  102  101   CO2 20 - 29 mmol/L '25  24  26   '$ Calcium 8.7 - 10.3 mg/dL 10.0  9.6  9.6   Total Protein 6.0 - 8.5 g/dL 6.3  6.5  6.7   Total Bilirubin 0.0 - 1.2 mg/dL 0.3  0.3  0.3   Alkaline Phos 44 - 121 IU/L 84  97  83   AST 0 - 40 IU/L '18  18  15   '$ ALT 0 - 32 IU/L '16  15  13    '$ Lipid Panel     Component Value Date/Time   CHOL 157 08/31/2022 0946   TRIG 37 08/31/2022 0946   HDL 69 08/31/2022 0946   CHOLHDL 2.3 08/31/2022 0946   CHOLHDL 3.5 09/13/2016 1202   VLDL 12 09/13/2016 1202   LDLCALC 79 08/31/2022 0946   LABVLDL 9 08/31/2022 0946    Notable Signs/Symptoms: None  Lifestyle & Dietary Hx Married and lives with her son and husband. She does the cooking and shopping in the home.  Estimated daily fluid intake: 45 oz Supplements: VIt D 3, Calcium Sleep: good Stress / self-care: none Current average weekly physical activity: works out 3 times per week on ellipical and hand weights.  24-Hr Dietary Recall First Meal: egg whites and oatmeal, fruit Snack: nuts Second Meal: beans and vegetables, fruit, water Snack: nuts Third Meal: salmon and vegetables, fruit, water Snack:  Beverages: water  Estimated Energy Needs Calories: 1500 Carbohydrate: 170g Protein: 112g Fat: 42g   NUTRITION DIAGNOSIS  NI-5.6.2 Excessive fat intake As related to Diet high in processed food previously.  As evidenced by TCHOL 266 mg/dl, Total NON HDL Chol 203 mg/dl and LDL 195 mg/dl..   NUTRITION INTERVENTION  Nutrition education (E-1) on the following topics:  Lifestyle Medicine  - Whole Food, Plant  Predominant Nutrition is highly recommended: Eat Plenty of vegetables, Mushrooms, fruits, Legumes, Whole Grains, Nuts, seeds in lieu of processed meats, processed snacks/pastries red meat, poultry, eggs.    -It is better to avoid simple carbohydrates including: Cakes, Sweet Desserts, Ice Cream, Soda (diet and regular), Sweet Tea, Candies, Chips, Cookies, Store Bought Juices, Alcohol in Excess of  1-2 drinks a day, Lemonade,  Artificial Sweeteners, Doughnuts, Coffee Creamers, "Sugar-free" Products, etc, etc.  This is not a complete list.....  Exercise: If you are able: 30 -60 minutes a day ,4 days a week, or 150 minutes a week.  The longer the better.  Combine stretch, strength, and aerobic activities.  If you were told in the past that you have high risk for cardiovascular diseases, you may seek evaluation by your heart doctor prior to initiating moderate to intense exercise programs.   Handouts Provided Include  Lifestyle Medicine handouts  Learning Style & Readiness for Change Teaching method utilized: Visual & Auditory  Demonstrated degree of understanding via: Teach Back  Barriers to learning/adherence to lifestyle change: None  Goals Established by Pt Goals  Keep up the great job!! Focus on more whole foods, plant based foods Watch FORKS OVER KNIVES or GAME CHANGERS Aim for 25 grams of fiber per day.   MONITORING & EVALUATION Dietary intake, weekly physical activity, and weight in 1 month.  Next Steps  Patient is to work on whole plant based food choices.Marland Kitchen

## 2022-11-25 LAB — GLUCOSE, POCT (MANUAL RESULT ENTRY): POC Glucose: 76 mg/dl (ref 70–99)

## 2022-11-25 NOTE — Progress Notes (Signed)
ABI obtained at 5 in 5 event- 1.0 result

## 2022-12-06 ENCOUNTER — Encounter: Payer: Self-pay | Admitting: *Deleted

## 2022-12-06 NOTE — Progress Notes (Signed)
Pt has established PCP, Dr. Margarita Rana, at [facility] - seen w/in past 12 months and future appt 5/24 - followed by nutrition management, individual SDOH addressed by PCP and dietician- pt has access to rehab, needed medications. No additional health equity team support indicated at this time.

## 2023-03-01 ENCOUNTER — Ambulatory Visit: Payer: No Typology Code available for payment source | Admitting: Family Medicine

## 2023-05-02 ENCOUNTER — Encounter: Payer: No Typology Code available for payment source | Attending: Family Medicine | Admitting: Nutrition

## 2023-05-02 ENCOUNTER — Encounter: Payer: Self-pay | Admitting: Nutrition

## 2023-05-02 VITALS — Ht 66.5 in | Wt 159.8 lb

## 2023-05-02 DIAGNOSIS — R7303 Prediabetes: Secondary | ICD-10-CM | POA: Insufficient documentation

## 2023-05-02 DIAGNOSIS — E782 Mixed hyperlipidemia: Secondary | ICD-10-CM | POA: Insufficient documentation

## 2023-05-02 NOTE — Progress Notes (Signed)
Medical Nutrition Therapy  1300  1330 Primary concerns today: Pre DM and Hyperlipidemia  Referral diagnosis: E78.0,  Preferred learning style: Visual and hear  Learning readiness: Change in progress    NUTRITION ASSESSMENT Hyperlipidemia follow up 67 yr old bfemale with history of GDM when she was having kids and now has pre dm and hyperlipemia.  Scheduled to see PCP in August 2024. Changes made:  Has been having to take care of her daughter with shoulder surgery. Recently restarted  30 minutes on ellipical 3 times per week Eating more foods from  more whole plant based foods from garden of fruits and vegetables. Eating more dried beans and fish and baked chicken. Wt Stable. Feels good. Sleeping better. Will see PCP soon to recheck her lipid levels.   She is motivated and willing to work with lifestyle medicine to help reverse and improve her medical conditions of Hyperlipidemia, Pre DM and overall health.  Anthropometrics  Wt Readings from Last 3 Encounters:  10/31/22 159 lb (72.1 kg)  08/31/22 159 lb (72.1 kg)  06/28/22 161 lb (73 kg)   Ht Readings from Last 3 Encounters:  10/31/22 5\' 6"  (1.676 m)  08/31/22 5\' 6"  (1.676 m)  06/28/22 5' 6.5" (1.689 m)   There is no height or weight on file to calculate BMI. @BMIFA @ Facility age limit for growth %iles is 20 years. Facility age limit for growth %iles is 20 years.    Clinical Medical Hx: Hyperlipidemia, Pre DM, GERD Medications: see chart Labs:  Lab Results  Component Value Date   HGBA1C 5.5 08/31/2022      Latest Ref Rng & Units 08/31/2022    9:46 AM 05/13/2022   11:06 AM 09/02/2021   10:54 AM  CMP  Glucose 70 - 99 mg/dL 86  82  90   BUN 8 - 27 mg/dL 15  15  17    Creatinine 0.57 - 1.00 mg/dL 1.61  0.96  0.45   Sodium 134 - 144 mmol/L 141  140  140   Potassium 3.5 - 5.2 mmol/L 4.4  4.0  4.9   Chloride 96 - 106 mmol/L 102  102  101   CO2 20 - 29 mmol/L 25  24  26    Calcium 8.7 - 10.3 mg/dL 40.9  9.6  9.6   Total  Protein 6.0 - 8.5 g/dL 6.3  6.5  6.7   Total Bilirubin 0.0 - 1.2 mg/dL 0.3  0.3  0.3   Alkaline Phos 44 - 121 IU/L 84  97  83   AST 0 - 40 IU/L 18  18  15    ALT 0 - 32 IU/L 16  15  13     Lipid Panel     Component Value Date/Time   CHOL 157 08/31/2022 0946   TRIG 37 08/31/2022 0946   HDL 69 08/31/2022 0946   CHOLHDL 2.3 08/31/2022 0946   CHOLHDL 3.5 09/13/2016 1202   VLDL 12 09/13/2016 1202   LDLCALC 79 08/31/2022 0946   LABVLDL 9 08/31/2022 0946    Notable Signs/Symptoms: None  Lifestyle & Dietary Hx Married and lives with her son and husband. She does the cooking and shopping in the home.  Estimated daily fluid intake: 45 oz Supplements: VIt D 3, Calcium Sleep: good Stress / self-care: none Current average weekly physical activity: works out 3 times per week on ellipical and hand weights.  24-Hr Dietary Recall First Meal: egg whites and oatmeal, fruit Snack: nuts Second Meal: beans and vegetables, fruit, water  Snack: nuts Third Meal: squash, green beans and turnip salad, chicken, water Snack:  Beverages: water  Estimated Energy Needs Calories: 1500 Carbohydrate: 170g Protein: 112g Fat: 42g   NUTRITION DIAGNOSIS  NI-5.6.2 Excessive fat intake As related to Diet high in processed food previously.  As evidenced by TCHOL 266 mg/dl, Total NON HDL Chol 409 mg/dl and LDL 811 mg/dl..   NUTRITION INTERVENTION  Nutrition education (E-1) on the following topics:  Lifestyle Medicine  - Whole Food, Plant Predominant Nutrition is highly recommended: Eat Plenty of vegetables, Mushrooms, fruits, Legumes, Whole Grains, Nuts, seeds in lieu of processed meats, processed snacks/pastries red meat, poultry, eggs.    -It is better to avoid simple carbohydrates including: Cakes, Sweet Desserts, Ice Cream, Soda (diet and regular), Sweet Tea, Candies, Chips, Cookies, Store Bought Juices, Alcohol in Excess of  1-2 drinks a day, Lemonade,  Artificial Sweeteners, Doughnuts, Coffee  Creamers, "Sugar-free" Products, etc, etc.  This is not a complete list.....  Exercise: If you are able: 30 -60 minutes a day ,4 days a week, or 150 minutes a week.  The longer the better.  Combine stretch, strength, and aerobic activities.  If you were told in the past that you have high risk for cardiovascular diseases, you may seek evaluation by your heart doctor prior to initiating moderate to intense exercise programs.   Handouts Provided Include  Lifestyle Medicine handouts  Learning Style & Readiness for Change Teaching method utilized: Visual & Auditory  Demonstrated degree of understanding via: Teach Back  Barriers to learning/adherence to lifestyle change: None  Goals Established by Pt Goals Keep up the great job! Aim for 25 grams of fiber per day.   MONITORING & EVALUATION Dietary intake, weekly physical activity, and lipids PRN.  Next Steps  Patient is to work on whole plant based food choices.Marland Kitchen

## 2023-05-02 NOTE — Patient Instructions (Signed)
Keep up the great job!! Aim to get in 25 of grams of fiber per day Focus on whole plant based foods.

## 2023-05-08 ENCOUNTER — Ambulatory Visit: Payer: No Typology Code available for payment source | Admitting: Nutrition

## 2023-05-11 ENCOUNTER — Encounter: Payer: No Typology Code available for payment source | Admitting: Family Medicine

## 2023-05-17 ENCOUNTER — Encounter: Payer: No Typology Code available for payment source | Admitting: Family Medicine

## 2023-05-22 ENCOUNTER — Ambulatory Visit: Payer: No Typology Code available for payment source | Admitting: Family Medicine

## 2023-05-28 ENCOUNTER — Other Ambulatory Visit: Payer: Self-pay | Admitting: Family Medicine

## 2023-05-29 ENCOUNTER — Ambulatory Visit: Payer: No Typology Code available for payment source | Attending: Family Medicine | Admitting: Family Medicine

## 2023-05-29 ENCOUNTER — Encounter: Payer: Self-pay | Admitting: Family Medicine

## 2023-05-29 VITALS — BP 102/66 | HR 64 | Temp 98.2°F | Ht 66.5 in | Wt 162.0 lb

## 2023-05-29 DIAGNOSIS — E2839 Other primary ovarian failure: Secondary | ICD-10-CM | POA: Diagnosis not present

## 2023-05-29 DIAGNOSIS — Z131 Encounter for screening for diabetes mellitus: Secondary | ICD-10-CM | POA: Diagnosis not present

## 2023-05-29 DIAGNOSIS — E559 Vitamin D deficiency, unspecified: Secondary | ICD-10-CM

## 2023-05-29 DIAGNOSIS — M654 Radial styloid tenosynovitis [de Quervain]: Secondary | ICD-10-CM | POA: Diagnosis not present

## 2023-05-29 DIAGNOSIS — N6459 Other signs and symptoms in breast: Secondary | ICD-10-CM | POA: Diagnosis not present

## 2023-05-29 DIAGNOSIS — E7849 Other hyperlipidemia: Secondary | ICD-10-CM | POA: Diagnosis not present

## 2023-05-29 MED ORDER — ATORVASTATIN CALCIUM 10 MG PO TABS: 10.0000 mg | ORAL_TABLET | Freq: Every day | ORAL | 1 refills | Status: DC

## 2023-05-29 MED ORDER — DICLOFENAC SODIUM 1 % EX GEL: 4.0000 g | Freq: Two times a day (BID) | CUTANEOUS | 1 refills | Status: AC | PRN

## 2023-05-29 NOTE — Progress Notes (Signed)
Subjective:  Patient ID: Megan Allen, female    DOB: 23-Oct-1956  Age: 67 y.o. MRN: [id number]  CC: Breast Discharge   HPI Raynetta Seegars Frack is a 67 y.o. year old female with a history of chronic low back pain, hyperlipidemia who presents today for an office visit.   Interval History: Discussed the use of AI scribe software for clinical note transcription with the patient, who gave verbal consent to proceed.   The patient presents with a two-week history of intermittent, severe right hand pain, localized to the base of the thumb. She describes the pain as achy and note that it is not constant but comes and goes. She has been self-managing the pain with a topical rub, which provides some relief. The patient is right-handed and has been using her hands a lot recently.  In addition, the patient reports a one-time occurrence of nipple discharge from the left breast. The discharge was described as dark and crusty, with no associated pain or lumps. She has a history of benign cysts in both breasts, identified on a previous ultrasound. Ultrasound from 08/2022: IMPRESSION: Benign cysts within both breast corresponding to the screening study findings.   The patient is currently on atorvastatin for cholesterol management, with the last check in November showing perfect levels. She has not had breakfast on the day of the appointment.        Past Medical History:  Diagnosis Date   Atypical chest pain 05/06/2021   Chronic back pain    Headache    Moderate mitral regurgitation 05/06/2021   Murmur    Muscle spasm    Obesity (BMI 30.0-34.9) 05/06/2021   Pure hypercholesterolemia 05/06/2021    Past Surgical History:  Procedure Laterality Date   ABDOMINAL HYSTERECTOMY     partial   BREAST CYST ASPIRATION     COLONOSCOPY WITH PROPOFOL N/A 10/12/2020   Procedure: COLONOSCOPY WITH PROPOFOL;  Surgeon: Lanelle Bal, DO;  [facility]: [facility];  Service: Endoscopy;  Laterality:  N/A;  12:45   POLYPECTOMY  10/12/2020   Procedure: POLYPECTOMY;  Surgeon: Lanelle Bal, DO;  Location: [facility];  Service: Endoscopy;;    Family History  Problem Relation Age of Onset   Hyperlipidemia Mother    Diabetes Father    Diabetes Brother    Heart disease Daughter    Stroke Maternal Grandfather     Social History   Socioeconomic History   Marital status: Married    Spouse name: Not on file   Number of children: 3   Years of education: some college   Highest education level: Not on file  Occupational History   Occupation: Hair Dresser  Tobacco Use   Smoking status: Never   Smokeless tobacco: Never  Vaping Use   Vaping status: Never Used  Substance and Sexual Activity   Alcohol use: No   Drug use: No   Sexual activity: Yes    Birth control/protection: Surgical  Other Topics Concern   Not on file  Social History Narrative   Lives at home with her husband.   Right-handed.   No caffeine per day.   Social Determinants of Health   Financial Resource Strain: Not on file  Food Insecurity: Not on file  Transportation Needs: Not on file  Physical Activity: Not on file  Stress: Not on file  Social Connections: Not on file    Allergies  Allergen Reactions   Bee Venom    Caffeine  Chest pain, heart racing    Outpatient Medications Prior to Visit  Medication Sig Dispense Refill   acetaminophen (TYLENOL) 650 MG CR tablet Take 650 mg by mouth as needed for pain. (Patient not taking: Reported on 05/29/2023)     Ascorbic Acid (VITAMIN C) 1000 MG tablet Take 1,000 mg by mouth daily. (Patient not taking: Reported on 05/29/2023)     bisacodyl (DULCOLAX) 5 MG EC tablet Take 5 mg by mouth daily as needed for moderate constipation. (Patient not taking: Reported on 05/29/2023)     Blood Glucose Monitoring Suppl (ONETOUCH VERIO) w/Device KIT Test blood sugar 3 times daily (Patient not taking: Reported on 05/29/2023) 1 kit 0   Calcium Carb-Cholecalciferol 600-800  MG-UNIT TABS Take 1 tablet by mouth daily. (Patient not taking: Reported on 05/29/2023)     Cholecalciferol (VITAMIN D3 PO) Take 2,000 Units by mouth daily. (Patient not taking: Reported on 05/29/2023)     Cyanocobalamin (VITAMIN B-12 PO) Take 3,000 mcg by mouth daily. (Patient not taking: Reported on 05/29/2023)     ECHINACEA PO Take 400 mg by mouth as needed. (Patient not taking: Reported on 05/29/2023)     EPINEPHrine 0.3 mg/0.3 mL IJ SOAJ injection Inject 0.3 mLs (0.3 mg total) into the muscle as needed for anaphylaxis (severe allergic reactions from bee or other insect stings resulting in shortness of breath). (Patient not taking: Reported on 05/29/2023) 1 each 0   famotidine (PEPCID) 40 MG tablet Take 1 tablet (40 mg total) by mouth daily. For five days. (Patient not taking: Reported on 05/29/2023) 5 tablet 0   fluticasone (FLONASE) 50 MCG/ACT nasal spray Place 2 sprays into both nostrils daily. (Patient not taking: Reported on 05/29/2023) 16 g 6   glucose blood (ONETOUCH VERIO) test strip Use as instructed (Patient not taking: Reported on 05/29/2023) 100 each 12   Lancets (ONETOUCH DELICA PLUS LANCET33G) MISC Use to test blood sugar 3 times a day (Patient not taking: Reported on 05/29/2023) 100 each 12   meloxicam (MOBIC) 7.5 MG tablet Take 1 tablet (7.5 mg total) by mouth daily. (Patient not taking: Reported on 05/29/2023) 30 tablet 0   methocarbamol (ROBAXIN) 750 MG tablet Take 1 tablet (750 mg total) by mouth every 8 (eight) hours as needed for muscle spasms. (Patient not taking: Reported on 05/29/2023) 90 tablet 1   Multiple Vitamin (MULTIVITAMIN) capsule Take 1 capsule by mouth daily. (Patient not taking: Reported on 05/29/2023)     UNABLE TO FIND Take 1 tablet by mouth as needed. OTC Calms for simple nervous tension and occasional sleeplessness. (Patient not taking: Reported on 05/29/2023)     atorvastatin (LIPITOR) 10 MG tablet Take 1 tablet (10 mg total) by mouth daily. 90 tablet 1   Diclofenac Sodium 2 % SOLN  Place onto the skin daily. (Patient not taking: Reported on 05/29/2023)     No facility-administered medications prior to visit.     ROS Review of Systems  Constitutional:  Negative for activity change and appetite change.  HENT:  Negative for sinus pressure and sore throat.   Respiratory:  Negative for chest tightness, shortness of breath and wheezing.   Cardiovascular:  Negative for chest pain and palpitations.  Gastrointestinal:  Negative for abdominal distention, abdominal pain and constipation.  Genitourinary: Negative.   Musculoskeletal: Negative.   Psychiatric/Behavioral:  Negative for behavioral problems and dysphoric mood.     Objective:  BP 102/66   Pulse 64   Temp 98.2 F (36.8 C) (Oral)   Ht 5'  6.5" (1.689 m)   Wt 162 lb (73.5 kg)   SpO2 100%   BMI 25.76 kg/m      05/29/2023   10:14 AM 05/02/2023    1:03 PM 11/25/2022   11:48 AM  BP/Weight  Systolic BP 102  161  Diastolic BP 66  84  Wt. (Lbs) 162 159.8   BMI 25.76 kg/m2 25.41 kg/m2       Physical Exam Constitutional:      Appearance: She is well-developed.  Cardiovascular:     Rate and Rhythm: Normal rate.     Heart sounds: Normal heart sounds. No murmur heard. Pulmonary:     Effort: Pulmonary effort is normal.     Breath sounds: Normal breath sounds. No wheezing or rales.  Chest:     Chest wall: No tenderness.  Breasts:    Right: Inverted nipple present. No nipple discharge.     Left: Inverted nipple present. No nipple discharge.  Abdominal:     General: Bowel sounds are normal. There is no distension.     Palpations: Abdomen is soft. There is no mass.     Tenderness: There is no abdominal tenderness.  Musculoskeletal:     Right lower leg: No edema.     Left lower leg: No edema.     Comments: Tenderness on palpation of right base of thumb in the region of base of thumb and extensor tendon  Neurological:     Mental Status: She is alert and oriented to person, place, and time.  Psychiatric:         Mood and Affect: Mood normal.        Latest Ref Rng & Units 08/31/2022    9:46 AM 05/13/2022   11:06 AM 09/02/2021   10:54 AM  CMP  Glucose 70 - 99 mg/dL 86  82  90   BUN 8 - 27 mg/dL 15  15  17    Creatinine 0.57 - 1.00 mg/dL 0.96  0.45  4.09   Sodium 134 - 144 mmol/L 141  140  140   Potassium 3.5 - 5.2 mmol/L 4.4  4.0  4.9   Chloride 96 - 106 mmol/L 102  102  101   CO2 20 - 29 mmol/L 25  24  26    Calcium 8.7 - 10.3 mg/dL 81.1  9.6  9.6   Total Protein 6.0 - 8.5 g/dL 6.3  6.5  6.7   Total Bilirubin 0.0 - 1.2 mg/dL 0.3  0.3  0.3   Alkaline Phos 44 - 121 IU/L 84  97  83   AST 0 - 40 IU/L 18  18  15    ALT 0 - 32 IU/L 16  15  13      Lipid Panel     Component Value Date/Time   CHOL 157 08/31/2022 0946   TRIG 37 08/31/2022 0946   HDL 69 08/31/2022 0946   CHOLHDL 2.3 08/31/2022 0946   CHOLHDL 3.5 09/13/2016 1202   VLDL 12 09/13/2016 1202   LDLCALC 79 08/31/2022 0946    CBC    Component Value Date/Time   WBC 5.4 09/02/2021 1054   WBC 6.9 01/11/2021 1628   RBC 4.74 09/02/2021 1054   RBC 4.39 01/11/2021 1628   HGB 14.3 09/02/2021 1054   HCT 42.8 09/02/2021 1054   PLT 194 09/02/2021 1054   MCV 90 09/02/2021 1054   MCH 30.2 09/02/2021 1054   MCH 31.0 01/11/2021 1628   MCHC 33.4 09/02/2021 1054   MCHC 33.4 01/11/2021  1628   RDW 13.2 09/02/2021 1054   LYMPHSABS 1.9 09/02/2021 1054   MONOABS 448 09/13/2016 1202   EOSABS 0.1 09/02/2021 1054   BASOSABS 0.0 09/02/2021 1054    Lab Results  Component Value Date   HGBA1C 5.5 08/31/2022    Assessment & Plan:      Lollie Sails tenosynovitis New onset of intermittent severe pain at the base of the right thumb for the past two weeks. No numbness or tingling. Likely tendinitis. -Topical anti-inflammatory cream as needed for pain. -Consider referral to orthopedics for possible corticosteroid injection if symptoms persist. -Use wrist brace  Breast Discharge/ inverted nipples One-time occurrence of crusty discharge from the  left nipple.  On further questioning this was not actually discharge but could also have been debris given she has inverted nipples . no associated pain or lumps. History of benign cysts in both breasts. -Continue monitoring for any persistent discharge. -Refer to breast specialist if discharge recurs.  Hyperlipidemia Well controlled on Atorvastatin. -Continue Atorvastatin. -Check cholesterol, liver, and kidney function today.  General Health Maintenance -Order bone density scan to screen for osteoporosis. -Recommend pneumonia vaccine from pharmacy. -Screen for diabetes with today's blood work. -Check Vitamin D levels today.          Meds ordered this encounter  Medications   atorvastatin (LIPITOR) 10 MG tablet    Sig: Take 1 tablet (10 mg total) by mouth daily.    Dispense:  90 tablet    Refill:  1    Dose change.   diclofenac Sodium (VOLTAREN) 1 % GEL    Sig: Apply 4 g topically 2 (two) times daily as needed.    Dispense:  100 g    Refill:  1    Follow-up: Return in about 6 months (around 11/29/2023) for Chronic medical conditions.       Hoy Register, MD, FAAFP. [facility] [place], [place] [phone]   05/29/2023, 12:59 PM

## 2023-05-29 NOTE — Telephone Encounter (Signed)
Request refilled 05/29/23, duplicate request.E-Prescribing Status: Receipt confirmed by pharmacy (05/29/2023 10:57 AM EDT).  Requested Prescriptions  Pending Prescriptions Disp Refills   atorvastatin (LIPITOR) 10 MG tablet [Pharmacy Med Name: ATORVASTATIN 10 MG TABLET] 90 tablet 1    Sig: TAKE 1 TABLET BY MOUTH EVERY DAY     Cardiovascular:  Antilipid - Statins Failed - 05/28/2023 12:21 AM      Failed - Lipid Panel in normal range within the last 12 months    Cholesterol, Total  Date Value Ref Range Status  08/31/2022 157 100 - 199 mg/dL Final   LDL Chol Calc (NIH)  Date Value Ref Range Status  08/31/2022 79 0 - 99 mg/dL Final   HDL  Date Value Ref Range Status  08/31/2022 69 >39 mg/dL Final   Triglycerides  Date Value Ref Range Status  08/31/2022 37 0 - 149 mg/dL Final         Passed - Patient is not pregnant      Passed - Valid encounter within last 12 months    Recent Outpatient Visits           Today Other hyperlipidemia   [place] [facility] Hoy Register, MD   9 months ago Other hyperlipidemia   [facility] [place], [place], [place]   1 year ago Chronic bilateral low back pain with right-sided sciatica   [place] [facility] Hoy Register, MD   1 year ago Annual physical exam   [place] [facility] [place] Hoy Register, MD   2 years ago Costochondritis   [place] [facility] Hoy Register, MD

## 2023-05-29 NOTE — Patient Instructions (Signed)
Dyslipidemia Dyslipidemia is an imbalance of waxy, fat-like substances (lipids) in the blood. The body needs lipids in small amounts. Dyslipidemia often involves a high level of cholesterol or triglycerides, which are types of lipids. Common forms of dyslipidemia include: High levels of LDL cholesterol. LDL is the type of cholesterol that causes fatty deposits (plaques) to build up in the blood vessels that carry blood away from the heart (arteries). Low levels of HDL cholesterol. HDL cholesterol is the type of cholesterol that protects against heart disease. High levels of HDL remove the LDL buildup from arteries. High levels of triglycerides. Triglycerides are a fatty substance in the blood that is linked to a buildup of plaques in the arteries. What are the causes? There are two main types of dyslipidemia: primary and secondary. Primary dyslipidemia is caused by changes (mutations) in genes that are passed down through families (inherited). These mutations cause several types of dyslipidemia. Secondary dyslipidemia may be caused by various risk factors that can lead to the disease, such as lifestyle choices and certain medical conditions. What increases the risk? You are more likely to develop this condition if you are an older man or if you are a woman who has gone through menopause. Other risk factors include: Having a family history of dyslipidemia. Taking certain medicines, including birth control pills, steroids, some diuretics, and beta-blockers. Eating a diet high in saturated fat. Smoking cigarettes or excessive alcohol intake. Having certain medical conditions such as diabetes, polycystic ovary syndrome (PCOS), kidney disease, liver disease, or hypothyroidism. Not exercising regularly. Being overweight or obese with too much belly fat. What are the signs or symptoms? In most cases, dyslipidemia does not usually cause any symptoms. In severe cases, very high lipid levels can  cause: Fatty bumps under the skin (xanthomas). A white or gray ring around the black center (pupil) of the eye. Very high triglyceride levels can cause inflammation of the pancreas (pancreatitis). How is this diagnosed? Your health care provider may diagnose dyslipidemia based on a routine blood test (fasting blood test). Because most people do not have symptoms of the condition, this blood testing (lipid profile) is done on adults age 20 and older and is repeated every 4-6 years. This test checks: Total cholesterol. This measures the total amount of cholesterol in your blood, including LDL cholesterol, HDL cholesterol, and triglycerides. A healthy number is below 200 mg/dL (5.17 mmol/L). LDL cholesterol. The target number for LDL cholesterol is different for each person, depending on individual risk factors. A healthy number is usually below 100 mg/dL (2.59 mmol/L). Ask your health care provider what your LDL cholesterol should be. HDL cholesterol. An HDL level of 60 mg/dL (1.55 mmol/L) or higher is best because it helps to protect against heart disease. A number below 40 mg/dL (1.03 mmol/L) for men or below 50 mg/dL (1.29 mmol/L) for women increases the risk for heart disease. Triglycerides. A healthy triglyceride number is below 150 mg/dL (1.69 mmol/L). If your lipid profile is abnormal, your health care provider may do other blood tests. How is this treated? Treatment depends on the type of dyslipidemia that you have and your other risk factors for heart disease and stroke. Your health care provider will have a target range for your lipid levels based on this information. Treatment for dyslipidemia starts with lifestyle changes, such as diet and exercise. Your health care provider may recommend that you: Get regular exercise. Make changes to your diet. Quit smoking if you smoke. Limit your alcohol intake. If diet   changes and exercise do not help you reach your goals, your health care provider  may also prescribe medicine to lower lipids. The most commonly prescribed type of medicine lowers your LDL cholesterol (statin drug). If you have a high triglyceride level, your provider may prescribe another type of drug (fibrate) or an omega-3 fish oil supplement, or both. Follow these instructions at home: Eating and drinking  Follow instructions from your health care provider or dietitian about eating or drinking restrictions. Eat a healthy diet as told by your health care provider. This can help you reach and maintain a healthy weight, lower your LDL cholesterol, and raise your HDL cholesterol. This may include: Limiting your calories, if you are overweight. Eating more fruits, vegetables, whole grains, fish, and lean meats. Limiting saturated fat, trans fat, and cholesterol. Do not drink alcohol if: Your health care provider tells you not to drink. You are pregnant, may be pregnant, or are planning to become pregnant. If you drink alcohol: Limit how much you have to: 0-1 drink a day for women. 0-2 drinks a day for men. Know how much alcohol is in your drink. In the [place] one drink equals one 12 oz bottle of beer (355 mL), one 5 oz glass of wine (148 mL), or one 1 oz glass of hard liquor (44 mL). Activity Get regular exercise. Start an exercise and strength training program as told by your health care provider. Ask your health care provider what activities are safe for you. Your health care provider may recommend: 30 minutes of aerobic activity 4-6 days a week. Brisk walking is an example of aerobic activity. Strength training 2 days a week. General instructions Do not use any products that contain nicotine or tobacco. These products include cigarettes, chewing tobacco, and vaping devices, such as e-cigarettes. If you need help quitting, ask your health care provider. Take over-the-counter and prescription medicines only as told by your health care provider. This includes  supplements. Keep all follow-up visits. This is important. Contact a health care provider if: You are having trouble sticking to your exercise or diet plan. You are struggling to quit smoking or to control your use of alcohol. Summary Dyslipidemia often involves a high level of cholesterol or triglycerides, which are types of lipids. Treatment depends on the type of dyslipidemia that you have and your other risk factors for heart disease and stroke. Treatment for dyslipidemia starts with lifestyle changes, such as diet and exercise. Your health care provider may prescribe medicine to lower lipids. This information is not intended to replace advice given to you by your health care provider. Make sure you discuss any questions you have with your health care provider. Document Revised: 05/13/2022 Document Reviewed: 12/14/2020 Elsevier Patient Education  2024 Elsevier Inc.  

## 2023-05-30 ENCOUNTER — Telehealth: Payer: Self-pay

## 2023-05-30 NOTE — Telephone Encounter (Signed)
Copied from CRM [id number]. Topic: General - Other >> May 29, 2023  4:47 PM Turkey B wrote: Reason for CRM: pt called in states the med, for pain, diclofenac Sodium (VOLTAREN) 1 % GEL  [facility] [place]. She is going to check in [place] at a pharmacy there and call back if she finds out its available there.

## 2023-05-30 NOTE — Telephone Encounter (Signed)
-----   Message from [place] sent at 05/30/2023  8:42 AM EDT ----- Please inform her that vitamin D is normal, kidney and liver functions are normal, cholesterol is normal, Labs reveal prediabetes with an A1c of 5.9.  Working on a low carbohydrate diet, exercise, weight loss is recommended in order to prevent progression to type 2 diabetes mellitus.

## 2023-05-30 NOTE — Telephone Encounter (Signed)
Pt was called and is aware of results, DOB was confirmed.  ?

## 2023-05-31 NOTE — Telephone Encounter (Signed)
Noted  

## 2023-06-14 ENCOUNTER — Telehealth (INDEPENDENT_AMBULATORY_CARE_PROVIDER_SITE_OTHER): Payer: Self-pay | Admitting: Family Medicine

## 2023-06-14 ENCOUNTER — Other Ambulatory Visit: Payer: Self-pay | Admitting: Family Medicine

## 2023-06-14 NOTE — Telephone Encounter (Signed)
Copied from CRM [id number]. Topic: General - Other >> Jun 14, 2023  4:05 PM Phill Myron wrote: IT consultant fax [phone].... item wrist and hand brace  two or ambidextrous

## 2023-06-14 NOTE — Telephone Encounter (Signed)
Medication Refill - Medication: Blood Glucose Monitoring Suppl (ONETOUCH VERIO) w/Device KIT  Has the patient contacted their pharmacy? Yes.   (  Preferred Pharmacy (with phone number or street name):   [id number] [place] [place] - [address]  [address], [place] [address]       Has the patient been seen for an appointment in the last year OR does the patient have an upcoming appointment? Yes.    Agent: Please be advised that RX refills may take up to 3 business days. We ask that you follow-up with your pharmacy.

## 2023-06-15 MED ORDER — ONETOUCH VERIO W/DEVICE KIT: PACK | 0 refills | Status: AC

## 2023-06-15 NOTE — Telephone Encounter (Signed)
The patient called back in checking on the status of her order to [employer]. She said her insurance is waiting for the order to be called in so they can pay for it. Please assist patient further

## 2023-06-15 NOTE — Telephone Encounter (Signed)
Requested medication (s) are due for refill today: expired medication  Requested medication (s) are on the active medication list: yes   Last refill:  05/19/22 # 1 kit 0 refills   Future visit scheduled: no   Notes to clinic:  expired medication . Do you want to reorder Rx? Patient requesting refill.     Requested Prescriptions  Pending Prescriptions Disp Refills   Blood Glucose Monitoring Suppl (ONETOUCH VERIO) w/Device KIT 1 kit 0    Sig: Test blood sugar 3 times daily     Endocrinology: Diabetes - Testing Supplies Passed - 06/15/2023  8:32 AM      Passed - Valid encounter within last 12 months    Recent Outpatient Visits           2 weeks ago Other hyperlipidemia   [facility] Hoy Register, MD   9 months ago Other hyperlipidemia   [facility] [place], [place]   1 year ago Chronic bilateral low back pain with right-sided sciatica   [facility] Hoy Register, MD   1 year ago Annual physical exam   [facility] Hoy Register, MD   2 years ago Costochondritis   [facility] Hoy Register, MD

## 2023-06-16 NOTE — Telephone Encounter (Signed)
Called patient, informed that PCP was out of the office since Monday but will complete paperwork upon return and fax to [employer] [phone] .   Patient verbalized understanding and appreciative of the call.

## 2023-06-19 NOTE — Telephone Encounter (Signed)
Pt is requesting script for wrist and hand brace.

## 2023-06-21 MED ORDER — MISC. DEVICES MISC: 0 refills | Status: DC

## 2023-06-21 NOTE — Addendum Note (Signed)
Addended by: Hoy Register on: 06/21/2023 09:39 AM   Modules accepted: Orders

## 2023-06-21 NOTE — Telephone Encounter (Signed)
Can you please print out order from [facility]? I will sign when I am back in the office. Thank you!

## 2023-06-22 ENCOUNTER — Other Ambulatory Visit: Payer: Self-pay

## 2023-06-22 ENCOUNTER — Telehealth (INDEPENDENT_AMBULATORY_CARE_PROVIDER_SITE_OTHER): Payer: Self-pay | Admitting: Family Medicine

## 2023-06-22 MED ORDER — ONETOUCH DELICA PLUS LANCET33G MISC: 12 refills | Status: DC

## 2023-06-22 MED ORDER — ONETOUCH VERIO VI STRP: ORAL_STRIP | 12 refills | Status: DC

## 2023-06-22 MED ORDER — MISC. DEVICES MISC: 0 refills | Status: DC

## 2023-06-22 NOTE — Telephone Encounter (Signed)
Pt has been called and informed that order will be faxed once signed by PCP, [facility]

## 2023-06-22 NOTE — Telephone Encounter (Signed)
Order has been printed and  placed in box for signature.

## 2023-06-22 NOTE — Telephone Encounter (Signed)
Copied from CRM [id number]. Topic: General - Other >> Jun 21, 2023  4:37 PM Megan Allen wrote: Reason for CRM: The patient has called to follow up on the status of previous requested orders for an arms brace and diabetic testing supplies and their coverage under [facility]   Please contact further when possible to discuss with patient

## 2023-07-05 DIAGNOSIS — Z008 Encounter for other general examination: Secondary | ICD-10-CM | POA: Diagnosis not present

## 2023-07-05 DIAGNOSIS — E785 Hyperlipidemia, unspecified: Secondary | ICD-10-CM | POA: Diagnosis not present

## 2023-07-05 DIAGNOSIS — Z6824 Body mass index (BMI) 24.0-24.9, adult: Secondary | ICD-10-CM | POA: Diagnosis not present

## 2023-07-28 ENCOUNTER — Telehealth: Payer: Self-pay

## 2023-07-28 NOTE — Telephone Encounter (Signed)
Copied from CRM [id number]. Topic: General - Other >> Jul 28, 2023  2:12 PM Everette C wrote: Reason for CRM: The patient has called for an update on the submissions of the order for their hand and wrist brace   Please contact further when possible

## 2023-07-31 NOTE — Telephone Encounter (Signed)
Pt has been informed that her insurance will not cover.

## 2023-08-09 ENCOUNTER — Other Ambulatory Visit: Payer: Self-pay | Admitting: Family Medicine

## 2023-08-09 DIAGNOSIS — Z0000 Encounter for general adult medical examination without abnormal findings: Secondary | ICD-10-CM

## 2023-08-10 ENCOUNTER — Telehealth: Payer: Self-pay

## 2023-08-10 DIAGNOSIS — N6489 Other specified disorders of breast: Secondary | ICD-10-CM

## 2023-08-10 NOTE — Telephone Encounter (Signed)
Pt states that she was informed to have a diagnostic MM due to her having a sore(scab) on her breast.

## 2023-08-10 NOTE — Telephone Encounter (Signed)
Copied from CRM [id number]. Topic: Referral - Status >> Aug 09, 2023  4:53 PM Macon Large wrote: Reason for CRM: Pt requests that a referral for diagnostic mammogram be ordered. Cb# [phone]

## 2023-08-11 NOTE — Telephone Encounter (Signed)
Order has been placed.

## 2023-08-11 NOTE — Addendum Note (Signed)
Addended by: Hoy Register on: 08/11/2023 12:07 PM   Modules accepted: Orders

## 2023-08-15 NOTE — Telephone Encounter (Signed)
Pt has been scheduled.  °

## 2023-08-17 ENCOUNTER — Telehealth: Payer: Self-pay | Admitting: Family Medicine

## 2023-08-17 NOTE — Telephone Encounter (Signed)
Megan Allen called stated the brace for patients left hand should be sent to the medical supplier which is [facility] [phone] and fax [phone]. Please f/u with [facility].

## 2023-08-18 NOTE — Telephone Encounter (Signed)
Melvin from [employer] called in checking status of if order has been sent to [facility]. I gave him the info of this is where it should be sent

## 2023-08-25 NOTE — Telephone Encounter (Signed)
[facility], and the pt called in requesting an update on the order for hand brace. [facility] still has not received the order asked that this be sent in as soon as possible.  Please advise.

## 2023-08-25 NOTE — Telephone Encounter (Signed)
Will have to wait till nurse is back in the office

## 2023-08-28 ENCOUNTER — Telehealth: Payer: Self-pay | Admitting: Family Medicine

## 2023-08-28 ENCOUNTER — Other Ambulatory Visit: Payer: Self-pay

## 2023-08-28 MED ORDER — MISC. DEVICES MISC: 0 refills | Status: AC

## 2023-08-28 NOTE — Telephone Encounter (Signed)
Copied from CRM [id number]. Topic: General - Other >> Aug 25, 2023  4:22 PM Phill Myron wrote:  Megan Allen with [facility] ... Questioning the status of the hand brace: Please Advise

## 2023-08-28 NOTE — Telephone Encounter (Signed)
Script has been re printed and will be faxed once PCP is back in ofice

## 2023-08-28 NOTE — Telephone Encounter (Signed)
Duplicate message. 

## 2023-08-30 ENCOUNTER — Telehealth: Payer: Self-pay

## 2023-08-30 ENCOUNTER — Other Ambulatory Visit: Payer: Self-pay | Admitting: Family Medicine

## 2023-08-30 ENCOUNTER — Ambulatory Visit
Admission: RE | Admit: 2023-08-30 | Discharge: 2023-08-30 | Disposition: A | Discharge status: Home / Self Care | Payer: No Typology Code available for payment source | Source: Ambulatory Visit | Attending: Family Medicine | Admitting: Family Medicine

## 2023-08-30 DIAGNOSIS — N6452 Nipple discharge: Secondary | ICD-10-CM

## 2023-08-30 DIAGNOSIS — N6489 Other specified disorders of breast: Secondary | ICD-10-CM

## 2023-08-30 DIAGNOSIS — N6459 Other signs and symptoms in breast: Secondary | ICD-10-CM | POA: Diagnosis not present

## 2023-08-30 NOTE — Telephone Encounter (Unsigned)
Copied from CRM [id number]. Topic: General - Other >> Aug 30, 2023  1:36 PM Macon Large wrote: Reason for CRM: Alinda Money with [facility] called for an update on the requests for hand brace. Alinda Money requests that the Rx be faxed to [phone]

## 2023-08-30 NOTE — Telephone Encounter (Unsigned)
Copied from CRM [id number]. Topic: General - Other >> Aug 30, 2023 10:32 AM Clide Dales wrote: Bronson Ing with [facility] called to let provider know she sent a STAT order requesting in Epic and is also sending it via fax for Dr. Baxter Flattery signature. Bronson Ing states that the patient is in a room waiting.

## 2023-08-31 NOTE — Telephone Encounter (Signed)
Script for hand brace has been faxed to number provided.

## 2023-09-07 NOTE — Telephone Encounter (Addendum)
Tresa Endo calling from [facility] is calling to advise of a correct fax number to [facility] Fax- [phone] Phone- [phone]

## 2023-09-11 NOTE — Telephone Encounter (Signed)
Order has been faxed to number provided .

## 2023-09-13 ENCOUNTER — Other Ambulatory Visit (HOSPITAL_BASED_OUTPATIENT_CLINIC_OR_DEPARTMENT_OTHER): Payer: No Typology Code available for payment source | Admitting: Pharmacist

## 2023-09-13 DIAGNOSIS — E7800 Pure hypercholesterolemia, unspecified: Secondary | ICD-10-CM

## 2023-09-13 NOTE — Progress Notes (Signed)
Pharmacy Quality Measure Review  This patient is appearing on a report for being at risk of failing the adherence measure for cholesterol (statin) medications this calendar year.   Medication: atorvastatin Last fill date: 09/08/2023 for 90 day supply. Abs fail date of 08/12/2023 on this report was based on a last fill of 04/22/2023. Insurance report was not up to date. No action needed at this time.   Butch Penny, PharmD, P[place]y [place], CPP Clinical Pharmacist [facility] [phone]

## 2023-09-15 ENCOUNTER — Telehealth: Payer: Self-pay

## 2023-09-15 NOTE — Telephone Encounter (Signed)
Copied from CRM [id number]. Topic: General - Other >> Sep 15, 2023 10:11 AM Macon Large wrote: Reason for CRM: Tresa Endo with [facility] called for an update regarding Rx for hand brace.Tresa Endo stated that she contacted [facility] but was told that they had not received anything. [facility] fax# [phone]. Tresa Endo asked that someone please contact [facility] at [phone]

## 2023-09-15 NOTE — Telephone Encounter (Signed)
Call placed to [facility] to see if fax was received and agent confirmed that fax has not come thru. Fax was originally sent on 09/01/2023 with conformation. Fax number was verified and script has been faxed to [phone].

## 2023-10-02 DIAGNOSIS — H524 Presbyopia: Secondary | ICD-10-CM | POA: Diagnosis not present

## 2023-10-02 DIAGNOSIS — Z0100 Encounter for examination of eyes and vision without abnormal findings: Secondary | ICD-10-CM | POA: Diagnosis not present

## 2023-10-06 DIAGNOSIS — M1832 Unilateral post-traumatic osteoarthritis of first carpometacarpal joint, left hand: Secondary | ICD-10-CM | POA: Diagnosis not present

## 2023-10-16 ENCOUNTER — Telehealth: Payer: Self-pay | Admitting: Family Medicine

## 2023-10-16 NOTE — Telephone Encounter (Signed)
[place] you [place].

## 2023-10-16 NOTE — Telephone Encounter (Signed)
Copied from CRM [id number]. Topic: General - Other >> Oct 13, 2023 10:34 AM Franchot Heidelberg wrote: Reason for CRM: Standard written order form faxed over 10/06/2023. [facility] wants to know if this was received.   Melissa from [facility]  Best contact: [phone]

## 2023-11-01 ENCOUNTER — Encounter: Payer: Self-pay | Admitting: Physician Assistant

## 2023-11-01 ENCOUNTER — Ambulatory Visit: Payer: No Typology Code available for payment source | Attending: Physician Assistant | Admitting: Physician Assistant

## 2023-11-01 VITALS — BP 105/68 | HR 69 | Wt 162.0 lb

## 2023-11-01 DIAGNOSIS — Z23 Encounter for immunization: Secondary | ICD-10-CM

## 2023-11-01 DIAGNOSIS — M79645 Pain in left finger(s): Secondary | ICD-10-CM | POA: Diagnosis not present

## 2023-11-01 DIAGNOSIS — H9193 Unspecified hearing loss, bilateral: Secondary | ICD-10-CM

## 2023-11-01 NOTE — Progress Notes (Signed)
 Patient ID: Megan Allen, female   DOB: July 16, 1956, 68 y.o.   MRN: [id number]   Megan Allen, is a 68 y.o. female  [id number]:[id number]  FMW:[id number]  DOB - 13-Sep-1956  Chief Complaint  Patient presents with   Hearing Problem   Ear Pain       Subjective:   Megan Allen is a 68 y.o. female here today complaining of decreased hearing and people telling her she needs to get her hearing checked.  She denies any injury to her ears and this has been happening for a while.   She has obtained and thumb spica splint for her L hand and it does not seem to be helping much.  She is wearing it now and is sleeping in it.  She would like to see a specialist  No problems updated.  ALLERGIES: Allergies  Allergen Reactions   Bee Venom    Caffeine     Chest pain, heart racing    PAST MEDICAL HISTORY: Past Medical History:  Diagnosis Date   Atypical chest pain 05/06/2021   Chronic back pain    Headache    Moderate mitral regurgitation 05/06/2021   Murmur    Muscle spasm    Obesity (BMI 30.0-34.9) 05/06/2021   Pure hypercholesterolemia 05/06/2021    MEDICATIONS AT HOME: Prior to Admission medications   Medication Sig Start Date End Date Taking? Authorizing Provider  acetaminophen (TYLENOL) 650 MG CR tablet Take 650 mg by mouth as needed for pain.   Yes [provider]  Ascorbic Acid (VITAMIN C) 1000 MG tablet Take 1,000 mg by mouth daily.   Yes [provider]  atorvastatin  (LIPITOR) 10 MG tablet Take 1 tablet (10 mg total) by mouth daily. 05/29/23  Yes Newlin, Enobong, MD  bisacodyl (DULCOLAX) 5 MG EC tablet Take 5 mg by mouth daily as needed for moderate constipation.   Yes [provider]  Blood Glucose Monitoring Suppl (ONETOUCH VERIO) w/Device KIT Test blood sugar 3 times daily 06/15/23  Yes Newlin, Corrina, MD  Calcium  Carb-Cholecalciferol 600-800 MG-UNIT TABS Take 1 tablet by mouth daily.   Yes [provider]  Cholecalciferol (VITAMIN D3 PO)  Take 2,000 Units by mouth daily.   Yes [provider]  Cyanocobalamin (VITAMIN B-12 PO) Take 3,000 mcg by mouth daily.   Yes [provider]  diclofenac  Sodium (VOLTAREN ) 1 % GEL Apply 4 g topically 2 (two) times daily as needed. 05/29/23  Yes Newlin, Enobong, MD  ECHINACEA PO Take 400 mg by mouth as needed.   Yes [provider]  EPINEPHrine  0.3 mg/0.3 mL IJ SOAJ injection Inject 0.3 mLs (0.3 mg total) into the muscle as needed for anaphylaxis (severe allergic reactions from bee or other insect stings resulting in shortness of breath). 06/25/20  Yes Brien Belvie BRAVO, MD  famotidine  (PEPCID ) 40 MG tablet Take 1 tablet (40 mg total) by mouth daily. For five days. 06/25/20  Yes Newlin, Enobong, MD  fluticasone  (FLONASE ) 50 MCG/ACT nasal spray Place 2 sprays into both nostrils daily. 08/31/22  Yes Danton Slough M, PA-C  glucose blood (ONETOUCH VERIO) test strip Use as instructed 06/22/23  Yes Delbert Corrina, MD  Lancets (ONETOUCH DELICA PLUS LANCET33G) MISC Use to test blood sugar 3 times a day 06/22/23  Yes Newlin, Corrina, MD  meloxicam  (MOBIC ) 7.5 MG tablet Take 1 tablet (7.5 mg total) by mouth daily. 08/31/22  Yes Esbeidy Mclaine M, PA-C  methocarbamol  (ROBAXIN ) 750 MG tablet Take 1 tablet (750 mg total) by mouth  every 8 (eight) hours as needed for muscle spasms. 03/10/21  Yes Newlin, Enobong, MD  Misc. Devices MISC Left hand brace. Diagnosis - Everitt Hahn tenosynovitis 08/28/23  Yes Newlin, Enobong, MD  Multiple Vitamin (MULTIVITAMIN) capsule Take 1 capsule by mouth daily.   Yes [provider]  UNABLE TO FIND Take 1 tablet by mouth as needed. OTC Calms for simple nervous tension and occasional sleeplessness.   Yes [provider]    ROS: Neg resp Neg cardiac Neg GI Neg GU Neg psych Neg neuro  Objective:   Vitals:   11/01/23 0928  BP: 105/68  Pulse: 69  SpO2: 97%  Weight: 162 lb (73.5 kg)   Exam General appearance : Awake, alert, not in any  distress. Speech Clear. Not toxic looking HEENT: Atraumatic and Normocephalic.  B TM appear wnl.   Neck: Supple, no JVD. No cervical lymphadenopathy.  Chest: Good air entry bilaterally, CTAB.  No rales/rhonchi/wheezing CVS: S1 S2 regular, no murmurs.  There is a bony prominence of the L thumb base compared to R.  +finkelstein's.  N-V intact.  No swelling.  No snuffbox TTP.   Extremities: B/L Lower Ext shows no edema, both legs are warm to touch Neurology: Awake alert, and oriented X 3, CN II-XII intact, Non focal Skin: No Rash  Data Review Lab Results  Component Value Date   HGBA1C 5.9 (H) 05/29/2023   HGBA1C 5.5 08/31/2022   HGBA1C 5.9 (H) 11/26/2021    Assessment & Plan   1. Need for immunization against influenza (Primary) - Flu Vaccine Trivalent High Dose (Fluad)  2. Decreased hearing of both ears - Ambulatory referral to ENT  3. Thumb pain, left - Ambulatory referral to Hand Surgery    Return in about 2 months (around 12/30/2023) for PCP for chronic conditions-Newlin.  The patient was given clear instructions to go to ER or return to medical center if symptoms don't improve, worsen or new problems develop. The patient verbalized understanding. The patient was told to call to get lab results if they haven't heard anything in the next week.      Jon Moores, PA-C [facility] [employer], [place] [phone]   11/01/2023, 10:03 AM

## 2023-11-02 ENCOUNTER — Encounter: Payer: Self-pay | Admitting: Physician Assistant

## 2023-11-02 ENCOUNTER — Other Ambulatory Visit (INDEPENDENT_AMBULATORY_CARE_PROVIDER_SITE_OTHER): Payer: Self-pay

## 2023-11-02 ENCOUNTER — Encounter (INDEPENDENT_AMBULATORY_CARE_PROVIDER_SITE_OTHER): Payer: Self-pay | Admitting: Otolaryngology

## 2023-11-02 ENCOUNTER — Ambulatory Visit (INDEPENDENT_AMBULATORY_CARE_PROVIDER_SITE_OTHER): Payer: No Typology Code available for payment source | Admitting: Physician Assistant

## 2023-11-02 DIAGNOSIS — G8929 Other chronic pain: Secondary | ICD-10-CM

## 2023-11-02 DIAGNOSIS — M79645 Pain in left finger(s): Secondary | ICD-10-CM

## 2023-11-02 NOTE — Therapy (Signed)
 OUTPATIENT OCCUPATIONAL THERAPY ORTHO EVALUATION  Patient Name: Megan Allen MRN: [id number] DOB:Sep 18, 1956, 68 y.o., female Today's Date: 11/03/2023  PCP: Delbert Clam, MD REFERRING PROVIDER: Persons, Ronal Dragon, [place]   END OF SESSION:  OT End of Session - 11/03/23 1108     Visit Number 1    Number of Visits 6    Date for OT Re-Evaluation 12/08/23    Authorization Type [employer]    OT Start Time 1108    OT Stop Time 1145    OT Time Calculation (min) 37 min    Activity Tolerance Patient tolerated treatment well;No increased pain;Patient limited by fatigue;Patient limited by pain    Behavior During Therapy [facility] for tasks assessed/performed             Past Medical History:  Diagnosis Date   Atypical chest pain 05/06/2021   Chronic back pain    Headache    Moderate mitral regurgitation 05/06/2021   Murmur    Muscle spasm    Obesity (BMI 30.0-34.9) 05/06/2021   Pure hypercholesterolemia 05/06/2021   Past Surgical History:  Procedure Laterality Date   ABDOMINAL HYSTERECTOMY     partial   BREAST CYST ASPIRATION     COLONOSCOPY WITH PROPOFOL  N/A 10/12/2020   Procedure: COLONOSCOPY WITH PROPOFOL ;  Surgeon: Cindie Carlin POUR, DO;  [facility];  Service: Endoscopy;  Laterality: N/A;  12:45   POLYPECTOMY  10/12/2020   Procedure: POLYPECTOMY;  Surgeon: Cindie Carlin POUR, DO;  [facility];  Service: Endoscopy;;   Patient Active Problem List   Diagnosis Date Noted   Hyperlipidemia 05/10/2022   Moderate mitral regurgitation 05/06/2021   Obesity (BMI 30.0-34.9) 05/06/2021   Pure hypercholesterolemia 05/06/2021   Atypical chest pain 05/06/2021   Insect bite of left thigh 06/25/2020   Chronic migraine 05/29/2018   Chronic nonintractable headache 01/30/2018   Blurry vision 01/30/2018   Back pain 10/13/2016   Pain in joint, shoulder region 09/13/2016   Pain in joint, ankle and foot 09/13/2016    ONSET DATE: Sept 2024 pain  worsened  REFERRING DIAG:  M79.645,G89.29 (ICD-10-CM) - Chronic pain of left thumb    THERAPY DIAG:  Muscle weakness (generalized)  Pain in joint of left hand  Localized edema  Rationale for Evaluation and Treatment: Rehabilitation  SUBJECTIVE:   SUBJECTIVE STATEMENT: She states pain has been off/on for years, but worst since Sept 2024, and sometimes she drops things due to pain.    PERTINENT HISTORY: She states fall years ago, has tried voltaren  gel, uses thumb spica splint   PRECAUTIONS: None;  RED FLAGS:  None   WEIGHT BEARING RESTRICTIONS: No  PAIN:  Are you having pain? Yes: NPRS scale: 1/10 at rest now but up to 5-7/10 at worst in the past week Pain location: Lt thumb Pain description: Aching and sometimes sharp Aggravating factors: Weightbearing or heavy gripping Relieving factors: Supportive gloves  FALLS: Has patient fallen in last 6 months? No  PLOF: Independent  PATIENT GOALS: Decrease pain in left nondominant hand and thumb  NEXT MD VISIT: As needed   OBJECTIVE: (All objective assessments below are from initial evaluation on: 11/03/23 unless otherwise specified.)   HAND DOMINANCE: Right   ADLs: Overall ADLs: States decreased ability to grab, hold household objects, pain and difficulty to open containers, perform FMS tasks (manipulate fasteners on clothing), mild to moderate bathing problems as well.   FUNCTIONAL OUTCOME MEASURES: Eval: Quick DASH 30% impairment today  (Higher % Score  =  More Impairment)    UPPER EXTREMITY ROM     Shoulder to Wrist AROM Right eval Left eval  Forearm supination    Forearm pronation     Wrist flexion 71 75  Wrist extension 78 71  Functional dart thrower's motion (F-DTM) in ulnar flexion    F-DTM in radial extension     (Blank rows = not tested)   Hand AROM Left eval  Full Fist Ability (or Gap to Distal Palmar Crease) full  Thumb Opposition  (Kapandji Scale)  10/10  (Blank rows = not tested)   UPPER  EXTREMITY MMT:     MMT Left 11/03/23  Thumb flexion 5/5 MMT  Thumb extension Painful 4-/5 MMT  Forearm supination   Forearm pronation   Wrist flexion   Wrist extension   Wrist ulnar deviation   Wrist radial deviation   (Blank rows = not tested)  HAND FUNCTION: Eval: Observed weakness in affected Lt hand and mild pain when compared to the right hand Grip strength Right: 42 lbs, Left: 31 lbs   COORDINATION: Eval: No significant observed coordination impairments with affected Lt hand.  Details will be tested if needed 9 Hole Peg Test Right: TBD sec, Left: TBD sec (TBD sec is WFL)   SENSATION: Eval:  Light touch intact today  EDEMA:   Eval:  Mildly swollen in left thumb CMC joint when compared to right thumb CMC joint today.    COGNITION: Eval: Overall cognitive status: WFL for evaluation today   OBSERVATIONS:   Eval: She has a positive grind test, positive pain with resisted thumb extension.  Tender around the [facility] joint of the left thumb, and nontender in the first dorsal compartment.  STT joint not overly tender, and x-rays seem to show small bone spur and CMC joint   TODAY'S TREATMENT:  Post-evaluation treatment:   She was given the following information for self-care/safety health to help manage hand and thumb arthritis:   How you will be successful:  Change habits/routines, prevent degeneration and pain Exercise program to "open" joint spaces, relieve pressure and tension Protect with supportive bracing   Avoid:  Repetitive, heavy activities or prolonged postures  Things that hurt or cause your hands to swell Direct pressure on sore joints  Specifics:  Do a warm soak every morning for 3-5 minutes (or a heating pad/hot shower, etc.). This loosens up stiff hands/wrists.  Start every day with a non-painful stretch routine assigned by your therapist.  Stretch again, once or twice that day, thinking about what you have planned.   It's smart to gently warmup hands and  stretch before typically stressful activities (or before bed if your hands hurt in the night).  If you must do repetitive or stressful activities, use some type of support (arthritis gloves, Kinesio tape, braces, etc.).  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  Take breaks for rest and recovery; don't carry too many bags at once or "force" yourself to finish a job.  Keep hands covered and warm in the winter or cool, rainy weather.  Additionally she was given a home exercise program to help stabilize the thumb including the following exercises.  Each 1 was explained to her and demonstrated to her and she performs back with no pain stating only mild soreness at the end of the session which should resolve shortly.  OT explains that her thumb should actually feel better after doing this program typically.  Exercises -  Seated Wrist Flexion Stretch  - 3 x daily - 3 reps - 15 hold - Wrist Prayer Stretch  - 3 x daily - 3 reps - 15 sec hold - Stretch Thumb DOWNWARD  - 3 x daily - 3 reps - 15 sec hold - Thumb Webspace Stretch  - 3 x daily - 3 reps - 15 sec hold - Towel Roll Grip with Forearm in Neutral  - 3 x daily - 5 reps - 10 sec hold - Spread Index Finger Apart  - 3 x daily - 5 reps - 5 sec hold - C-Strength (try using rubber band)   - 3 x daily - 5 reps - 5 sec hold    PATIENT EDUCATION: Education details: See tx section above for details  Person educated: Patient Education method: Verbal Instruction, Teach back, Handouts  Education comprehension: States and demonstrates understanding, Additional Education required    HOME EXERCISE PROGRAM: Access Code: [email or web address] URL: [email or web address] Date: 11/03/2023 Prepared by: Melvenia Ada   GOALS: Goals reviewed with patient? Yes   SHORT TERM GOALS: (STG required if POC>30 days) Target Date: 11/17/23  Pt will obtain protective, custom orthotic. Goal status:  TBD/PRN  2.  Pt will demo/state understanding of initial HEP to improve pain levels and prerequisite motion. Goal status: INITIAL   LONG TERM GOALS: Target Date: 12/08/23  Pt will improve functional ability by decreased impairment per Quick DASH assessment from 30% to 15% or better, for better quality of life. Goal status: INITIAL  2.  Pt will improve grip strength in Lt hand from tender 31 pounds to at least 40 pounds for functional use at home and in IADLs. Goal status: INITIAL  3.  Pt will improve A/ROM in Lt Wrist extension from 71 degrees to at least 75 degrees, to have functional motion for tasks like reach and grasp.  Goal status: INITIAL  4.  Pt will improve strength in Lt Thumb extension from painful 4 -/5 MMT to at least 4/5 nontender MMT to have increased functional ability to carry out selfcare and higher-level homecare tasks with less difficulty. Goal status: INITIAL  5.  Pt will decrease pain at worst from 5-7/10 to 3/10 or better to have better sleep and occupational participation in daily roles. Goal status: INITIAL  ASSESSMENT:  CLINICAL IMPRESSION: Patient is a 68 y.o. female who was seen today for occupational therapy evaluation for left hand and thumb chronic pain and decreased functional ability.  She will benefit from outpatient occupational therapy to decrease symptoms and increase strength and functional ability.   PERFORMANCE DEFICITS: in functional skills including ADLs, IADLs, coordination, dexterity, ROM, strength, pain, fascial restrictions, muscle spasms, Fine motor control, body mechanics, endurance, decreased knowledge of precautions, and UE functional use, cognitive skills including problem solving and safety awareness, and psychosocial skills including coping strategies and environmental adaptation.   IMPAIRMENTS: are limiting patient from ADLs, IADLs, and leisure.   COMORBIDITIES: may have co-morbidities  that affects occupational performance.  Patient will benefit from skilled OT to address above impairments and improve overall function.  MODIFICATION OR ASSISTANCE TO COMPLETE EVALUATION: No modification of tasks or assist necessary to complete an evaluation.  OT OCCUPATIONAL PROFILE AND HISTORY: Problem focused assessment: Including review of records relating to presenting problem.  CLINICAL DECISION MAKING: LOW - limited treatment options, no task modification necessary  REHAB POTENTIAL: Excellent  EVALUATION COMPLEXITY: Low      PLAN:  OT FREQUENCY: 1-2x/week  OT DURATION: 5 weeks through 12/08/2023 and up to  10 total visits as needed  PLANNED INTERVENTIONS: 97168 OT Re-evaluation, 97535 self care/ADL training, 02889 therapeutic exercise, 97530 therapeutic activity, 97112 neuromuscular re-education, 97140 manual therapy, 97035 ultrasound, 97039 fluidotherapy, 97010 moist heat, 97010 cryotherapy, 97034 contrast bath, 97760 Orthotics management and training, 02239 Splinting (initial encounter), [id number] Subsequent splinting/medication, compression bandaging, Dry needling, energy conservation, coping strategies training, patient/family education, and DME and/or AE instructions  RECOMMENDED OTHER SERVICES: None now  CONSULTED AND AGREED WITH PLAN OF CARE: Patient  PLAN FOR NEXT SESSION:   Review initial recommendations and home exercise program    Melvenia Ada, OTR/L, CHT 11/03/2023, 12:07 PM

## 2023-11-02 NOTE — Progress Notes (Signed)
 Office Visit Note   Patient: Megan Allen           Date of Birth: Mar 09, 1956           MRN: [id number] Visit Date: 11/02/2023              Requested by: Danton Jon HERO, PA-C [address] [place],  [place] [address] PCP: Delbert Clam, MD   Assessment & Plan: Visit Diagnoses: Arthritis left hand  Plan: Pleasant 68 year old woman presents with a chronic history of left base of thumb pain.  She said she had a fall years ago.  She has had ongoing pain in the base of the thumb since September.  She says it hurts when she tries to hold certain items she gets some swelling.  She has been using Tylenol and Voltaren  gel.  X-rays suggest first CMC arthritis.  Discussed with her the options as far as an ultrasound-guided injection versus physical therapy.  She would like to try physical therapy and has been referred  Follow-Up Instructions: As needed  Orders:  No orders of the defined types were placed in this encounter.  No orders of the defined types were placed in this encounter.     Procedures: No procedures performed   Clinical Data: No additional findings.   Subjective: No chief complaint on file.   HPI pleasant 68 year old woman with chronic history of left thumb pain.  She is right-hand dominant.  She thinks this may have began with an injury she had several years ago she is using a thumb spica splint  Review of Systems  All other systems reviewed and are negative.    Objective: Vital Signs: There were no vitals taken for this visit.  Physical Exam Constitutional:      Appearance: Normal appearance.  Pulmonary:     Effort: Pulmonary effort is normal.  Skin:    General: Skin is warm and dry.  Neurological:     General: No focal deficit present.     Mental Status: She is alert and oriented to person, place, and time.  Psychiatric:        Mood and Affect: Mood normal.        Behavior: Behavior normal.     Ortho Exam Left hand she  is neurovascularly intact no swelling no erythema.  She is able to oppose all her fingers grip strength is fair negative Finkelstein's test.  She does have focal pain at the base at the first [facility] joint Specialty Comments:  No specialty comments available.  Imaging: No results found.   PMFS History: Patient Active Problem List   Diagnosis Date Noted   Hyperlipidemia 05/10/2022   Moderate mitral regurgitation 05/06/2021   Obesity (BMI 30.0-34.9) 05/06/2021   Pure hypercholesterolemia 05/06/2021   Atypical chest pain 05/06/2021   Insect bite of left thigh 06/25/2020   Chronic migraine 05/29/2018   Chronic nonintractable headache 01/30/2018   Blurry vision 01/30/2018   Back pain 10/13/2016   Pain in joint, shoulder region 09/13/2016   Pain in joint, ankle and foot 09/13/2016   Past Medical History:  Diagnosis Date   Atypical chest pain 05/06/2021   Chronic back pain    Headache    Moderate mitral regurgitation 05/06/2021   Murmur    Muscle spasm    Obesity (BMI 30.0-34.9) 05/06/2021   Pure hypercholesterolemia 05/06/2021    Family History  Problem Relation Age of Onset   Hyperlipidemia Mother    Diabetes Father  Diabetes Brother    Heart disease Daughter    Stroke Maternal Grandfather     Past Surgical History:  Procedure Laterality Date   ABDOMINAL HYSTERECTOMY     partial   BREAST CYST ASPIRATION     COLONOSCOPY WITH PROPOFOL  N/A 10/12/2020   Procedure: COLONOSCOPY WITH PROPOFOL ;  Surgeon: Cindie Carlin POUR, DO;  [facility];  Service: Endoscopy;  Laterality: N/A;  12:45   POLYPECTOMY  10/12/2020   Procedure: POLYPECTOMY;  Surgeon: Cindie Carlin POUR, DO;  [facility];  Service: Endoscopy;;   Social History   Occupational History   Occupation: Hair Dresser  Tobacco Use   Smoking status: Never   Smokeless tobacco: Never  Vaping Use   Vaping status: Never Used  Substance and Sexual Activity   Alcohol use: No   Drug use: No   Sexual  activity: Yes    Birth control/protection: Surgical

## 2023-11-03 ENCOUNTER — Encounter: Payer: Self-pay | Admitting: Rehabilitative and Restorative Service Providers"

## 2023-11-03 ENCOUNTER — Ambulatory Visit: Payer: No Typology Code available for payment source | Admitting: Rehabilitative and Restorative Service Providers"

## 2023-11-03 DIAGNOSIS — R278 Other lack of coordination: Secondary | ICD-10-CM | POA: Diagnosis not present

## 2023-11-03 DIAGNOSIS — M6281 Muscle weakness (generalized): Secondary | ICD-10-CM | POA: Diagnosis not present

## 2023-11-03 DIAGNOSIS — M25542 Pain in joints of left hand: Secondary | ICD-10-CM

## 2023-11-03 DIAGNOSIS — R600 Localized edema: Secondary | ICD-10-CM

## 2023-11-06 NOTE — Therapy (Signed)
 OUTPATIENT OCCUPATIONAL THERAPY TREATMENT NOTE  Patient Name: Megan Allen MRN: [id number] DOB:15-Feb-1956, 68 y.o., female Today's Date: 11/07/2023  PCP: Delbert Clam, MD REFERRING PROVIDER: Persons, Ronal Dragon, [place]   END OF SESSION:  OT End of Session - 11/07/23 1130     Visit Number 2    Number of Visits 6    Date for OT Re-Evaluation 12/08/23    Authorization Type [employer]    OT Start Time 1132    OT Stop Time 1208    OT Time Calculation (min) 36 min    Activity Tolerance Patient tolerated treatment well;No increased pain;Patient limited by fatigue;Patient limited by pain    Behavior During Therapy [facility] for tasks assessed/performed              Past Medical History:  Diagnosis Date   Atypical chest pain 05/06/2021   Chronic back pain    Headache    Moderate mitral regurgitation 05/06/2021   Murmur    Muscle spasm    Obesity (BMI 30.0-34.9) 05/06/2021   Pure hypercholesterolemia 05/06/2021   Past Surgical History:  Procedure Laterality Date   ABDOMINAL HYSTERECTOMY     partial   BREAST CYST ASPIRATION     COLONOSCOPY WITH PROPOFOL  N/A 10/12/2020   Procedure: COLONOSCOPY WITH PROPOFOL ;  Surgeon: Cindie Carlin POUR, DO;  [facility];  Service: Endoscopy;  Laterality: N/A;  12:45   POLYPECTOMY  10/12/2020   Procedure: POLYPECTOMY;  Surgeon: Cindie Carlin POUR, DO;  [facility];  Service: Endoscopy;;   Patient Active Problem List   Diagnosis Date Noted   Hyperlipidemia 05/10/2022   Moderate mitral regurgitation 05/06/2021   Obesity (BMI 30.0-34.9) 05/06/2021   Pure hypercholesterolemia 05/06/2021   Atypical chest pain 05/06/2021   Insect bite of left thigh 06/25/2020   Chronic migraine 05/29/2018   Chronic nonintractable headache 01/30/2018   Blurry vision 01/30/2018   Back pain 10/13/2016   Pain in joint, shoulder region 09/13/2016   Pain in joint, ankle and foot 09/13/2016    ONSET DATE: Sept 2024 pain  worsened  REFERRING DIAG:  M79.645,G89.29 (ICD-10-CM) - Chronic pain of left thumb    THERAPY DIAG:  Muscle weakness (generalized)  Localized edema  Pain in joint of left hand  Rationale for Evaluation and Treatment: Rehabilitation  PERTINENT HISTORY: She states fall years ago, has tried voltaren  gel, uses thumb spica splint   PRECAUTIONS: None;  RED FLAGS:  None   WEIGHT BEARING RESTRICTIONS: No   SUBJECTIVE:   SUBJECTIVE STATEMENT: She states HEP helps relieve pain and she's not hurting today.       PAIN:  Are you having pain? Not now at rest and only mild since last seen   PATIENT GOALS: Decrease pain in left nondominant hand and thumb  NEXT MD VISIT: As needed   OBJECTIVE: (All objective assessments below are from initial evaluation on: 11/03/23 unless otherwise specified.)   HAND DOMINANCE: Right   ADLs: Overall ADLs: States decreased ability to grab, hold household objects, pain and difficulty to open containers, perform FMS tasks (manipulate fasteners on clothing), mild to moderate bathing problems as well.   FUNCTIONAL OUTCOME MEASURES: Eval: Quick DASH 30% impairment today  (Higher % Score  =  More Impairment)    UPPER EXTREMITY ROM     Shoulder to Wrist AROM Right eval Left eval  Forearm supination    Forearm pronation     Wrist flexion 71 75  Wrist extension 78 71  Functional  dart thrower's motion (F-DTM) in ulnar flexion    F-DTM in radial extension     (Blank rows = not tested)   Hand AROM Left eval  Full Fist Ability (or Gap to Distal Palmar Crease) full  Thumb Opposition  (Kapandji Scale)  10/10  (Blank rows = not tested)   UPPER EXTREMITY MMT:     MMT Left 11/03/23  Thumb flexion 5/5 MMT  Thumb extension Painful 4-/5 MMT  Forearm supination   Forearm pronation   Wrist flexion   Wrist extension   Wrist ulnar deviation   Wrist radial deviation   (Blank rows = not tested)  HAND FUNCTION: Eval: Observed weakness in affected  Lt hand and mild pain when compared to the right hand Grip strength Right: 42 lbs, Left: 31 lbs   COORDINATION: Eval: No significant observed coordination impairments with affected Lt hand.  Details will be tested if needed 9 Hole Peg Test Right: TBD sec, Left: TBD sec (TBD sec is [facility])   OBSERVATIONS:   Eval: She has a positive grind test, positive pain with resisted thumb extension.  Tender around the [facility] joint of the left thumb, and nontender in the first dorsal compartment.  STT joint not overly tender, and x-rays seem to show small bone spur and CMC joint   (Lt thumb CMC J OA)   TODAY'S TREATMENT:  11/07/23: OT places her left hand on moist heat for 5 minutes while reviewing her home exercise program with her.  She states that heat feels good and loosens up her soft tissues.  Next, exercises are performed with her and she shows OT how she has been doing them since last visit.  She is doing these very well and OT provides slight modifications including biasing her wrist stretches away from the thumb slightly.  After grip strengthening on a towel, new thumb dynamic stabilization exercises were introduced of first dorsal interossei strength and also thenar eminence strength as bolded below.  These things did not bother her and she feels no pain while doing them.  OT also does some manual therapy, applying Kinesiotape around the base of the thumb with education for doing this for support.  She states this does feel supportive and stabilizing the thumb.  At the end of the session she feels better than when she started today.    Exercises - Seated Wrist Flexion Stretch  - 3 x daily - 3 reps - 15 hold - Wrist Prayer Stretch  - 3 x daily - 3 reps - 15 sec hold - Stretch Thumb DOWNWARD  - 3 x daily - 3 reps - 15 sec hold - Thumb Webspace Stretch  - 3 x daily - 3 reps - 15 sec hold - Towel Roll Grip with Forearm in Neutral  - 3 x daily - 5 reps - 10 sec hold - Spread Index Finger Apart  - 3 x daily - 5  reps - 5 sec hold - C-Strength (try using rubber band)   - 3 x daily - 5 reps - 5 sec hold   PATIENT EDUCATION: Education details: See tx section above for details  Person educated: Patient Education method: Verbal Instruction, Teach back, Handouts  Education comprehension: States and demonstrates understanding, Additional Education required    HOME EXERCISE PROGRAM: Access Code: [email or web address] URL: [email or web address] Date: 11/03/2023 Prepared by: Melvenia Ada   GOALS: Goals reviewed with patient? Yes   SHORT TERM GOALS: (STG required if POC>30 days) Target Date: 11/17/23  Pt will obtain  protective, custom orthotic. Goal status: TBD/PRN  2.  Pt will demo/state understanding of initial HEP to improve pain levels and prerequisite motion. Goal status: 11/07/2023: Met   LONG TERM GOALS: Target Date: 12/08/23  Pt will improve functional ability by decreased impairment per Quick DASH assessment from 30% to 15% or better, for better quality of life. Goal status: INITIAL  2.  Pt will improve grip strength in Lt hand from tender 31 pounds to at least 40 pounds for functional use at home and in IADLs. Goal status: INITIAL  3.  Pt will improve A/ROM in Lt Wrist extension from 71 degrees to at least 75 degrees, to have functional motion for tasks like reach and grasp.  Goal status: INITIAL  4.  Pt will improve strength in Lt Thumb extension from painful 4 -/5 MMT to at least 4/5 nontender MMT to have increased functional ability to carry out selfcare and higher-level homecare tasks with less difficulty. Goal status: INITIAL  5.  Pt will decrease pain at worst from 5-7/10 to 3/10 or better to have better sleep and occupational participation in daily roles. Goal status: INITIAL  ASSESSMENT:  CLINICAL IMPRESSION: 11/07/23: She has no pain today and less pain since last seen, she seems to be getting her home exercises really well, and she is thinking about purchasing a  neoprene thumb brace.  Kinesiotape seem to help be supportive today.  If she does this well next week we could consider early discharge.   PLAN:  OT FREQUENCY: 1-2x/week  OT DURATION: 5 weeks through 12/08/2023 and up to 10 total visits as needed  PLANNED INTERVENTIONS: 97168 OT Re-evaluation, 97535 self care/ADL training, 02889 therapeutic exercise, 97530 therapeutic activity, 97112 neuromuscular re-education, 97140 manual therapy, 97035 ultrasound, 97039 fluidotherapy, 97010 moist heat, 97010 cryotherapy, 97034 contrast bath, 97760 Orthotics management and training, 02239 Splinting (initial encounter), [id number] Subsequent splinting/medication, compression bandaging, Dry needling, energy conservation, coping strategies training, patient/family education, and DME and/or AE instructions  CONSULTED AND AGREED WITH PLAN OF CARE: Patient  PLAN FOR NEXT SESSION:   Provide handout for OA tips check HEP, consider discharge is all goals are met    Melvenia Ada, OTR/L, CHT 11/07/2023, 12:14 PM

## 2023-11-07 ENCOUNTER — Encounter: Payer: Self-pay | Admitting: Rehabilitative and Restorative Service Providers"

## 2023-11-07 ENCOUNTER — Ambulatory Visit: Payer: No Typology Code available for payment source | Admitting: Rehabilitative and Restorative Service Providers"

## 2023-11-07 DIAGNOSIS — M6281 Muscle weakness (generalized): Secondary | ICD-10-CM | POA: Diagnosis not present

## 2023-11-07 DIAGNOSIS — M25542 Pain in joints of left hand: Secondary | ICD-10-CM | POA: Diagnosis not present

## 2023-11-07 DIAGNOSIS — R600 Localized edema: Secondary | ICD-10-CM | POA: Diagnosis not present

## 2023-11-13 NOTE — Therapy (Signed)
OUTPATIENT OCCUPATIONAL THERAPY TREATMENT NOTE  Patient Name: Megan Allen MRN: [id number] DOB:12/09/1955, 68 y.o., female Today's Date: 11/14/2023  PCP: Hoy Register, MD REFERRING PROVIDER: Persons, [place], [place]   END OF SESSION:  OT End of Session - 11/14/23 1146     Visit Number 3    Number of Visits 6    Date for OT Re-Evaluation 12/08/23    Authorization Type [employer]    OT Start Time 1146    OT Stop Time 1226    OT Time Calculation (min) 40 min    Equipment Utilized During Treatment orthotic materials    Activity Tolerance Patient tolerated treatment well;No increased pain;Patient limited by fatigue;Patient limited by pain    Behavior During Therapy [facility] for tasks assessed/performed               Past Medical History:  Diagnosis Date   Atypical chest pain 05/06/2021   Chronic back pain    Headache    Moderate mitral regurgitation 05/06/2021   Murmur    Muscle spasm    Obesity (BMI 30.0-34.9) 05/06/2021   Pure hypercholesterolemia 05/06/2021   Past Surgical History:  Procedure Laterality Date   ABDOMINAL HYSTERECTOMY     partial   BREAST CYST ASPIRATION     COLONOSCOPY WITH PROPOFOL N/A 10/12/2020   Procedure: COLONOSCOPY WITH PROPOFOL;  Surgeon: Lanelle Bal, DO;  [facility];  Service: Endoscopy;  Laterality: N/A;  12:45   POLYPECTOMY  10/12/2020   Procedure: POLYPECTOMY;  Surgeon: Lanelle Bal, DO;  [facility];  Service: Endoscopy;;   Patient Active Problem List   Diagnosis Date Noted   Hyperlipidemia 05/10/2022   Moderate mitral regurgitation 05/06/2021   Obesity (BMI 30.0-34.9) 05/06/2021   Pure hypercholesterolemia 05/06/2021   Atypical chest pain 05/06/2021   Insect bite of left thigh 06/25/2020   Chronic migraine 05/29/2018   Chronic nonintractable headache 01/30/2018   Blurry vision 01/30/2018   Back pain 10/13/2016   Pain in joint, shoulder region 09/13/2016   Pain in joint, ankle and foot  09/13/2016    ONSET DATE: Sept 2024 pain worsened  REFERRING DIAG:  M79.645,G89.29 (ICD-10-CM) - Chronic pain of left thumb    THERAPY DIAG:  Muscle weakness (generalized)  Pain in joint of left hand  Localized edema  Rationale for Evaluation and Treatment: Rehabilitation  PERTINENT HISTORY: She states fall years ago, has tried voltaren gel, uses thumb spica splint   PRECAUTIONS: None;  RED FLAGS:  None   WEIGHT BEARING RESTRICTIONS: No   SUBJECTIVE:   SUBJECTIVE STATEMENT: She states wearing stiff wrist cock-up/thumb spica and getting irritated / pain from it.    PAIN:  Are you having pain? Yes Lt 2/10 pain irritated from wearing a brace    PATIENT GOALS: Decrease pain in left nondominant hand and thumb  NEXT MD VISIT: As needed   OBJECTIVE: (All objective assessments below are from initial evaluation on: 11/03/23 unless otherwise specified.)   HAND DOMINANCE: Right   ADLs: Overall ADLs: States decreased ability to grab, hold household objects, pain and difficulty to open containers, perform FMS tasks (manipulate fasteners on clothing), mild to moderate bathing problems as well.   FUNCTIONAL OUTCOME MEASURES: Eval: Quick DASH 30% impairment today  (Higher % Score  =  More Impairment)    UPPER EXTREMITY ROM     Shoulder to Wrist AROM Right eval Left eval Lt 11/14/23  Forearm supination     Forearm pronation  Wrist flexion 71 75 75  Wrist extension 78 71 70  Functional dart thrower's motion (F-DTM) in ulnar flexion     F-DTM in radial extension      (Blank rows = not tested)   Hand AROM Left eval  Full Fist Ability (or Gap to Distal Palmar Crease) full  Thumb Opposition  (Kapandji Scale)  10/10  (Blank rows = not tested)   UPPER EXTREMITY MMT:     MMT Left 11/03/23 Lt 11/14/23  Thumb flexion 5/5 MMT   Thumb extension Painful 4-/5 MMT 4+/5  Forearm supination    Forearm pronation    Wrist flexion    Wrist extension    Wrist ulnar  deviation    Wrist radial deviation    (Blank rows = not tested)  HAND FUNCTION: 11/14/23: Grip Lt: 23#  tender   Eval: Observed weakness in affected Lt hand and mild pain when compared to the right hand Grip strength Right: 42 lbs, Left: 31 lbs   COORDINATION: 11/14/23: 9HPT: LT 21sec (22sec is WFL)   Eval: No significant observed coordination impairments with affected Lt hand.  Details will be tested if needed   OBSERVATIONS:   Eval: She has a positive grind test, positive pain with resisted thumb extension.  Tender around the [facility] joint of the left thumb, and nontender in the first dorsal compartment.  STT joint not overly tender, and x-rays seem to show small bone spur and CMC joint   (Lt thumb CMC J OA)   TODAY'S TREATMENT:  11/14/23: She starts with active range of motion which shows maintained good motion at the wrist and thumb, though she does have a bit of pain and soreness today from overuse of her prefabricated thumb spica brace that does not fit well.  She states that she feels like she needs it for driving.  OT decides to make her a custom fabricated hand-based thumb spica orthosis today that will fit well, not cause pain or irritation and help support her when she drives.  OT also gives out a handout with education for preventing pain and modifying routines and habits for daily abilities.  This includes using "slider" Ziploc's, using scissors and tools as needed, prevention of lateral pinching and instead using 3 jaw chuck pinching.  She states understanding these things.  We also review her home exercises and decided to continue on therapy another visit as she is tender and sore today and does not do as well with grip strength.    11/07/23: OT places her left hand on moist heat for 5 minutes while reviewing her home exercise program with her.  She states that heat feels good and loosens up her soft tissues.  Next, exercises are performed with her and she shows OT how she has been  doing them since last visit.  She is doing these very well and OT provides slight modifications including biasing her wrist stretches away from the thumb slightly.  After grip strengthening on a towel, new thumb dynamic stabilization exercises were introduced of first dorsal interossei strength and also thenar eminence strength as bolded below.  These things did not bother her and she feels no pain while doing them.  OT also does some manual therapy, applying Kinesiotape around the base of the thumb with education for doing this for support.  She states this does feel supportive and stabilizing the thumb.  At the end of the session she feels better than when she started today.    Exercises -  Seated Wrist Flexion Stretch  - 3 x daily - 3 reps - 15 hold - Wrist Prayer Stretch  - 3 x daily - 3 reps - 15 sec hold - Stretch Thumb DOWNWARD  - 3 x daily - 3 reps - 15 sec hold - Thumb Webspace Stretch  - 3 x daily - 3 reps - 15 sec hold - Towel Roll Grip with Forearm in Neutral  - 3 x daily - 5 reps - 10 sec hold - Spread Index Finger Apart  - 3 x daily - 5 reps - 5 sec hold - C-Strength (try using rubber band)   - 3 x daily - 5 reps - 5 sec hold   PATIENT EDUCATION: Education details: See tx section above for details  Person educated: Patient Education method: Verbal Instruction, Teach back, Handouts  Education comprehension: States and demonstrates understanding, Additional Education required    HOME EXERCISE PROGRAM: Access Code: [email or web address] URL: [email or web address] Date: 11/03/2023 Prepared by: Fannie Knee   GOALS: Goals reviewed with patient? Yes   SHORT TERM GOALS: (STG required if POC>30 days) Target Date: 11/17/23  Pt will obtain protective, custom orthotic. Goal status: TBD/PRN  2.  Pt will demo/state understanding of initial HEP to improve pain levels and prerequisite motion. Goal status: 11/07/2023: Met   LONG TERM GOALS: Target Date: 12/08/23  Pt will  improve functional ability by decreased impairment per Quick DASH assessment from 30% to 15% or better, for better quality of life. Goal status: INITIAL  2.  Pt will improve grip strength in Lt hand from tender 31 pounds to at least 40 pounds for functional use at home and in IADLs. Goal status: INITIAL  3.  Pt will improve A/ROM in Lt Wrist extension from 71 degrees to at least 75 degrees, to have functional motion for tasks like reach and grasp.  Goal status: INITIAL  4.  Pt will improve strength in Lt Thumb extension from painful 4 -/5 MMT to at least 4/5 nontender MMT to have increased functional ability to carry out selfcare and higher-level homecare tasks with less difficulty. Goal status: INITIAL  5.  Pt will decrease pain at worst from 5-7/10 to 3/10 or better to have better sleep and occupational participation in daily roles. Goal status: INITIAL  ASSESSMENT:  CLINICAL IMPRESSION: 11/14/23: She still felt reliant on using her prefabricated wrist cock up and actually was hurting her.  OT decided to make a custom rigid orthosis for her for when she feels like she needs more support.  This should work better and not hurt her.  She will continue her home exercise program and try to self manage for 2 weeks and then return again for possible discharge if all is well  11/07/23: She has no pain today and less pain since last seen, she seems to be getting her home exercises really well, and she is thinking about purchasing a neoprene thumb brace.  Kinesiotape seem to help be supportive today.  If she does this well next week we could consider early discharge.   PLAN:  OT FREQUENCY: 1-2x/week  OT DURATION: 5 weeks through 12/08/2023 and up to 10 total visits as needed  PLANNED INTERVENTIONS: 97168 OT Re-evaluation, 97535 self care/ADL training, 56433 therapeutic exercise, 97530 therapeutic activity, 97112 neuromuscular re-education, 97140 manual therapy, 97035 ultrasound, 97039  fluidotherapy, 97010 moist heat, 97010 cryotherapy, 97034 contrast bath, 97760 Orthotics management and training, 29518 Splinting (initial encounter), [id number] Subsequent splinting/medication, compression bandaging, Dry needling, energy conservation, coping strategies training,  patient/family education, and DME and/or AE instructions  CONSULTED AND AGREED WITH PLAN OF CARE: Patient  PLAN FOR NEXT SESSION:   Check new orthosis, check all goals and again consider discharge if goals are met and patient is satisfied   Fannie Knee, OTR/L, CHT 11/14/2023, 12:39 PM

## 2023-11-14 ENCOUNTER — Ambulatory Visit (INDEPENDENT_AMBULATORY_CARE_PROVIDER_SITE_OTHER): Payer: No Typology Code available for payment source | Admitting: Rehabilitative and Restorative Service Providers"

## 2023-11-14 ENCOUNTER — Encounter: Payer: Self-pay | Admitting: Rehabilitative and Restorative Service Providers"

## 2023-11-14 DIAGNOSIS — M6281 Muscle weakness (generalized): Secondary | ICD-10-CM

## 2023-11-14 DIAGNOSIS — R600 Localized edema: Secondary | ICD-10-CM

## 2023-11-14 DIAGNOSIS — M25542 Pain in joints of left hand: Secondary | ICD-10-CM

## 2023-11-21 ENCOUNTER — Encounter: Payer: No Typology Code available for payment source | Admitting: Rehabilitative and Restorative Service Providers"

## 2023-11-28 ENCOUNTER — Encounter: Payer: No Typology Code available for payment source | Admitting: Rehabilitative and Restorative Service Providers"

## 2023-11-29 ENCOUNTER — Ambulatory Visit
Admission: RE | Admit: 2023-11-29 | Discharge: 2023-11-29 | Disposition: A | Discharge status: Home / Self Care | Payer: No Typology Code available for payment source | Source: Ambulatory Visit | Attending: Family Medicine | Admitting: Family Medicine

## 2023-11-29 DIAGNOSIS — E2839 Other primary ovarian failure: Secondary | ICD-10-CM

## 2023-11-29 DIAGNOSIS — N958 Other specified menopausal and perimenopausal disorders: Secondary | ICD-10-CM | POA: Diagnosis not present

## 2023-11-29 DIAGNOSIS — M8588 Other specified disorders of bone density and structure, other site: Secondary | ICD-10-CM | POA: Diagnosis not present

## 2023-11-30 ENCOUNTER — Encounter: Payer: Self-pay | Admitting: Family Medicine

## 2023-12-01 ENCOUNTER — Other Ambulatory Visit: Payer: Self-pay | Admitting: Cardiology

## 2023-12-01 ENCOUNTER — Other Ambulatory Visit: Payer: Self-pay | Admitting: Family Medicine

## 2023-12-01 LAB — POCT ABI - SCREENING FOR PILOT NO CHARGE
Left ABI: 1.17
Right ABI: 1.14

## 2023-12-01 LAB — GLUCOSE, POCT (MANUAL RESULT ENTRY): Glucose Fasting, POC: 5.7 mg/dL — AB (ref 70–99)

## 2023-12-01 NOTE — Telephone Encounter (Signed)
 Requested by interface surescripts.  Requested Prescriptions  Pending Prescriptions Disp Refills   atorvastatin  (LIPITOR) 10 MG tablet [Pharmacy Med Name: ATORVASTATIN  10 MG TABLET] 90 tablet 1    Sig: TAKE 1 TABLET BY MOUTH EVERY DAY     Cardiovascular:  Antilipid - Statins Failed - 12/01/2023  4:29 PM      Failed - Lipid Panel in normal range within the last 12 months    Cholesterol, Total  Date Value Ref Range Status  05/29/2023 143 100 - 199 mg/dL Final   LDL Chol Calc (NIH)  Date Value Ref Range Status  05/29/2023 67 0 - 99 mg/dL Final   HDL  Date Value Ref Range Status  05/29/2023 67 >39 mg/dL Final   Triglycerides  Date Value Ref Range Status  05/29/2023 35 0 - 149 mg/dL Final         Passed - Patient is not pregnant      Passed - Valid encounter within last 12 months    Recent Outpatient Visits           1 month ago Need for immunization against influenza   [facility] [place]. [facility] [place], [place], [place]   6 months ago Other hyperlipidemia   [facility] [place]. [facility] Delbert Clam, [place]   1 year ago Other hyperlipidemia   [facility] [place]. [facility] [place], [place], [place]   1 year ago Chronic bilateral low back pain with right-sided sciatica   [facility] [place]. [facility] Delbert Clam, MD   2 years ago Annual physical exam   [facility] [place]. [facility] Delbert Clam, MD

## 2023-12-01 NOTE — Progress Notes (Signed)
 Patient attended Heart Event ABI Normal, Blood pressure  WNL, A1C 5.7. Patient had no SDOH needs

## 2023-12-01 NOTE — Telephone Encounter (Signed)
 Melissa from [facility] states they faxed a form for Dr Newlinn to sign on 10/06/2023 and have never received it back.  It is for a brace Dr Newlin had her go there for.   Melissa is going to re fax now.  Please advise.   Melissa would appreciate a call back if you do not receive.   Cb  [phone]

## 2023-12-02 LAB — LIPID PANEL W/O CHOL/HDL RATIO
Cholesterol, Total: 154 mg/dL (ref 100–199)
HDL: 62 mg/dL (ref >39)
LDL Chol Calc (NIH): 82 mg/dL (ref 0–99)
Triglycerides: 45 mg/dL (ref 0–149)
VLDL Cholesterol Cal: 10 mg/dL (ref 5–40)

## 2023-12-05 ENCOUNTER — Encounter: Payer: No Typology Code available for payment source | Admitting: Rehabilitative and Restorative Service Providers"

## 2023-12-14 NOTE — Therapy (Signed)
OUTPATIENT OCCUPATIONAL THERAPY TREATMENT & discharge NOTE  Patient Name: Megan Allen MRN: [id number] DOB:[birth year], 68 y.o., female Today's Date: 12/15/2023  PCP: Hoy Register, MD REFERRING PROVIDER: Persons, [place], [place]           OCCUPATIONAL THERAPY DISCHARGE SUMMARY  Visits from Start of Care: 4  Current functional level related to goals / functional outcomes: 12/15/23: She has now met most of her long-term goals and is doing much better in the start of care, despite not being seen for about a month due to illness and weather issues.  She has self managed her pain and problems very well, and agrees to maximizing therapy at this point and ready for discharge.  Rates pain and impairment down from 30% to 18%.  Education / Equipment: Pt has all needed materials and education. Pt understands how to continue on with self-management. See tx notes for more details.   Patient agrees to discharge due to max benefits received from outpatient occupational therapy / hand therapy at this time.   PLAN:  OT FREQUENCY: 1 last visit  OT DURATION: 1 additional week (12/08/2023 - 12/15/23) and 1 additional visit to cover today's last visit   Fannie Knee, OTR/L, CHT 12/15/23         END OF SESSION:  OT End of Session - 12/15/23 1059     Visit Number 4    Number of Visits 6    Date for OT Re-Evaluation 12/08/23    Authorization Type [employer]    OT Start Time 1059    OT Stop Time 1133    OT Time Calculation (min) 34 min    Equipment Utilized During Treatment --    Activity Tolerance Patient tolerated treatment well;No increased pain;Patient limited by fatigue;Patient limited by pain    Behavior During Therapy [facility] for tasks assessed/performed             Past Medical History:  Diagnosis Date   Atypical chest pain 05/06/2021   Chronic back pain    Headache    Moderate mitral regurgitation 05/06/2021   Murmur    Muscle spasm    Obesity (BMI  30.0-34.9) 05/06/2021   Pure hypercholesterolemia 05/06/2021   Past Surgical History:  Procedure Laterality Date   ABDOMINAL HYSTERECTOMY     partial   BREAST CYST ASPIRATION     COLONOSCOPY WITH PROPOFOL N/A 10/12/2020   Procedure: COLONOSCOPY WITH PROPOFOL;  Surgeon: Lanelle Bal, DO;  [facility];  Service: Endoscopy;  Laterality: N/A;  12:45   POLYPECTOMY  10/12/2020   Procedure: POLYPECTOMY;  Surgeon: Lanelle Bal, DO;  [facility];  Service: Endoscopy;;   Patient Active Problem List   Diagnosis Date Noted   Hyperlipidemia 05/10/2022   Moderate mitral regurgitation 05/06/2021   Obesity (BMI 30.0-34.9) 05/06/2021   Pure hypercholesterolemia 05/06/2021   Atypical chest pain 05/06/2021   Insect bite of left thigh 06/25/2020   Chronic migraine 05/29/2018   Chronic nonintractable headache 01/30/2018   Blurry vision 01/30/2018   Back pain 10/13/2016   Pain in joint, shoulder region 09/13/2016   Pain in joint, ankle and foot 09/13/2016    ONSET DATE: Sept 2024 pain worsened  REFERRING DIAG:  M79.645,G89.29 (ICD-10-CM) - Chronic pain of left thumb    THERAPY DIAG:  Muscle weakness (generalized)  Pain in joint of left hand  Localized edema  Rationale for Evaluation and Treatment: Rehabilitation  PERTINENT HISTORY: She states fall years ago, has tried voltaren  gel, uses thumb spica splint   PRECAUTIONS: None;  RED FLAGS:  None   WEIGHT BEARING RESTRICTIONS: No   SUBJECTIVE:   SUBJECTIVE STATEMENT: She arrives after not being seen for a month due to scheduling issues, bad weather, illnesses, etc.  Today she states having some mild pain, but generally better than before.  She also states her functional ability is improving.  She states continuing to do her home exercises.     PAIN:  Are you having pain? Yes Lt  5-6/10 pain at rest now   PATIENT GOALS: Decrease pain in left nondominant hand and thumb  NEXT MD VISIT: As  needed   OBJECTIVE: (All objective assessments below are from initial evaluation on: 11/03/23 unless otherwise specified.)   HAND DOMINANCE: Right   ADLs: Overall ADLs: States decreased ability to grab, hold household objects, pain and difficulty to open containers, perform FMS tasks (manipulate fasteners on clothing), mild to moderate bathing problems as well.   FUNCTIONAL OUTCOME MEASURES: 12/15/23: Quick DASH: 18%   Eval: Quick DASH 30% impairment today  (Higher % Score  =  More Impairment)    UPPER EXTREMITY ROM     Shoulder to Wrist AROM Right eval Left eval Lt 11/14/23 Lt 12/15/23  Forearm supination      Forearm pronation       Wrist flexion 71 75 75 75  Wrist extension 78 71 70 76  Functional dart thrower's motion (F-DTM) in ulnar flexion      F-DTM in radial extension       (Blank rows = not tested)   Hand AROM Left eval  Full Fist Ability (or Gap to Distal Palmar Crease) full  Thumb Opposition  (Kapandji Scale)  10/10  (Blank rows = not tested)   UPPER EXTREMITY MMT:     MMT Left 11/03/23 Lt 11/14/23 Lt 12/15/23  Thumb flexion 5/5 MMT  5/5  Thumb extension Painful 4-/5 MMT 4+/5 5/5  Forearm supination     Forearm pronation     Wrist flexion     Wrist extension     Wrist ulnar deviation     Wrist radial deviation     (Blank rows = not tested)  HAND FUNCTION: 12/15/23: Grip Lt: 36#   11/14/23: Grip Lt: 23#  tender   Eval: Observed weakness in affected Lt hand and mild pain when compared to the right hand Grip strength Right: 42 lbs, Left: 31 lbs   COORDINATION: 11/14/23: 9HPT: LT 21sec (22sec is WFL)   Eval: No significant observed coordination impairments with affected Lt hand.  Details will be tested if needed   TODAY'S TREATMENT:  12/15/23: Pt performs AROM, gripping, and strength with Lt hand/thumb against therapist's resistance for exercise/activities as well as new measures today. OT also discusses home and functional tasks with the pt and  reviews goals.  She states doing better with functional activities and with pain in general.  Using the complied data, OT also reviews home exercises and provides final recommendations to continue home exercise program 2-3 times a day as needed, starting the day with a 5-minute warm soak and a stretch gentle routine.  She states understanding this and doing well with that at home herself.  She also has several different bracing options including a custom orthosis which was adjusted and had padding added today to improve its comfort.  She states that it is quite comfortable after this.  She states understanding all self-management strategies and techniques, and that she is being "  smarter" now.  She agrees to discharge today as most of her goals are met and she has maximized the benefit from outpatient occupational therapy at this time.  PATIENT EDUCATION: Education details: See tx section above for details  Person educated: Patient Education method: Verbal Instruction, Teach back, Handouts  Education comprehension: States and demonstrates understanding   HOME EXERCISE PROGRAM: Access Code: [email or web address] URL: [email or web address] Date: 11/03/2023 Prepared by: Fannie Knee   GOALS: Goals reviewed with patient? Yes   SHORT TERM GOALS: (STG required if POC>30 days) Target Date: 11/17/23  Pt will obtain protective, custom orthotic. Goal status: 11/14/23: MET  2.  Pt will demo/state understanding of initial HEP to improve pain levels and prerequisite motion. Goal status: 11/07/2023: Met   LONG TERM GOALS: Target Date: 12/08/23  Pt will improve functional ability by decreased impairment per Quick DASH assessment from 30% to 15% or better, for better quality of life. Goal status: 12/15/23: 18% today, considered MET  2.  Pt will improve grip strength in Lt hand from tender 31 pounds to at least 40 pounds for functional use at home and in IADLs. Goal status: 12/15/23: now 36#, not  met but much better  3.  Pt will improve A/ROM in Lt Wrist extension from 71 degrees to at least 75 degrees, to have functional motion for tasks like reach and grasp.  Goal status: 12/15/23: 75* MET  4.  Pt will improve strength in Lt Thumb extension from painful 4 -/5 MMT to at least 4/5 nontender MMT to have increased functional ability to carry out selfcare and higher-level homecare tasks with less difficulty. Goal status: 12/15/23: MET  5.  Pt will decrease pain at worst from 5-7/10 to 3/10 or better to have better sleep and occupational participation in daily roles. Goal status: 12/15/23: not met, still higher pain at times, when cold, but generally states less pain now  ASSESSMENT:  CLINICAL IMPRESSION: 12/15/23: She has now met most of her long-term goals and is doing much better in the start of care, despite not being seen for about a month due to illness and weather issues.  She has self managed her pain and problems very well, and agrees to maximizing therapy at this point and ready for discharge.  Rates pain and impairment down from 30% to 18%.    PLAN:  OT FREQUENCY: 1 last visit  OT DURATION: 1 additional week (12/08/2023 - 12/15/23) and 1 additional visit to cover today's last visit   PLANNED INTERVENTIONS: 97168 OT Re-evaluation, 97535 self care/ADL training, 54098 therapeutic exercise, 97530 therapeutic activity, 97112 neuromuscular re-education, 97140 manual therapy, 97035 ultrasound, 97039 fluidotherapy, 97010 moist heat, 97010 cryotherapy, 97034 contrast bath, 97760 Orthotics management and training, 11914 Splinting (initial encounter), [id number] Subsequent splinting/medication, compression bandaging, Dry needling, energy conservation, coping strategies training, patient/family education, and DME and/or AE instructions  CONSULTED AND AGREED WITH PLAN OF CARE: Patient  PLAN FOR NEXT SESSION:   N/A/discharge   Fannie Knee, OTR/L, CHT 12/15/2023, 11:42 AM

## 2023-12-15 ENCOUNTER — Encounter: Payer: Self-pay | Admitting: Rehabilitative and Restorative Service Providers"

## 2023-12-15 ENCOUNTER — Ambulatory Visit: Payer: No Typology Code available for payment source | Admitting: Rehabilitative and Restorative Service Providers"

## 2023-12-15 DIAGNOSIS — R600 Localized edema: Secondary | ICD-10-CM

## 2023-12-15 DIAGNOSIS — M25542 Pain in joints of left hand: Secondary | ICD-10-CM

## 2023-12-15 DIAGNOSIS — M6281 Muscle weakness (generalized): Secondary | ICD-10-CM

## 2023-12-29 ENCOUNTER — Telehealth (INDEPENDENT_AMBULATORY_CARE_PROVIDER_SITE_OTHER): Payer: Self-pay | Admitting: Otolaryngology

## 2023-12-29 NOTE — Telephone Encounter (Signed)
 Confirmed appt & location [id number] afm

## 2024-01-01 ENCOUNTER — Ambulatory Visit (INDEPENDENT_AMBULATORY_CARE_PROVIDER_SITE_OTHER): Payer: No Typology Code available for payment source

## 2024-01-01 ENCOUNTER — Encounter (INDEPENDENT_AMBULATORY_CARE_PROVIDER_SITE_OTHER): Payer: Self-pay

## 2024-01-01 ENCOUNTER — Ambulatory Visit (INDEPENDENT_AMBULATORY_CARE_PROVIDER_SITE_OTHER): Payer: No Typology Code available for payment source | Admitting: Audiology

## 2024-01-01 VITALS — BP 116/74 | HR 65 | Ht 65.5 in | Wt 160.0 lb

## 2024-01-01 DIAGNOSIS — H6093 Unspecified otitis externa, bilateral: Secondary | ICD-10-CM

## 2024-01-01 DIAGNOSIS — H608X3 Other otitis externa, bilateral: Secondary | ICD-10-CM

## 2024-01-01 DIAGNOSIS — Z0110 Encounter for examination of ears and hearing without abnormal findings: Secondary | ICD-10-CM | POA: Diagnosis not present

## 2024-01-01 DIAGNOSIS — H903 Sensorineural hearing loss, bilateral: Secondary | ICD-10-CM | POA: Diagnosis not present

## 2024-01-01 DIAGNOSIS — H93291 Other abnormal auditory perceptions, right ear: Secondary | ICD-10-CM

## 2024-01-01 MED ORDER — MOMETASONE FUROATE 0.1 % EX CREA: TOPICAL_CREAM | CUTANEOUS | 3 refills | Status: AC

## 2024-01-01 NOTE — Progress Notes (Signed)
  [address], [address] [place], [place] [address] [phone]  Audiological Evaluation    Name: Megan Allen     DOB:   05/21/1956      MRN:   [id number]                                                                                     Service Date: 01/01/2024     Accompanied by: unaccompanied    Patient comes today after Dr. Suszanne Conners, ENT sent a referral for a hearing evaluation due to concerns with hearing loss.   Symptoms Yes Details  Hearing loss  [x]  others say that she doe snot hear well  Tinnitus  [x]  Rarely and only lasts a few seconds  Ear pain/ infections/pressure  []    Balance problems  [x]  Off balance sometimes  Noise exposure history  []    Previous ear surgeries  []    Family history of hearing loss  []    Amplification  []    Other  []      Otoscopy: Right ear: Clear external ear canals and notable landmarks visualized on the tympanic membrane. Left ear:  Clear external ear canals and notable landmarks visualized on the tympanic membrane.  Tympanometry: Right ear: Type Ad- Normal external ear canal volume with normal middle ear pressure and high tympanic membrane compliance Left ear: Type A- Normal external ear canal volume with normal middle ear pressure and tympanic membrane compliance   Pure tone Audiometry: Both ears-Normal to borderline normal hearing from 250 Hz - 8000 Hz.  Speech Audiometry: Right ear- Speech Reception Threshold (SRT) was obtained at 10 dBHL. Left ear-Speech Reception Threshold (SRT) was obtained at 10 dBHL.   Word Recognition Score Tested using NU-6 (MLV) Right ear: 100% was obtained at a presentation level of 55 dBHL with contralateral masking which is deemed as  excellent. Left ear: 100% was obtained at a presentation level of 55 dBHL with contralateral masking which is deemed as  excellent.   The hearing test results were completed under inserts and results are deemed to be of good reliability. Test technique:   conventional      Recommendations: Follow up with ENT as scheduled for today., Return for a hearing evaluation if concerns with hearing changes arise or per MD recommendation.   [name], AUD

## 2024-01-02 DIAGNOSIS — H903 Sensorineural hearing loss, bilateral: Secondary | ICD-10-CM | POA: Insufficient documentation

## 2024-01-02 DIAGNOSIS — H608X3 Other otitis externa, bilateral: Secondary | ICD-10-CM | POA: Insufficient documentation

## 2024-01-02 NOTE — Progress Notes (Signed)
 Patient ID: Megan Allen, female   DOB: [birth year], 68 y.o.   MRN: [id number]  CC: Hearing loss, itchy ears  HPI:  Megan Allen is a 68 y.o. female who presents today complaining of hearing loss and itchy sensation in her ears.  According to the patient, she has been having hearing difficulty in noisy environments.  She denies any otalgia, otorrhea, or vertigo.  She has no previous otitis media or otitis externa.  She reports frequent itchy sensation in her ears.  She has no previous otologic surgery.  She has never worn hearing aids.  Past Medical History:  Diagnosis Date   Atypical chest pain 05/06/2021   Chronic back pain    Headache    Moderate mitral regurgitation 05/06/2021   Murmur    Muscle spasm    Obesity (BMI 30.0-34.9) 05/06/2021   Pure hypercholesterolemia 05/06/2021    Past Surgical History:  Procedure Laterality Date   ABDOMINAL HYSTERECTOMY     partial   BREAST CYST ASPIRATION     COLONOSCOPY WITH PROPOFOL N/A 10/12/2020   Procedure: COLONOSCOPY WITH PROPOFOL;  Surgeon: Megan Bal, DO;  [facility];  Service: Endoscopy;  Laterality: N/A;  12:45   POLYPECTOMY  10/12/2020   Procedure: POLYPECTOMY;  Surgeon: Megan Bal, DO;  [facility];  Service: Endoscopy;;    Family History  Problem Relation Age of Onset   Hyperlipidemia Mother    Diabetes Father    Diabetes Brother    Heart disease Daughter    Stroke Maternal Grandfather     Social History:  reports that she has never smoked. She has never been exposed to tobacco smoke. She has never used smokeless tobacco. She reports that she does not drink alcohol and does not use drugs.  Allergies:  Allergies  Allergen Reactions   Bee Venom    Caffeine     Chest pain, heart racing    Prior to Admission medications   Medication Sig Start Date End Date Taking? Authorizing Provider  acetaminophen (TYLENOL) 650 MG CR tablet Take 650 mg by mouth as needed for pain.    Yes Provider, Historical, Megan Allen  Ascorbic Acid (VITAMIN C) 1000 MG tablet Take 1,000 mg by mouth daily.   Yes Provider, Historical, Megan Allen  atorvastatin (LIPITOR) 10 MG tablet TAKE 1 TABLET BY MOUTH EVERY DAY 12/01/23  Yes Allen, Enobong, Megan Allen  bisacodyl (DULCOLAX) 5 MG EC tablet Take 5 mg by mouth daily as needed for moderate constipation.   Yes Provider, Historical, Megan Allen  Blood Glucose Monitoring Suppl (ONETOUCH VERIO) w/Device KIT Test blood sugar 3 times daily 06/15/23  Yes Allen, Enobong, Megan Allen  Calcium Carb-Cholecalciferol 600-800 MG-UNIT TABS Take 1 tablet by mouth daily.   Yes Provider, Historical, Megan Allen  Cholecalciferol (VITAMIN D3 PO) Take 2,000 Units by mouth daily.   Yes Provider, Historical, Megan Allen  Cyanocobalamin (VITAMIN B-12 PO) Take 3,000 mcg by mouth daily.   Yes Provider, Historical, Megan Allen  diclofenac Sodium (VOLTAREN) 1 % GEL Apply 4 g topically 2 (two) times daily as needed. 05/29/23  Yes Allen, Megan Horns, Megan Allen  ECHINACEA PO Take 400 mg by mouth as needed.   Yes Provider, Historical, Megan Allen  EPINEPHrine 0.3 mg/0.3 mL IJ SOAJ injection Inject 0.3 mLs (0.3 mg total) into the muscle as needed for anaphylaxis (severe allergic reactions from bee or other insect stings resulting in shortness of breath). 06/25/20  Yes Megan Frisk, Megan Allen  famotidine (PEPCID) 40 MG tablet Take 1 tablet (40 mg  total) by mouth daily. For five days. 06/25/20  Yes Allen, Megan Horns, Megan Allen  fluticasone (FLONASE) 50 MCG/ACT nasal spray Place 2 sprays into both nostrils daily. 08/31/22  Yes Megan Allen Allen, Megan Allen  glucose blood (ONETOUCH VERIO) test strip Use as instructed 06/22/23  Yes Megan Register, Megan Allen  Lancets (ONETOUCH DELICA PLUS LANCET33G) MISC Use to test blood sugar 3 times a day 06/22/23  Yes Allen, Megan Horns, Megan Allen  meloxicam (MOBIC) 7.5 MG tablet Take 1 tablet (7.5 mg total) by mouth daily. 08/31/22  Yes Allen, Megan Schlein, Megan Allen  methocarbamol (ROBAXIN) 750 MG tablet Take 1 tablet (750 mg total) by mouth every 8 (eight) hours as needed for muscle  spasms. 03/10/21  Yes Megan Register, Megan Allen  Misc. Devices MISC Left hand brace. Diagnosis - Lollie Sails tenosynovitis 08/28/23  Yes Megan Register, Megan Allen  mometasone (ELOCON) 0.1 % cream Apply topically daily as needed for itch 01/01/24  Yes Megan Pies, Megan Allen  Multiple Vitamin (MULTIVITAMIN) capsule Take 1 capsule by mouth daily.   Yes Provider, Historical, Megan Allen  UNABLE TO FIND Take 1 tablet by mouth as needed. OTC Calms for simple nervous tension and occasional sleeplessness.   Yes Provider, Historical, Megan Allen    Blood pressure 116/74, pulse 65, height 5' 5.5" (1.664 Allen), weight 160 lb (72.6 kg), SpO2 97%. Exam: General: Communicates without difficulty, well nourished, no acute distress. Head: Normocephalic, no evidence injury, no tenderness, facial buttresses intact without stepoff. Face/sinus: No tenderness to palpation and percussion. Facial movement is normal and symmetric. Eyes: PERRL, EOMI. No scleral icterus, conjunctivae clear. Neuro: CN II exam reveals vision grossly intact.  No nystagmus at any point of gaze. Ears: Auricles well formed without lesions.  Ear canals are intact with eczematous changes noted within the ear canals.  The TMs are intact without fluid. Nose: External evaluation reveals normal support and skin without lesions.  Dorsum is intact.  Anterior rhinoscopy reveals congested mucosa over anterior aspect of inferior turbinates and intact septum.  No purulence noted. Oral:  Oral cavity and oropharynx are intact, symmetric, without erythema or edema.  Mucosa is moist without lesions. Neck: Full range of motion without pain.  There is no significant lymphadenopathy.  No masses palpable.  Thyroid bed within normal limits to palpation.  Parotid glands and submandibular glands equal bilaterally without mass.  Trachea is midline. Neuro:  CN 2-12 grossly intact.   Her hearing test shows minimal bilateral sensorineural hearing loss.  Assessment: 1.  Bilateral chronic eczematous otitis externa. 2.   Minimal bilateral sensorineural hearing loss.  Plan: 1.  The physical exam findings and the hearing test results are reviewed with the patient. 2.  The patient is reassured that no significant hearing loss is noted. 3.  Elocon cream to treat the chronic eczematous otitis externa. 4.  The patient is encouraged to call with any questions or concerns.  Santino Kinsella W Decklin Weddington 01/02/2024, 10:20 AM

## 2024-01-03 ENCOUNTER — Encounter: Payer: Self-pay | Admitting: Audiology

## 2024-01-25 NOTE — Progress Notes (Signed)
 Pt attended 12/01/23 screening event with BP of 121/75 & A1C was 5.7. At event pt did not indicate any SDOH needs. Pt also noted that she is not a smoker.  Per chart review pt does have a PCP (Megan Allen; [facility]), insurance, and is not a smoker. Pt's last appt with PCP was 05/29/2023. [facility] sent in-basket message to pt's PCP regarding results. Pt does not indicate any SDOH needs at this time.  No additional pt f/u to be scheduled at this time per health equity protocol

## 2024-03-14 ENCOUNTER — Ambulatory Visit: Attending: Internal Medicine | Admitting: Internal Medicine

## 2024-03-14 ENCOUNTER — Encounter: Payer: Self-pay | Admitting: Internal Medicine

## 2024-03-14 VITALS — BP 104/70 | HR 69 | Temp 98.0°F | Ht 65.0 in | Wt 160.0 lb

## 2024-03-14 DIAGNOSIS — L309 Dermatitis, unspecified: Secondary | ICD-10-CM | POA: Diagnosis not present

## 2024-03-14 MED ORDER — TRIAMCINOLONE ACETONIDE 0.1 % EX CREA: 1.0000 | TOPICAL_CREAM | Freq: Two times a day (BID) | CUTANEOUS | 0 refills | Status: DC

## 2024-03-14 NOTE — Progress Notes (Signed)
 Patient ID: Megan Allen, female    DOB: 1956/02/15  MRN: [id number]  CC: Urticaria (Red itchy hives X2 weeks & spreading )   Subjective: Megan Allen is a 68 y.o. female who presents for acute care visit.  PCP is Dr. Adan Holms Her concerns today include:  Patient with history of HTN, vitamin D  deficiency, chronic migraine, obesity, sensory neuronal hearing loss bilaterally Discussed the use of AI scribe software for clinical note transcription with the patient, who gave verbal consent to proceed.  History of Present Illness Megan Allen is a 68 year old female who presents with a rash.  The rash has been present for two weeks, located on her left arm, left leg, and upper back, predominantly on the right side. It consists of red, itchy spots, especially on the knee and back, without associated pain. She recently switched to a new laundry detergent, 'ALL', about two to three weeks ago. She applies over-the-counter Benadryl cream, which alleviates the itching. Her daughter occasionally brings a dog and a cat in cages to her home, but she has no direct contact with these animals. There have been no recent outdoor activities, mosquito bites, or other insect bites that she is aware of.  No one else in the house with rash.    Patient Active Problem List   Diagnosis Date Noted   Chronic eczematous otitis externa of both ears 01/02/2024   Sensorineural hearing loss, bilateral 01/02/2024   Hyperlipidemia 05/10/2022   Moderate mitral regurgitation 05/06/2021   Obesity (BMI 30.0-34.9) 05/06/2021   Pure hypercholesterolemia 05/06/2021   Atypical chest pain 05/06/2021   Insect bite of left thigh 06/25/2020   Chronic migraine 05/29/2018   Chronic nonintractable headache 01/30/2018   Blurry vision 01/30/2018   Back pain 10/13/2016   Pain in joint, shoulder region 09/13/2016   Pain in joint, ankle and foot 09/13/2016     Current Outpatient Medications on File Prior to Visit   Medication Sig Dispense Refill   atorvastatin  (LIPITOR) 10 MG tablet TAKE 1 TABLET BY MOUTH EVERY DAY 90 tablet 1   Blood Glucose Monitoring Suppl (ONETOUCH VERIO) w/Device KIT Test blood sugar 3 times daily 1 kit 0   glucose blood (ONETOUCH VERIO) test strip Use as instructed 100 each 12   Lancets (ONETOUCH DELICA PLUS LANCET33G) MISC Use to test blood sugar 3 times a day 100 each 12   mometasone  (ELOCON ) 0.1 % cream Apply topically daily as needed for itch 15 g 3   acetaminophen (TYLENOL) 650 MG CR tablet Take 650 mg by mouth as needed for pain. (Patient not taking: Reported on 03/14/2024)     Ascorbic Acid (VITAMIN C) 1000 MG tablet Take 1,000 mg by mouth daily. (Patient not taking: Reported on 03/14/2024)     bisacodyl (DULCOLAX) 5 MG EC tablet Take 5 mg by mouth daily as needed for moderate constipation. (Patient not taking: Reported on 03/14/2024)     Calcium  Carb-Cholecalciferol 600-800 MG-UNIT TABS Take 1 tablet by mouth daily. (Patient not taking: Reported on 03/14/2024)     Cholecalciferol (VITAMIN D3 PO) Take 2,000 Units by mouth daily. (Patient not taking: Reported on 03/14/2024)     Cyanocobalamin (VITAMIN B-12 PO) Take 3,000 mcg by mouth daily. (Patient not taking: Reported on 03/14/2024)     diclofenac  Sodium (VOLTAREN ) 1 % GEL Apply 4 g topically 2 (two) times daily as needed. (Patient not taking: Reported on 03/14/2024) 100 g 1   ECHINACEA PO Take 400 mg by mouth  as needed. (Patient not taking: Reported on 03/14/2024)     EPINEPHrine  0.3 mg/0.3 mL IJ SOAJ injection Inject 0.3 mLs (0.3 mg total) into the muscle as needed for anaphylaxis (severe allergic reactions from bee or other insect stings resulting in shortness of breath). (Patient not taking: Reported on 03/14/2024) 1 each 0   famotidine  (PEPCID ) 40 MG tablet Take 1 tablet (40 mg total) by mouth daily. For five days. (Patient not taking: Reported on 03/14/2024) 5 tablet 0   fluticasone  (FLONASE ) 50 MCG/ACT nasal spray Place 2 sprays  into both nostrils daily. (Patient not taking: Reported on 03/14/2024) 16 g 6   meloxicam  (MOBIC ) 7.5 MG tablet Take 1 tablet (7.5 mg total) by mouth daily. (Patient not taking: Reported on 03/14/2024) 30 tablet 0   methocarbamol  (ROBAXIN ) 750 MG tablet Take 1 tablet (750 mg total) by mouth every 8 (eight) hours as needed for muscle spasms. (Patient not taking: Reported on 03/14/2024) 90 tablet 1   Misc. Devices MISC Left hand brace. Diagnosis - Reford Canterbury tenosynovitis (Patient not taking: Reported on 03/14/2024) 1 each 0   Multiple Vitamin (MULTIVITAMIN) capsule Take 1 capsule by mouth daily. (Patient not taking: Reported on 03/14/2024)     UNABLE TO FIND Take 1 tablet by mouth as needed. OTC Calms for simple nervous tension and occasional sleeplessness. (Patient not taking: Reported on 03/14/2024)     No current facility-administered medications on file prior to visit.    Allergies  Allergen Reactions   Bee Venom    Caffeine     Chest pain, heart racing    Social History   Socioeconomic History   Marital status: Married    Spouse name: Not on file   Number of children: 3   Years of education: some college   Highest education level: Not on file  Occupational History   Occupation: Hair Dresser  Tobacco Use   Smoking status: Never    Passive exposure: Never   Smokeless tobacco: Never  Vaping Use   Vaping status: Never Used  Substance and Sexual Activity   Alcohol use: No   Drug use: No   Sexual activity: Yes    Birth control/protection: Surgical  Other Topics Concern   Not on file  Social History Narrative   Lives at home with her husband.   Right-handed.   No caffeine per day.   Social Drivers of Corporate investment banker Strain: Not on file  Food Insecurity: No Food Insecurity (12/01/2023)   Hunger Vital Sign    Worried About Running Out of Food in the Last Year: Never true    Ran Out of Food in the Last Year: Never true  Transportation Needs: No Transportation Needs  (12/01/2023)   PRAPARE - Administrator, Civil Service (Medical): No    Lack of Transportation (Non-Medical): No  Physical Activity: Not on file  Stress: Not on file  Social Connections: Socially Integrated (11/01/2023)   Social Connection and Isolation Panel [NHANES]    Frequency of Communication with Friends and Family: More than three times a week    Frequency of Social Gatherings with Friends and Family: More than three times a week    Attends Religious Services: 1 to 4 times per year    Active Member of [employer] or Organizations: Yes    Attends Banker Meetings: 1 to 4 times per year    Marital Status: Married  Catering manager Violence: Not At Risk (12/01/2023)   Humiliation,  Afraid, Rape, and Kick questionnaire    Fear of Current or Ex-Partner: No    Emotionally Abused: No    Physically Abused: No    Sexually Abused: No    Family History  Problem Relation Age of Onset   Hyperlipidemia Mother    Diabetes Father    Diabetes Brother    Heart disease Daughter    Stroke Maternal Grandfather     Past Surgical History:  Procedure Laterality Date   ABDOMINAL HYSTERECTOMY     partial   BREAST CYST ASPIRATION     COLONOSCOPY WITH PROPOFOL  N/A 10/12/2020   Procedure: COLONOSCOPY WITH PROPOFOL ;  Surgeon: Vinetta Greening, DO;  [facility];  Service: Endoscopy;  Laterality: N/A;  12:45   POLYPECTOMY  10/12/2020   Procedure: POLYPECTOMY;  Surgeon: Vinetta Greening, DO;  [facility];  Service: Endoscopy;;    ROS: Review of Systems Negative except as stated above  PHYSICAL EXAM: BP 104/70 (BP Location: Left Arm, Patient Position: Sitting, Cuff Size: Normal)   Pulse 69   Temp 98 F (36.7 C) (Oral)   Ht 5\' 5"  (1.651 m)   Wt 160 lb (72.6 kg)   SpO2 99%   BMI 26.63 kg/m   Physical Exam  General appearance - alert, well appearing, and in no distress Mental status - normal mood, behavior, speech, dress, motor activity, and thought  processes Skin -patient has several scattered small, erythematous macular areas on the left lower leg, left arm and upper back on the right side.  They are not tender to touch.      Latest Ref Rng & Units 05/29/2023   11:21 AM 08/31/2022    9:46 AM 05/13/2022   11:06 AM  CMP  Glucose 70 - 99 mg/dL 87  86  82   BUN 8 - 27 mg/dL 15  15  15    Creatinine 0.57 - 1.00 mg/dL 0.98  1.19  1.47   Sodium 134 - 144 mmol/L 142  141  140   Potassium 3.5 - 5.2 mmol/L 4.3  4.4  4.0   Chloride 96 - 106 mmol/L 104  102  102   CO2 20 - 29 mmol/L 26  25  24    Calcium  8.7 - 10.3 mg/dL 9.5  82.9  9.6   Total Protein 6.0 - 8.5 g/dL 6.4  6.3  6.5   Total Bilirubin 0.0 - 1.2 mg/dL 0.4  0.3  0.3   Alkaline Phos 44 - 121 IU/L 86  84  97   AST 0 - 40 IU/L 16  18  18    ALT 0 - 32 IU/L 15  16  15     Lipid Panel     Component Value Date/Time   CHOL 154 12/01/2023 1045   TRIG 45 12/01/2023 1045   HDL 62 12/01/2023 1045   CHOLHDL 2.1 05/29/2023 1121   CHOLHDL 3.5 09/13/2016 1202   VLDL 12 09/13/2016 1202   LDLCALC 82 12/01/2023 1045    CBC    Component Value Date/Time   WBC 3.7 05/29/2023 1121   WBC 6.9 01/11/2021 1628   RBC 4.61 05/29/2023 1121   RBC 4.39 01/11/2021 1628   HGB 13.6 05/29/2023 1121   HCT 42.3 05/29/2023 1121   PLT 163 05/29/2023 1121   MCV 92 05/29/2023 1121   MCH 29.5 05/29/2023 1121   MCH 31.0 01/11/2021 1628   MCHC 32.2 05/29/2023 1121   MCHC 33.4 01/11/2021 1628   RDW 12.7 05/29/2023 1121  LYMPHSABS 1.5 05/29/2023 1121   MONOABS 448 09/13/2016 1202   EOSABS 0.0 05/29/2023 1121   BASOSABS 0.0 05/29/2023 1121    ASSESSMENT AND PLAN: Assessment & Plan Dermatitis Erythematous, pruritic rash on arms, left leg, and back, likely insect bites. Herpes zoster unlikely. - Prescribed triamcinolone cream, apply twice daily to affected areas, avoid face. - Advised follow-up if no improvement or worsening. - Prescription sent to [facility] on [address].  Patient was given the  opportunity to ask questions.  Patient verbalized understanding of the plan and was able to repeat key elements of the plan.   This documentation was completed using Paediatric nurse.  Any transcriptional errors are unintentional.  No orders of the defined types were placed in this encounter.    Requested Prescriptions   Signed Prescriptions Disp Refills   triamcinolone cream (KENALOG) 0.1 % 30 g 0    Sig: Apply 1 Application topically 2 (two) times daily.    Return if symptoms worsen or fail to improve.  Concetta Dee, MD, FACP

## 2024-05-15 ENCOUNTER — Other Ambulatory Visit: Payer: Self-pay | Admitting: Internal Medicine

## 2024-05-15 ENCOUNTER — Other Ambulatory Visit: Payer: Self-pay | Admitting: Family Medicine

## 2024-05-15 DIAGNOSIS — L309 Dermatitis, unspecified: Secondary | ICD-10-CM

## 2024-06-05 ENCOUNTER — Encounter: Admitting: Family Medicine

## 2024-07-03 ENCOUNTER — Encounter: Admitting: Family Medicine

## 2024-07-11 ENCOUNTER — Ambulatory Visit: Attending: Family Medicine | Admitting: Family Medicine

## 2024-07-11 ENCOUNTER — Encounter: Payer: Self-pay | Admitting: Family Medicine

## 2024-07-11 ENCOUNTER — Other Ambulatory Visit (HOSPITAL_COMMUNITY)
Admission: RE | Admit: 2024-07-11 | Discharge: 2024-07-11 | Disposition: A | Discharge status: Home / Self Care | Source: Ambulatory Visit | Attending: Family Medicine | Admitting: Family Medicine

## 2024-07-11 VITALS — BP 113/72 | HR 63 | Ht 65.0 in | Wt 162.8 lb

## 2024-07-11 DIAGNOSIS — N898 Other specified noninflammatory disorders of vagina: Secondary | ICD-10-CM

## 2024-07-11 DIAGNOSIS — R140 Abdominal distension (gaseous): Secondary | ICD-10-CM | POA: Diagnosis not present

## 2024-07-11 DIAGNOSIS — Z79899 Other long term (current) drug therapy: Secondary | ICD-10-CM | POA: Diagnosis not present

## 2024-07-11 DIAGNOSIS — J302 Other seasonal allergic rhinitis: Secondary | ICD-10-CM | POA: Diagnosis not present

## 2024-07-11 DIAGNOSIS — Z113 Encounter for screening for infections with a predominantly sexual mode of transmission: Secondary | ICD-10-CM | POA: Diagnosis not present

## 2024-07-11 DIAGNOSIS — R7303 Prediabetes: Secondary | ICD-10-CM | POA: Diagnosis not present

## 2024-07-11 DIAGNOSIS — Z0001 Encounter for general adult medical examination with abnormal findings: Secondary | ICD-10-CM

## 2024-07-11 DIAGNOSIS — Z23 Encounter for immunization: Secondary | ICD-10-CM | POA: Diagnosis not present

## 2024-07-11 DIAGNOSIS — Z130 Encounter for screening for diseases of the blood and blood-forming organs and certain disorders involving the immune mechanism: Secondary | ICD-10-CM | POA: Diagnosis not present

## 2024-07-11 DIAGNOSIS — E7849 Other hyperlipidemia: Secondary | ICD-10-CM

## 2024-07-11 MED ORDER — CETIRIZINE HCL 10 MG PO TABS
10.0000 mg | ORAL_TABLET | Freq: Every day | ORAL | 1 refills | Status: DC
Start: 2024-07-11 — End: 2024-08-02

## 2024-07-11 NOTE — Progress Notes (Signed)
 Subjective:  Patient ID: Megan Allen, female    DOB: 12-Mar-1956  Age: 68 y.o. MRN: [id number]  CC: Annual Exam (Vaginal discharge)     Discussed the use of AI scribe software for clinical note transcription with the patient, who gave verbal consent to proceed.  History of Present Illness Megan Allen is a 68 year old female with a history of hyperlipidemia, prediabetes who presents for an annual exam. She is up-to-date on mammogram from 08/2023 which was negative for malignancy. Screening for colon cancer completed in 2021 next screening recommended in 2031.  She has prediabetes with a previous A1c of 5.9. She experiences a burning sensation and light headache, possibly related to allergies, and uses an over-the-counter allergy medication. She has a runny nose and has used a nasal spray in the past, with no nasal congestion.  She takes atorvastatin  for cholesterol management. She experiences stomach growling, bubbling, and occasional sharp stomach pain, with no burning or heartburn, and uses Gas-X for relief.  She has a light vaginal discharge, which is a concern for her.  No urinary symptoms.  She exercises regularly on an elliptical machine at home about three times a week and maintains regular dental and eye check-ups.    Past Medical History:  Diagnosis Date   Atypical chest pain 05/06/2021   Chronic back pain    Headache    Moderate mitral regurgitation 05/06/2021   Murmur    Muscle spasm    Obesity (BMI 30.0-34.9) 05/06/2021   Pure hypercholesterolemia 05/06/2021    Past Surgical History:  Procedure Laterality Date   ABDOMINAL HYSTERECTOMY     partial   BREAST CYST ASPIRATION     COLONOSCOPY WITH PROPOFOL  N/A 10/12/2020   Procedure: COLONOSCOPY WITH PROPOFOL ;  Surgeon: Cindie Carlin POUR, DO;  [facility]: [facility];  Service: Endoscopy;  Laterality: N/A;  12:45   POLYPECTOMY  10/12/2020   Procedure: POLYPECTOMY;  Surgeon: Cindie Carlin POUR, DO;   Location: [facility];  Service: Endoscopy;;    Family History  Problem Relation Age of Onset   Hyperlipidemia Mother    Diabetes Father    Diabetes Brother    Heart disease Daughter    Stroke Maternal Grandfather     Social History   Socioeconomic History   Marital status: Married    Spouse name: Not on file   Number of children: 3   Years of education: some college   Highest education level: Not on file  Occupational History   Occupation: Hair Dresser  Tobacco Use   Smoking status: Never    Passive exposure: Never   Smokeless tobacco: Never  Vaping Use   Vaping status: Never Used  Substance and Sexual Activity   Alcohol use: No   Drug use: No   Sexual activity: Yes    Birth control/protection: Surgical  Other Topics Concern   Not on file  Social History Narrative   Lives at home with her husband.   Right-handed.   No caffeine per day.   Social Drivers of Corporate investment banker Strain: Not on file  Food Insecurity: No Food Insecurity (12/01/2023)   Hunger Vital Sign    Worried About Running Out of Food in the Last Year: Never true    Ran Out of Food in the Last Year: Never true  Transportation Needs: No Transportation Needs (12/01/2023)   PRAPARE - Administrator, Civil Service (Medical): No    Lack of Transportation (Non-Medical): No  Physical Activity: Not on file  Stress: Not on file  Social Connections: Socially Integrated (11/01/2023)   Social Connection and Isolation Panel    Frequency of Communication with Friends and Family: More than three times a week    Frequency of Social Gatherings with Friends and Family: More than three times a week    Attends Religious Services: 1 to 4 times per year    Active Member of [employer] or Organizations: Yes    Attends Banker Meetings: 1 to 4 times per year    Marital Status: Married    Allergies  Allergen Reactions   Bee Venom    Caffeine     Chest pain, heart racing    Outpatient  Medications Prior to Visit  Medication Sig Dispense Refill   acetaminophen (TYLENOL) 650 MG CR tablet Take 650 mg by mouth as needed for pain.     Ascorbic Acid (VITAMIN C) 1000 MG tablet Take 1,000 mg by mouth daily.     atorvastatin  (LIPITOR) 10 MG tablet TAKE 1 TABLET BY MOUTH EVERY DAY 90 tablet 1   bisacodyl (DULCOLAX) 5 MG EC tablet Take 5 mg by mouth daily as needed for moderate constipation.     Blood Glucose Monitoring Suppl (ONETOUCH VERIO) w/Device KIT Test blood sugar 3 times daily 1 kit 0   Calcium  Carb-Cholecalciferol 600-800 MG-UNIT TABS Take 1 tablet by mouth daily.     Cholecalciferol (VITAMIN D3 PO) Take 2,000 Units by mouth daily.     Cyanocobalamin (VITAMIN B-12 PO) Take 3,000 mcg by mouth daily.     diclofenac  Sodium (VOLTAREN ) 1 % GEL Apply 4 g topically 2 (two) times daily as needed. 100 g 1   ECHINACEA PO Take 400 mg by mouth as needed.     EPINEPHrine  0.3 mg/0.3 mL IJ SOAJ injection Inject 0.3 mLs (0.3 mg total) into the muscle as needed for anaphylaxis (severe allergic reactions from bee or other insect stings resulting in shortness of breath). 1 each 0   fluticasone  (FLONASE ) 50 MCG/ACT nasal spray Place 2 sprays into both nostrils daily. 16 g 6   glucose blood (ONETOUCH VERIO) test strip Use as instructed 100 each 12   Lancets (ONETOUCH DELICA PLUS LANCET33G) MISC Use to test blood sugar 3 times a day 100 each 12   meloxicam  (MOBIC ) 7.5 MG tablet Take 1 tablet (7.5 mg total) by mouth daily. 30 tablet 0   Misc. Devices MISC Left hand brace. Diagnosis - De Quervain tenosynovitis 1 each 0   mometasone  (ELOCON ) 0.1 % cream Apply topically daily as needed for itch 15 g 3   Multiple Vitamin (MULTIVITAMIN) capsule Take 1 capsule by mouth daily.     triamcinolone  cream (KENALOG ) 0.1 % APPLY TO AFFECTED AREA TWICE A DAY 30 g 0   UNABLE TO FIND Take 1 tablet by mouth as needed. OTC Calms for simple nervous tension and occasional sleeplessness.     famotidine  (PEPCID ) 40 MG  tablet Take 1 tablet (40 mg total) by mouth daily. For five days. 5 tablet 0   methocarbamol  (ROBAXIN ) 750 MG tablet Take 1 tablet (750 mg total) by mouth every 8 (eight) hours as needed for muscle spasms. 90 tablet 1   No facility-administered medications prior to visit.     ROS Review of Systems  Constitutional:  Negative for activity change and appetite change.  HENT:  Positive for rhinorrhea. Negative for sinus pressure and sore throat.   Respiratory:  Negative for chest tightness, shortness  of breath and wheezing.   Cardiovascular:  Negative for chest pain and palpitations.  Gastrointestinal:  Negative for abdominal distention, abdominal pain and constipation.  Genitourinary:  Positive for vaginal discharge.  Musculoskeletal: Negative.   Neurological:  Positive for headaches.  Psychiatric/Behavioral:  Negative for behavioral problems and dysphoric mood.     Objective:  BP 113/72   Pulse 63   Ht 5' 5 (1.651 m)   Wt 162 lb 12.8 oz (73.8 kg)   SpO2 99%   BMI 27.09 kg/m      07/11/2024   10:29 AM 03/14/2024    2:09 PM 01/01/2024    2:39 PM  BP/Weight  Systolic BP 113 104 116  Diastolic BP 72 70 74  Wt. (Lbs) 162.8 160 160  BMI 27.09 kg/m2 26.63 kg/m2 26.22 kg/m2      Physical Exam Constitutional:      General: She is not in acute distress.    Appearance: She is well-developed. She is not diaphoretic.  HENT:     Head: Normocephalic.     Right Ear: External ear normal.     Left Ear: External ear normal.     Nose: Nose normal.     Mouth/Throat:     Mouth: Mucous membranes are moist.  Eyes:     Extraocular Movements: Extraocular movements intact.     Conjunctiva/sclera: Conjunctivae normal.     Pupils: Pupils are equal, round, and reactive to light.  Neck:     Vascular: No JVD.  Cardiovascular:     Rate and Rhythm: Normal rate and regular rhythm.     Pulses: Normal pulses.     Heart sounds: Normal heart sounds. No murmur heard.    No gallop.  Pulmonary:      Effort: Pulmonary effort is normal. No respiratory distress.     Breath sounds: Normal breath sounds. No wheezing or rales.  Chest:     Chest wall: No tenderness.  Abdominal:     General: Bowel sounds are normal. There is no distension.     Palpations: Abdomen is soft. There is no mass.     Tenderness: There is no abdominal tenderness.  Musculoskeletal:        General: No tenderness. Normal range of motion.     Cervical back: Normal range of motion.  Skin:    General: Skin is warm and dry.  Neurological:     Mental Status: She is alert and oriented to person, place, and time.     Deep Tendon Reflexes: Reflexes are normal and symmetric.  Psychiatric:        Mood and Affect: Mood normal.        Latest Ref Rng & Units 05/29/2023   11:21 AM 08/31/2022    9:46 AM 05/13/2022   11:06 AM  CMP  Glucose 70 - 99 mg/dL 87  86  82   BUN 8 - 27 mg/dL 15  15  15    Creatinine 0.57 - 1.00 mg/dL 9.27  9.25  9.20   Sodium 134 - 144 mmol/L 142  141  140   Potassium 3.5 - 5.2 mmol/L 4.3  4.4  4.0   Chloride 96 - 106 mmol/L 104  102  102   CO2 20 - 29 mmol/L 26  25  24    Calcium  8.7 - 10.3 mg/dL 9.5  89.9  9.6   Total Protein 6.0 - 8.5 g/dL 6.4  6.3  6.5   Total Bilirubin 0.0 - 1.2 mg/dL 0.4  0.3  0.3   Alkaline Phos 44 - 121 IU/L 86  84  97   AST 0 - 40 IU/L 16  18  18    ALT 0 - 32 IU/L 15  16  15      Lipid Panel     Component Value Date/Time   CHOL 154 12/01/2023 1045   TRIG 45 12/01/2023 1045   HDL 62 12/01/2023 1045   CHOLHDL 2.1 05/29/2023 1121   CHOLHDL 3.5 09/13/2016 1202   VLDL 12 09/13/2016 1202   LDLCALC 82 12/01/2023 1045    CBC    Component Value Date/Time   WBC 3.7 05/29/2023 1121   WBC 6.9 01/11/2021 1628   RBC 4.61 05/29/2023 1121   RBC 4.39 01/11/2021 1628   HGB 13.6 05/29/2023 1121   HCT 42.3 05/29/2023 1121   PLT 163 05/29/2023 1121   MCV 92 05/29/2023 1121   MCH 29.5 05/29/2023 1121   MCH 31.0 01/11/2021 1628   MCHC 32.2 05/29/2023 1121   MCHC 33.4  01/11/2021 1628   RDW 12.7 05/29/2023 1121   LYMPHSABS 1.5 05/29/2023 1121   MONOABS 448 09/13/2016 1202   EOSABS 0.0 05/29/2023 1121   BASOSABS 0.0 05/29/2023 1121    Lab Results  Component Value Date   HGBA1C 5.9 (H) 05/29/2023       Assessment & Plan Annual visit for adult general medical exam with abnormal findings Counseled on 150 minutes of exercise per week, healthy eating (including decreased daily intake of saturated fats, cholesterol, added sugars, sodium), routine healthcare maintenance. Up-to-date on screening for breast and colon cancer.  Pap smear no longer indicated due to age.  Vaginal discharge Light vaginal discharge with differential diagnosis of bacterial or yeast infection. - Provide swab for vaginal discharge. - Send swab to lab for bacterial and yeast infection testing.  Gassiness Stomach growling and sharp abdominal pain likely due to gas. - Recommend Gas-X for gas relief.  Allergic rhinitis Burning sensation, light headache, nasal congestion, and runny nose suggest allergies. - Prescribe Zyrtec  10 mg once daily.  Prediabetes Previous A1c of 5.9 indicates prediabetes, requiring monitoring. -Continue dietary modifications to prevent progression to type 2 diabetes mellitus - Order A1c test.  Hyperlipidemia Continues atorvastatin  for cholesterol management. -Controlled - Continue atorvastatin  10 mg daily. - Low-cholesterol diet      Meds ordered this encounter  Medications   cetirizine  (ZYRTEC ) 10 MG tablet    Sig: Take 1 tablet (10 mg total) by mouth daily.    Dispense:  30 tablet    Refill:  1    Follow-up: Return in about 6 months (around 01/08/2025) for Chronic medical conditions.       Corrina Sabin, MD, FAAFP. [facility] [place], [place] [phone]   07/11/2024, 10:53 AM

## 2024-07-11 NOTE — Addendum Note (Signed)
 Addended by: LONITA FLORETTE GAILS on: 07/11/2024 11:03 AM   Modules accepted: Orders

## 2024-07-11 NOTE — Patient Instructions (Signed)

## 2024-07-12 LAB — HEMOGLOBIN A1C
Est. average glucose Bld gHb Est-mCnc: 117 mg/dL
Hgb A1c MFr Bld: 5.7 % — ABNORMAL HIGH (ref 4.8–5.6)

## 2024-07-12 LAB — CBC WITH DIFFERENTIAL/PLATELET
Basophils Absolute: 0 x10E3/uL (ref 0.0–0.2)
Basos: 0 %
EOS (ABSOLUTE): 0 x10E3/uL (ref 0.0–0.4)
Eos: 0 %
Hematocrit: 43.5 % (ref 34.0–46.6)
Hemoglobin: 14.1 g/dL (ref 11.1–15.9)
Immature Grans (Abs): 0 x10E3/uL (ref 0.0–0.1)
Immature Granulocytes: 0 %
Lymphocytes Absolute: 1.8 x10E3/uL (ref 0.7–3.1)
Lymphs: 39 %
MCH: 31.1 pg (ref 26.6–33.0)
MCHC: 32.4 g/dL (ref 31.5–35.7)
MCV: 96 fL (ref 79–97)
Monocytes Absolute: 0.4 x10E3/uL (ref 0.1–0.9)
Monocytes: 9 %
Neutrophils Absolute: 2.4 x10E3/uL (ref 1.4–7.0)
Neutrophils: 52 %
Platelets: 164 x10E3/uL (ref 150–450)
RBC: 4.54 x10E6/uL (ref 3.77–5.28)
RDW: 12.8 % (ref 11.7–15.4)
WBC: 4.6 x10E3/uL (ref 3.4–10.8)

## 2024-07-12 LAB — LP+NON-HDL CHOLESTEROL
Cholesterol, Total: 135 mg/dL (ref 100–199)
HDL: 59 mg/dL (ref >39)
LDL Chol Calc (NIH): 67 mg/dL (ref 0–99)
Total Non-HDL-Chol (LDL+VLDL): 76 mg/dL (ref 0–129)
Triglycerides: 37 mg/dL (ref 0–149)
VLDL Cholesterol Cal: 9 mg/dL (ref 5–40)

## 2024-07-12 LAB — CMP14+EGFR
ALT: 12 IU/L (ref 0–32)
AST: 18 IU/L (ref 0–40)
Albumin: 4.4 g/dL (ref 3.9–4.9)
Alkaline Phosphatase: 82 IU/L (ref 49–135)
BUN/Creatinine Ratio: 25 (ref 12–28)
BUN: 20 mg/dL (ref 8–27)
Bilirubin Total: 0.3 mg/dL (ref 0.0–1.2)
CO2: 24 mmol/L (ref 20–29)
Calcium: 9.9 mg/dL (ref 8.7–10.3)
Chloride: 103 mmol/L (ref 96–106)
Creatinine, Ser: 0.8 mg/dL (ref 0.57–1.00)
Globulin, Total: 2.2 g/dL (ref 1.5–4.5)
Glucose: 86 mg/dL (ref 70–99)
Potassium: 4.5 mmol/L (ref 3.5–5.2)
Sodium: 140 mmol/L (ref 134–144)
Total Protein: 6.6 g/dL (ref 6.0–8.5)
eGFR: 80 mL/min/1.73 (ref >59)

## 2024-07-15 ENCOUNTER — Ambulatory Visit: Payer: Self-pay | Admitting: Family Medicine

## 2024-07-15 LAB — CERVICOVAGINAL ANCILLARY ONLY
Bacterial Vaginitis (gardnerella): NEGATIVE
Candida Glabrata: NEGATIVE
Candida Vaginitis: NEGATIVE
Chlamydia: NEGATIVE
Comment: NEGATIVE
Comment: NEGATIVE
Comment: NEGATIVE
Comment: NEGATIVE
Comment: NEGATIVE
Comment: NORMAL
Neisseria Gonorrhea: NEGATIVE
Trichomonas: NEGATIVE

## 2024-08-02 ENCOUNTER — Other Ambulatory Visit: Payer: Self-pay | Admitting: Family Medicine

## 2024-08-06 DIAGNOSIS — Z6825 Body mass index (BMI) 25.0-25.9, adult: Secondary | ICD-10-CM | POA: Diagnosis not present

## 2024-08-06 DIAGNOSIS — Z008 Encounter for other general examination: Secondary | ICD-10-CM | POA: Diagnosis not present

## 2024-08-06 DIAGNOSIS — M1990 Unspecified osteoarthritis, unspecified site: Secondary | ICD-10-CM | POA: Diagnosis not present

## 2024-08-06 DIAGNOSIS — R7303 Prediabetes: Secondary | ICD-10-CM | POA: Diagnosis not present

## 2024-08-06 DIAGNOSIS — G43909 Migraine, unspecified, not intractable, without status migrainosus: Secondary | ICD-10-CM | POA: Diagnosis not present

## 2024-08-06 DIAGNOSIS — E663 Overweight: Secondary | ICD-10-CM | POA: Diagnosis not present

## 2024-08-09 ENCOUNTER — Other Ambulatory Visit: Payer: Self-pay | Admitting: Family Medicine

## 2024-08-16 ENCOUNTER — Ambulatory Visit: Payer: Self-pay

## 2024-08-16 ENCOUNTER — Emergency Department (HOSPITAL_BASED_OUTPATIENT_CLINIC_OR_DEPARTMENT_OTHER)

## 2024-08-16 ENCOUNTER — Other Ambulatory Visit: Payer: Self-pay

## 2024-08-16 ENCOUNTER — Encounter (HOSPITAL_BASED_OUTPATIENT_CLINIC_OR_DEPARTMENT_OTHER): Payer: Self-pay | Admitting: Emergency Medicine

## 2024-08-16 ENCOUNTER — Emergency Department (HOSPITAL_BASED_OUTPATIENT_CLINIC_OR_DEPARTMENT_OTHER)
Admission: EM | Admit: 2024-08-16 | Discharge: 2024-08-17 | Disposition: A | Discharge status: Home / Self Care | Attending: Emergency Medicine | Admitting: Emergency Medicine

## 2024-08-16 DIAGNOSIS — R42 Dizziness and giddiness: Secondary | ICD-10-CM | POA: Diagnosis present

## 2024-08-16 LAB — COMPREHENSIVE METABOLIC PANEL WITH GFR
ALT: 13 U/L (ref 0–44)
AST: 18 U/L (ref 15–41)
Albumin: 4.5 g/dL (ref 3.5–5.0)
Alkaline Phosphatase: 88 U/L (ref 38–126)
Anion gap: 11 (ref 5–15)
BUN: 27 mg/dL — ABNORMAL HIGH (ref 8–23)
CO2: 23 mmol/L (ref 22–32)
Calcium: 9.3 mg/dL (ref 8.9–10.3)
Chloride: 105 mmol/L (ref 98–111)
Creatinine, Ser: 0.73 mg/dL (ref 0.44–1.00)
GFR, Estimated: >60 mL/min (ref >60)
Glucose, Bld: 104 mg/dL — ABNORMAL HIGH (ref 70–99)
Potassium: 4.3 mmol/L (ref 3.5–5.1)
Sodium: 139 mmol/L (ref 135–145)
Total Bilirubin: 0.2 mg/dL (ref 0.0–1.2)
Total Protein: 6.6 g/dL (ref 6.5–8.1)

## 2024-08-16 LAB — URINALYSIS, MICROSCOPIC (REFLEX): RBC / HPF: NONE SEEN RBC/hpf (ref 0–5)

## 2024-08-16 LAB — URINALYSIS, ROUTINE W REFLEX MICROSCOPIC
Bilirubin Urine: NEGATIVE
Glucose, UA: NEGATIVE mg/dL
Hgb urine dipstick: NEGATIVE
Ketones, ur: NEGATIVE mg/dL
Nitrite: NEGATIVE
Protein, ur: NEGATIVE mg/dL
Specific Gravity, Urine: 1.02 (ref 1.005–1.030)
pH: 7 (ref 5.0–8.0)

## 2024-08-16 LAB — CBC WITH DIFFERENTIAL/PLATELET
Abs Immature Granulocytes: 0.02 K/uL (ref 0.00–0.07)
Basophils Absolute: 0 K/uL (ref 0.0–0.1)
Basophils Relative: 1 %
Eosinophils Absolute: 0.1 K/uL (ref 0.0–0.5)
Eosinophils Relative: 1 %
HCT: 40.4 % (ref 36.0–46.0)
Hemoglobin: 13.3 g/dL (ref 12.0–15.0)
Immature Granulocytes: 0 %
Lymphocytes Relative: 36 %
Lymphs Abs: 2.4 K/uL (ref 0.7–4.0)
MCH: 30.7 pg (ref 26.0–34.0)
MCHC: 32.9 g/dL (ref 30.0–36.0)
MCV: 93.3 fL (ref 80.0–100.0)
Monocytes Absolute: 0.6 K/uL (ref 0.1–1.0)
Monocytes Relative: 8 %
Neutro Abs: 3.5 K/uL (ref 1.7–7.7)
Neutrophils Relative %: 54 %
Platelets: 183 K/uL (ref 150–400)
RBC: 4.33 MIL/uL (ref 3.87–5.11)
RDW: 13.3 % (ref 11.5–15.5)
WBC: 6.5 K/uL (ref 4.0–10.5)
nRBC: 0 % (ref 0.0–0.2)

## 2024-08-16 MED ORDER — SODIUM CHLORIDE 0.9 % IV BOLUS
500.0000 mL | Freq: Once | INTRAVENOUS | Status: AC
  Administered 2024-08-16: 500 mL via INTRAVENOUS

## 2024-08-16 MED ORDER — MECLIZINE HCL 25 MG PO TABS
25.0000 mg | ORAL_TABLET | Freq: Once | ORAL | Status: AC
  Administered 2024-08-16: 25 mg via ORAL
  Filled 2024-08-16: qty 1

## 2024-08-16 NOTE — ED Triage Notes (Signed)
 Pt c/o dizziness x a couple days, worse today; denies other sxs

## 2024-08-16 NOTE — Telephone Encounter (Signed)
 Patient states she has had some dizziness and stumbles for some days. She denies falling.   Blood sugar was 110 today.  Does not take any medication for HTN/ blood pressure.  She was able to find BP monitor and take BP while on the phone. BP: 114/72,  HR: 77  States she deals with allergies often and is taking allergy medication.   She is [facility]able to [facility] to [facility] potential of neurological problem.    Advised to f/u with PCP after evaluation.   Patient verbalized understanding.

## 2024-08-16 NOTE — ED Notes (Signed)
 Pt transferred from [facility] to ED RM 3. Assuming pt care at this time.

## 2024-08-16 NOTE — Telephone Encounter (Signed)
  FYI Only or Action Required?: FYI only for provider.  Patient was last seen in primary care on 07/11/2024 by Newlin, Enobong, MD.  Called Nurse Triage reporting Dizziness.  Symptoms began today.  Interventions attempted: Rest, hydration, or home remedies.  Symptoms are: unchanged.  Triage Disposition: Go to ED Now (or PCP Triage)  Patient/caregiver understands and will follow disposition?: Yes  Copied from CRM [id number]. Topic: Clinical - Red Word Triage >> Aug 16, 2024  4:48 PM Zebedee SAUNDERS wrote: Red Word that prompted transfer to Nurse Triage: Pt has been dizzy all day long. Reason for Disposition  SEVERE dizziness (e.g., unable to stand, requires support to walk, feels like passing out now)  Answer Assessment - Initial Assessment Questions 1. DESCRIPTION: Describe your dizziness.      2. LIGHTHEADED: Do you feel lightheaded? (e.g., somewhat faint, woozy, weak upon standing)     Today feels dizzy at all times, whether she is laying down or walking 3. VERTIGO: Do you feel like either you or the room is spinning or tilting? (i.e., vertigo)     Feels like she is on a ship 4. SEVERITY: How bad is it?  Do you feel like you are going to faint? Can you stand and walk?     Too dizzy to walk, but does not feel like passing out 5. ONSET:  When did the dizziness begin?     today 6. AGGRAVATING FACTORS: Does anything make it worse? (e.g., standing, change in head position)     denies 8. CAUSE: What do you think is causing the dizziness? (e.g., decreased fluids or food, diarrhea, emotional distress, heat exposure, new medicine, sudden standing, vomiting; unknown)     unsure 9. RECURRENT SYMPTOM: Have you had dizziness before? If Yes, ask: When was the last time? What happened that time?     States that she had dizziness due to inner ear years ago 10. OTHER SYMPTOMS: Do you have any other symptoms? (e.g., fever, chest pain, vomiting, diarrhea, bleeding)        Earlier this week, she felt like she was stumbling while walking Denies chest pain or difficulty breathing. Denies any numbness, tingling, or weakness one single side of body 11. PREGNANCY: Is there any chance you are pregnant? When was your last menstrual period?       N/a  Protocols used: Dizziness - Lightheadedness-A-AH

## 2024-08-16 NOTE — ED Provider Notes (Signed)
 [place] [facility] Provider Note   CSN: [id number] Arrival date & time: 08/16/24  8145     Patient presents with: Dizziness   Megan Allen is a 68 y.o. female.   Patient is a 68 year old female with a history of hyperlipidemia and heart murmur who presents with feeling off balance and dizziness.  She says she has been feeling off balance for the last 2 to 3 days.  Her family at bedside says she has been stumbling.  Although she does not report any dizziness until this morning.  She woke up with a spinning sensation this morning which felt like her prior vertigo.  She denies any headache.  No vision changes.  No speech deficits.  No numbness or weakness in her extremities.  No chest pain or shortness of breath.  No fevers.  No cough or cold symptoms.  No recent head trauma.       Prior to Admission medications   Medication Sig Start Date End Date Taking? Authorizing Provider  acetaminophen (TYLENOL) 650 MG CR tablet Take 650 mg by mouth as needed for pain.    [provider]  Ascorbic Acid (VITAMIN C) 1000 MG tablet Take 1,000 mg by mouth daily.    [provider]  atorvastatin  (LIPITOR) 10 MG tablet TAKE 1 TABLET BY MOUTH EVERY DAY 05/15/24   Newlin, Enobong, MD  bisacodyl (DULCOLAX) 5 MG EC tablet Take 5 mg by mouth daily as needed for moderate constipation.    [provider]  Blood Glucose Monitoring Suppl (ONETOUCH VERIO) w/Device KIT Test blood sugar 3 times daily 06/15/23   Newlin, Enobong, MD  Calcium  Carb-Cholecalciferol 600-800 MG-UNIT TABS Take 1 tablet by mouth daily.    [provider]  cetirizine  (ZYRTEC ) 10 MG tablet TAKE 1 TABLET BY MOUTH EVERY DAY 08/02/24   Newlin, Enobong, MD  Cholecalciferol (VITAMIN D3 PO) Take 2,000 Units by mouth daily.    [provider]  Cyanocobalamin (VITAMIN B-12 PO) Take 3,000 mcg by mouth daily.    [provider]  diclofenac  Sodium  (VOLTAREN ) 1 % GEL Apply 4 g topically 2 (two) times daily as needed. 05/29/23   Newlin, Enobong, MD  ECHINACEA PO Take 400 mg by mouth as needed.    [provider]  EPINEPHrine  0.3 mg/0.3 mL IJ SOAJ injection Inject 0.3 mLs (0.3 mg total) into the muscle as needed for anaphylaxis (severe allergic reactions from bee or other insect stings resulting in shortness of breath). 06/25/20   Brien Belvie BRAVO, MD  fluticasone  (FLONASE ) 50 MCG/ACT nasal spray Place 2 sprays into both nostrils daily. 08/31/22   Danton Jon HERO, PA-C  glucose blood (ONETOUCH VERIO) test strip USE AS INSTRUCTED 08/09/24   Delbert Clam, MD  Lancets [facility] DELICA PLUS LANCET33G) MISC Use to test blood sugar 3 times a day 06/22/23   Newlin, Enobong, MD  meloxicam  (MOBIC ) 7.5 MG tablet Take 1 tablet (7.5 mg total) by mouth daily. 08/31/22   Danton Jon HERO, PA-C  Misc. Devices MISC Left hand brace. Diagnosis - Everitt Hahn tenosynovitis 08/28/23   Newlin, Enobong, MD  mometasone  (ELOCON ) 0.1 % cream Apply topically daily as needed for itch 01/01/24   Karis Clunes, MD  Multiple Vitamin (MULTIVITAMIN) capsule Take 1 capsule by mouth daily.    [provider]  triamcinolone  cream (KENALOG ) 0.1 % APPLY TO AFFECTED AREA TWICE A DAY 05/15/24   Vicci Barnie NOVAK, MD  UNABLE TO FIND Take 1 tablet  by mouth as needed. OTC Calms for simple nervous tension and occasional sleeplessness.    [provider]    Allergies: Bee venom and Caffeine    Review of Systems  Constitutional:  Negative for chills, diaphoresis, fatigue and fever.  HENT:  Negative for congestion, rhinorrhea and sneezing.   Eyes: Negative.   Respiratory:  Negative for cough, chest tightness and shortness of breath.   Cardiovascular:  Negative for chest pain and leg swelling.  Gastrointestinal:  Negative for abdominal pain, diarrhea, nausea and vomiting.  Genitourinary:  Negative for difficulty urinating, flank pain and frequency.  Musculoskeletal:   Negative for arthralgias and back pain.  Skin:  Negative for rash.  Neurological:  Positive for dizziness. Negative for speech difficulty, weakness, numbness and headaches.    Updated Vital Signs BP 103/65 (BP Location: Right Arm)   Pulse 66   Temp 97.7 F (36.5 C) (Oral)   Resp 15   Ht 5' 5 (1.651 m)   Wt 73 kg   SpO2 97%   BMI 26.79 kg/m   Physical Exam Constitutional:      Appearance: She is well-developed.  HENT:     Head: Normocephalic and atraumatic.  Eyes:     Extraocular Movements: Extraocular movements intact.     Pupils: Pupils are equal, round, and reactive to light.     Comments: Mild horizontal nystagmus with a fast component to the left.  No vertical or rotational nystagmus.  Cardiovascular:     Rate and Rhythm: Normal rate and regular rhythm.     Heart sounds: Normal heart sounds.  Pulmonary:     Effort: Pulmonary effort is normal. No respiratory distress.     Breath sounds: Normal breath sounds. No wheezing or rales.  Chest:     Chest wall: No tenderness.  Abdominal:     General: Bowel sounds are normal.     Palpations: Abdomen is soft.     Tenderness: There is no abdominal tenderness. There is no guarding or rebound.  Musculoskeletal:        General: Normal range of motion.     Cervical back: Normal range of motion and neck supple.  Lymphadenopathy:     Cervical: No cervical adenopathy.  Skin:    General: Skin is warm and dry.     Findings: No rash.  Neurological:     Mental Status: She is alert and oriented to person, place, and time.     Comments: Motor 5/5 all extremities Sensation grossly intact to LT all extremities Finger to Nose intact, no pronator drift CN II-XII grossly intact Gait normal      (all labs ordered are listed, but only abnormal results are displayed) Labs Reviewed  COMPREHENSIVE METABOLIC PANEL WITH GFR - Abnormal; Notable for the following components:      Result Value   Glucose, Bld 104 (*)    BUN 27 (*)    All  other components within normal limits  URINALYSIS, ROUTINE W REFLEX MICROSCOPIC - Abnormal; Notable for the following components:   Leukocytes,Ua TRACE (*)    All other components within normal limits  URINALYSIS, MICROSCOPIC (REFLEX) - Abnormal; Notable for the following components:   Bacteria, UA RARE (*)    All other components within normal limits  CBC WITH DIFFERENTIAL/PLATELET    EKG: EKG Interpretation Date/Time:  Friday August 16 2024 21:40:09 EDT Ventricular Rate:  65 PR Interval:  221 QRS Duration:  86 QT Interval:  417 QTC Calculation: 434 R Axis:  50  Text Interpretation: Sinus rhythm Prolonged PR interval Abnormal R-wave progression, early transition since last tracing no significant change PR interval increased Confirmed by Lenor Hollering [phone]) on 08/16/2024 10:08:50 PM  Radiology: No results found.   Procedures   Medications Ordered in the ED  meclizine  (ANTIVERT ) tablet 25 mg (25 mg Oral Given 08/16/24 2206)                                    Medical Decision Making Amount and/or Complexity of Data Reviewed Labs: ordered. Radiology: ordered.   This patient presents to the ED for concern of dizziness, this involves an extensive number of treatment options, and is a complaint that carries with it a high risk of complications and morbidity.  I considered the following differential and admission for this acute, potentially life threatening condition.  The differential diagnosis includes vertigo, posterior circulation stroke, anemia, dehydration, electrolyte abnormality, arrhythmia, infection  MDM:    Patient is a 68 year old who presents with dizziness that started this morning although she has felt off balance for the last 2 to 3 days.  Her family says she has been stumbling.  She does not report any dizziness until today.  She has some slight nystagmus on exam.  I do not appreciate any focal neurologic deficits.  Her labs are nonconcerning.  Her urine is  not concerning for infection.  EKG does not show any arrhythmia.  Patient was given dose of meclizine .  CT scan of the head is pending.  Care turned over to Dr. Roselyn pending CT scan and reevaluation.  (Labs, imaging, consults)  Labs: I Ordered, and personally interpreted labs.  The pertinent results include: Urine not consistent with infection, no significant anemia, electrolytes nonconcerning  Imaging Studies ordered: I ordered imaging studies including CT head I independently visualized and interpreted imaging. I agree with the radiologist interpretation  Additional history obtained from family at bedside.  External records from outside source obtained and reviewed including history  Cardiac Monitoring: The patient was maintained on a cardiac monitor.  If on the cardiac monitor, I personally viewed and interpreted the cardiac monitored which showed an underlying rhythm of: Sinus rhythm  Reevaluation: After the interventions noted above, I reevaluated the patient and found that they have :stayed the same  Social Determinants of Health:    Disposition: Pending  Co morbidities that complicate the patient evaluation  Past Medical History:  Diagnosis Date   Atypical chest pain 05/06/2021   Chronic back pain    Headache    Moderate mitral regurgitation 05/06/2021   Murmur    Muscle spasm    Obesity (BMI 30.0-34.9) 05/06/2021   Pure hypercholesterolemia 05/06/2021     Medicines Meds ordered this encounter  Medications   meclizine  (ANTIVERT ) tablet 25 mg    I have reviewed the patients home medicines and have made adjustments as needed  Problem List / ED Course: Problem List Items Addressed This Visit   None            Final diagnoses:  None    ED Discharge Orders     None          Lenor Hollering, MD 08/16/24 2320

## 2024-08-16 NOTE — ED Provider Notes (Signed)
 Care assumed at shift change. Here for vertiginous dizziness, labs reassuring awaiting CT. Given Meclizine .  Physical Exam  BP 93/67 (BP Location: Right Arm)   Pulse 66   Temp 97.7 F (36.5 C) (Oral)   Resp 14   Ht 5' 5 (1.651 m)   Wt 73 kg   SpO2 97%   BMI 26.79 kg/m   Physical Exam  Procedures  Procedures  ED Course / MDM   Clinical Course as of 08/17/24 0103  Sat Aug 17, 2024  0101 I personally viewed the images from radiology studies and agree with radiologist interpretation: CT is negative. Patient reports she is feeling much better after Meclizine . Discussed peripheral vs central vertigo. Given her symptoms over the last several days offered transfer to [facility] for MRI, however she and family would prefer to defer that to PCP visit. They would like to go home. Recommend she go to [facility] if symptoms worsen in the meantime or for any other concern. Rx for Meclizine .  [CS]    Clinical Course User Index [CS] Roselyn Carlin NOVAK, MD   Medical Decision Making Problems Addressed: Vertigo: acute illness or injury  Amount and/or Complexity of Data Reviewed Radiology: independent interpretation performed. Decision-making details documented in ED Course.  Risk Prescription drug management. Decision regarding hospitalization.          Roselyn Carlin NOVAK, MD 08/17/24 [phone]

## 2024-08-17 MED ORDER — MECLIZINE HCL 25 MG PO TABS: 25.0000 mg | ORAL_TABLET | Freq: Three times a day (TID) | ORAL | 0 refills | Status: AC | PRN

## 2024-08-17 NOTE — ED Notes (Signed)
 Dc instructions given , pt verbalized understanding. Pt ouver out of ED via [facility] with husband and dtr, with all belongings and paperwork not in visible distress.

## 2024-08-26 ENCOUNTER — Encounter: Payer: Self-pay | Admitting: Radiology

## 2024-08-27 ENCOUNTER — Other Ambulatory Visit: Payer: Self-pay | Admitting: Family Medicine

## 2024-08-27 DIAGNOSIS — Z1231 Encounter for screening mammogram for malignant neoplasm of breast: Secondary | ICD-10-CM

## 2024-09-03 ENCOUNTER — Ambulatory Visit
Admission: RE | Admit: 2024-09-03 | Discharge: 2024-09-03 | Disposition: A | Discharge status: Home / Self Care | Source: Ambulatory Visit | Attending: Family Medicine | Admitting: Family Medicine

## 2024-09-03 DIAGNOSIS — Z1231 Encounter for screening mammogram for malignant neoplasm of breast: Secondary | ICD-10-CM

## 2024-09-04 ENCOUNTER — Ambulatory Visit: Payer: Self-pay | Admitting: Family Medicine

## 2024-10-02 ENCOUNTER — Other Ambulatory Visit: Payer: Self-pay | Admitting: Family Medicine

## 2024-10-02 ENCOUNTER — Other Ambulatory Visit: Payer: Self-pay | Admitting: Internal Medicine

## 2024-10-02 DIAGNOSIS — L309 Dermatitis, unspecified: Secondary | ICD-10-CM

## 2024-10-15 ENCOUNTER — Ambulatory Visit: Attending: Family Medicine

## 2024-10-15 VITALS — Ht 65.0 in | Wt 163.0 lb

## 2024-10-15 DIAGNOSIS — Z0000 Encounter for general adult medical examination without abnormal findings: Secondary | ICD-10-CM | POA: Diagnosis not present

## 2024-10-15 NOTE — Patient Instructions (Signed)
 Megan Allen,  Thank you for taking the time for your Medicare Wellness Visit. I appreciate your continued commitment to your health goals. Please review the care plan we discussed, and feel free to reach out if I can assist you further.  Please note that Annual Wellness Visits do not include a physical exam. Some assessments may be limited, especially if the visit was conducted virtually. If needed, we may recommend an in-person follow-up with your provider.  Ongoing Care Seeing your primary care provider every 3 to 6 months helps us  monitor your health and provide consistent, personalized care.   Referrals If a referral was made during today's visit and you haven't received any updates within two weeks, please contact the referred provider directly to check on the status.  Recommended Screenings:  Health Maintenance  Topic Date Due   COVID-19 Vaccine (3 - Pfizer risk series) 02/18/2020   Medicare Annual Wellness Visit  06/11/2021   Pneumococcal Vaccine for age over 55 (1 of 2 - PCV) 07/11/2025*   DTaP/Tdap/Td vaccine (2 - Td or Tdap) 07/28/2025   Breast Cancer Screening  09/03/2026   Colon Cancer Screening  10/12/2030   Flu Shot  Completed   Osteoporosis screening with Bone Density Scan  Completed   Hepatitis C Screening  Completed   Zoster (Shingles) Vaccine  Completed   Meningitis B Vaccine  Aged Out  *Topic was postponed. The date shown is not the original due date.       10/15/2024    3:37 PM  Advanced Directives  Does Patient Have a Medical Advance Directive? No  Would patient like information on creating a medical advance directive? No - Patient declined    Vision: Annual vision screenings are recommended for early detection of glaucoma, cataracts, and diabetic retinopathy. These exams can also reveal signs of chronic conditions such as diabetes and high blood pressure.  Dental: Annual dental screenings help detect early signs of oral cancer, gum disease, and other  conditions linked to overall health, including heart disease and diabetes.  Please see the attached documents for additional preventive care recommendations.

## 2024-10-15 NOTE — Progress Notes (Signed)
 "  Chief Complaint  Patient presents with   Medicare Wellness    SUBSEQUENT     Subjective:   Megan Allen is a 68 y.o. female who presents for a Medicare Annual Wellness Visit.  Visit info / Clinical Intake: Medicare Wellness Visit Type:: Subsequent Annual Wellness Visit Persons participating in visit and providing information:: patient Medicare Wellness Visit Mode:: Telephone If telephone:: video declined Since this visit was completed virtually, some vitals may be partially provided or unavailable. Missing vitals are due to the limitations of the virtual format.: Documented vitals are patient reported If Telephone or Video please confirm:: I connected with patient using audio/video enable telemedicine. I verified patient identity with two identifiers, discussed telehealth limitations, and patient agreed to proceed. Patient Location:: HOME Provider Location:: HOME Interpreter Needed?: No Pre-visit prep was completed: yes AWV questionnaire completed by patient prior to visit?: no Living arrangements:: lives with spouse/significant other; with family/others Patient's Overall Health Status Rating: very good Typical amount of pain: none Does pain affect daily life?: no Are you currently prescribed opioids?: no  Dietary Habits and Nutritional Risks How many meals a day?: 3 (WATCHING WHAT I EAT TO CONTROL DIABETES) Eats fruit and vegetables daily?: yes Most meals are obtained by: preparing own meals In the last 2 weeks, have you had any of the following?: none Diabetic:: (!) yes Any non-healing wounds?: no How often do you check your BS?: 3 (FASTING, MIDDLE DAY & NIGHT) Would you like to be referred to a Nutritionist or for Diabetic Management? : no  Functional Status Activities of Daily Living (to include ambulation/medication): Independent Ambulation: Independent Medication Administration: Independent Home Management (perform basic housework or laundry):  Independent Manage your own finances?: yes Primary transportation is: driving Concerns about vision?: no *vision screening is required for WTM* Concerns about hearing?: no  Fall Screening Falls in the past year?: 0 Number of falls in past year: 0 Was there an injury with Fall?: 0 Fall Risk Category Calculator: 0 Patient Fall Risk Level: Low Fall Risk  Fall Risk Patient at Risk for Falls Due to: No Fall Risks Fall risk Follow up: Education provided; Falls evaluation completed  Home and Transportation Safety: All rugs have non-skid backing?: N/A, no rugs All stairs or steps have railings?: yes Grab bars in the bathtub or shower?: (!) no Have non-skid surface in bathtub or shower?: (!) no Good home lighting?: yes Regular seat belt use?: yes Hospital stays in the last year:: no  Cognitive Assessment Difficulty concentrating, remembering, or making decisions? : no (BSE: READING, BIBLE STUDY & QUESTIONS, WORD SEARCH) Will 6CIT or Mini Cog be Completed: yes What year is it?: 0 points What month is it?: 0 points Give patient an address phrase to remember (5 components): [facility] [address] About what time is it?: 0 points Count backwards from 20 to 1: 0 points Say the months of the year in reverse: 0 points Repeat the address phrase from earlier: 0 points 6 CIT Score: 0 points  Advance Directives (For Healthcare) Does Patient Have a Medical Advance Directive?: No Would patient like information on creating a medical advance directive?: No - Patient declined  Reviewed/Updated  Reviewed/Updated: Reviewed All (Medical, Surgical, Family, Medications, Allergies, Care Teams, Patient Goals)    Allergies (verified) Bee venom and Caffeine   Current Medications (verified) Outpatient Encounter Medications as of 10/15/2024  Medication Sig   acetaminophen (TYLENOL) 650 MG CR tablet Take 650 mg by mouth as needed for pain.   Ascorbic  Acid (VITAMIN C) 1000 MG tablet Take 1,000  mg by mouth daily.   atorvastatin  (LIPITOR) 10 MG tablet TAKE 1 TABLET BY MOUTH EVERY DAY   bisacodyl (DULCOLAX) 5 MG EC tablet Take 5 mg by mouth daily as needed for moderate constipation.   Blood Glucose Monitoring Suppl (ONETOUCH VERIO) w/Device KIT Test blood sugar 3 times daily   Calcium  Carb-Cholecalciferol 600-800 MG-UNIT TABS Take 1 tablet by mouth daily.   cetirizine  (ZYRTEC ) 10 MG tablet TAKE 1 TABLET BY MOUTH EVERY DAY   Cholecalciferol (VITAMIN D3 PO) Take 2,000 Units by mouth daily.   Cyanocobalamin (VITAMIN B-12 PO) Take 3,000 mcg by mouth daily.   diclofenac  Sodium (VOLTAREN ) 1 % GEL Apply 4 g topically 2 (two) times daily as needed.   ECHINACEA PO Take 400 mg by mouth as needed.   EPINEPHrine  0.3 mg/0.3 mL IJ SOAJ injection Inject 0.3 mLs (0.3 mg total) into the muscle as needed for anaphylaxis (severe allergic reactions from bee or other insect stings resulting in shortness of breath).   fluticasone  (FLONASE ) 50 MCG/ACT nasal spray Place 2 sprays into both nostrils daily.   glucose blood (ONETOUCH VERIO) test strip USE AS INSTRUCTED   Lancets (ONETOUCH DELICA PLUS LANCET33G) MISC USE TO TEST BLOOD SUGAR 3 TIMES A DAY   meclizine  (ANTIVERT ) 25 MG tablet Take 1 tablet (25 mg total) by mouth 3 (three) times daily as needed for dizziness.   meloxicam  (MOBIC ) 7.5 MG tablet Take 1 tablet (7.5 mg total) by mouth daily.   Misc. Devices MISC Left hand brace. Diagnosis - De Quervain tenosynovitis   mometasone  (ELOCON ) 0.1 % cream Apply topically daily as needed for itch   Multiple Vitamin (MULTIVITAMIN) capsule Take 1 capsule by mouth daily.   triamcinolone  cream (KENALOG ) 0.1 % APPLY TO AFFECTED AREA TWICE A DAY   UNABLE TO FIND Take 1 tablet by mouth as needed. OTC Calms for simple nervous tension and occasional sleeplessness.   No facility-administered encounter medications on file as of 10/15/2024.    History: Past Medical History:  Diagnosis Date   Atypical chest pain  05/06/2021   Chronic back pain    Headache    Moderate mitral regurgitation 05/06/2021   Murmur    Muscle spasm    Obesity (BMI 30.0-34.9) 05/06/2021   Pure hypercholesterolemia 05/06/2021   Past Surgical History:  Procedure Laterality Date   ABDOMINAL HYSTERECTOMY     partial   BREAST CYST ASPIRATION     COLONOSCOPY WITH PROPOFOL  N/A 10/12/2020   Procedure: COLONOSCOPY WITH PROPOFOL ;  Surgeon: Cindie Carlin POUR, DO;  Location: [facility];  Service: Endoscopy;  Laterality: N/A;  12:45   POLYPECTOMY  10/12/2020   Procedure: POLYPECTOMY;  Surgeon: Cindie Carlin POUR, DO;  Location: [facility];  Service: Endoscopy;;   Family History  Problem Relation Age of Onset   Hyperlipidemia Mother    Diabetes Father    Heart disease Daughter    Stroke Maternal Grandfather    Diabetes Brother    Breast cancer Neg Hx    Social History   Occupational History   Occupation: Hair Dresser  Tobacco Use   Smoking status: Never    Passive exposure: Never   Smokeless tobacco: Never  Vaping Use   Vaping status: Never Used  Substance and Sexual Activity   Alcohol use: No   Drug use: No   Sexual activity: Yes    Birth control/protection: Surgical   Tobacco Counseling Counseling given: Not Answered  SDOH Screenings  Food Insecurity: No Food Insecurity (10/15/2024)  Housing: Low Risk (10/15/2024)  Transportation Needs: No Transportation Needs (10/15/2024)  Utilities: Not At Risk (10/15/2024)  Alcohol Screen: Low Risk (10/15/2024)  Depression (PHQ2-9): Low Risk (10/15/2024)  Financial Resource Strain: Low Risk (10/15/2024)  Physical Activity: Sufficiently Active (10/15/2024)  Social Connections: Socially Integrated (10/15/2024)  Stress: No Stress Concern Present (10/15/2024)  Tobacco Use: Low Risk (10/15/2024)  Health Literacy: Adequate Health Literacy (10/15/2024)   See flowsheets for full screening details  Depression Screen PHQ 2 & 9 Depression Scale- Over the past 2 weeks, how  often have you been bothered by any of the following problems? Little interest or pleasure in doing things: 0 Feeling down, depressed, or hopeless (PHQ Adolescent also includes...irritable): 0 PHQ-2 Total Score: 0 Trouble falling or staying asleep, or sleeping too much: 0 Feeling tired or having little energy: 0 Poor appetite or overeating (PHQ Adolescent also includes...weight loss): 0 Feeling bad about yourself - or that you are a failure or have let yourself or your family down: 0 Trouble concentrating on things, such as reading the newspaper or watching television (PHQ Adolescent also includes...like school work): 0 Moving or speaking so slowly that other people could have noticed. Or the opposite - being so fidgety or restless that you have been moving around a lot more than usual: 0 Thoughts that you would be better off dead, or of hurting yourself in some way: 0 PHQ-9 Total Score: 0 If you checked off any problems, how difficult have these problems made it for you to do your work, take care of things at home, or get along with other people?: Not difficult at all  Depression Treatment Depression Interventions/Treatment : EYV7-0 Score <4 Follow-up Not Indicated     Goals Addressed             This Visit's Progress    10/15/2024: Diabetic Goals       To maintain my diabetes by eating healthier, checking my glucose 3 times per day and increasing my physical activity.             Objective:    Today's Vitals   10/15/24 1536  Weight: 163 lb (73.9 kg)  Height: 5' 5 (1.651 m)  PainSc: 0-No pain   Body mass index is 27.12 kg/m.  Hearing/Vision screen Hearing Screening - Comments:: Denies hearing difficulties. Vision Screening - Comments:: Cataracts removed 2025.  Eye doctor: [facility] (Dr. Lonni Gentry) Immunizations and Health Maintenance Health Maintenance  Topic Date Due   COVID-19 Vaccine (3 - Pfizer risk series) 02/18/2020   Pneumococcal  Vaccine: 50+ Years (1 of 2 - PCV) 07/11/2025 (Originally 05/24/1975)   DTaP/Tdap/Td (2 - Td or Tdap) 07/28/2025   Medicare Annual Wellness (AWV)  10/15/2025   Mammogram  09/03/2026   Colonoscopy  10/12/2030   Influenza Vaccine  Completed   Bone Density Scan  Completed   Hepatitis C Screening  Completed   Zoster Vaccines- Shingrix  Completed   Meningococcal B Vaccine  Aged Out        Assessment/Plan:  This is a routine wellness examination for Megan Allen.  Patient Care Team: Delbert Clam, MD as PCP - General (Family Medicine) Shaaron, Lamar HERO, MD as Consulting Physician (Gastroenterology)  I have personally reviewed and noted the following in the patients chart:   Medical and social history Use of alcohol, tobacco or illicit drugs  Current medications and supplements including opioid prescriptions. Functional ability and status Nutritional status Physical activity Advanced directives  List of other physicians Hospitalizations, surgeries, and ER visits in previous 12 months Vitals Screenings to include cognitive, depression, and falls Referrals and appointments  No orders of the defined types were placed in this encounter.  In addition, I have reviewed and discussed with patient certain preventive protocols, quality metrics, and best practice recommendations. A written personalized care plan for preventive services as well as general preventive health recommendations were provided to patient.   Roz LOISE Fuller, LPN   [id number]   Return in about 1 year (around 10/15/2025) for Medicare wellness.  After Visit Summary: (MyChart) Due to this being a telephonic visit, the after visit summary with patients personalized plan was offered to patient via MyChart   Nurse Notes: Patient is current and up to date with care gaps.  Patient due for Pneumonia and Covid-19 vaccines. "

## 2024-11-08 ENCOUNTER — Other Ambulatory Visit: Payer: Self-pay | Admitting: Family Medicine

## 2025-01-08 ENCOUNTER — Ambulatory Visit: Admitting: Family Medicine
